# Patient Record
Sex: Male | Born: 1960
Health system: Southern US, Community
[De-identification: ages and names within clinical notes are randomized; demographics above are authoritative.]

## PROBLEM LIST (undated history)

## (undated) DIAGNOSIS — M5126 Other intervertebral disc displacement, lumbar region: Secondary | ICD-10-CM

## (undated) DIAGNOSIS — F329 Major depressive disorder, single episode, unspecified: Secondary | ICD-10-CM

## (undated) DIAGNOSIS — F419 Anxiety disorder, unspecified: Secondary | ICD-10-CM

## (undated) DIAGNOSIS — N189 Chronic kidney disease, unspecified: Secondary | ICD-10-CM

## (undated) DIAGNOSIS — I351 Nonrheumatic aortic (valve) insufficiency: Secondary | ICD-10-CM

## (undated) DIAGNOSIS — M1990 Unspecified osteoarthritis, unspecified site: Secondary | ICD-10-CM

## (undated) DIAGNOSIS — I7121 Aneurysm of the ascending aorta, without rupture: Secondary | ICD-10-CM

## (undated) DIAGNOSIS — M51369 Other intervertebral disc degeneration, lumbar region without mention of lumbar back pain or lower extremity pain: Secondary | ICD-10-CM

## (undated) DIAGNOSIS — F32A Depression, unspecified: Secondary | ICD-10-CM

## (undated) DIAGNOSIS — I712 Thoracic aortic aneurysm, without rupture: Secondary | ICD-10-CM

## (undated) DIAGNOSIS — G629 Polyneuropathy, unspecified: Secondary | ICD-10-CM

## (undated) DIAGNOSIS — L98499 Non-pressure chronic ulcer of skin of other sites with unspecified severity: Secondary | ICD-10-CM

## (undated) DIAGNOSIS — R200 Anesthesia of skin: Secondary | ICD-10-CM

## (undated) DIAGNOSIS — E119 Type 2 diabetes mellitus without complications: Secondary | ICD-10-CM

## (undated) DIAGNOSIS — E049 Nontoxic goiter, unspecified: Secondary | ICD-10-CM

## (undated) DIAGNOSIS — T7840XA Allergy, unspecified, initial encounter: Secondary | ICD-10-CM

## (undated) DIAGNOSIS — M5136 Other intervertebral disc degeneration, lumbar region: Secondary | ICD-10-CM

## (undated) DIAGNOSIS — S8991XA Unspecified injury of right lower leg, initial encounter: Secondary | ICD-10-CM

## (undated) DIAGNOSIS — I10 Essential (primary) hypertension: Secondary | ICD-10-CM

## (undated) DIAGNOSIS — C801 Malignant (primary) neoplasm, unspecified: Secondary | ICD-10-CM

## (undated) DIAGNOSIS — E785 Hyperlipidemia, unspecified: Secondary | ICD-10-CM

## (undated) DIAGNOSIS — G4730 Sleep apnea, unspecified: Secondary | ICD-10-CM

## (undated) DIAGNOSIS — G709 Myoneural disorder, unspecified: Secondary | ICD-10-CM

## (undated) DIAGNOSIS — N2889 Other specified disorders of kidney and ureter: Secondary | ICD-10-CM

## (undated) DIAGNOSIS — M79673 Pain in unspecified foot: Secondary | ICD-10-CM

## (undated) HISTORY — DX: Other intervertebral disc degeneration, lumbar region without mention of lumbar back pain or lower extremity pain: M51.369

## (undated) HISTORY — DX: Unspecified injury of right lower leg, initial encounter: S89.91XA

## (undated) HISTORY — DX: Thoracic aortic aneurysm, without rupture: I71.2

## (undated) HISTORY — DX: Nontoxic goiter, unspecified: E04.9

## (undated) HISTORY — DX: Myoneural disorder, unspecified: G70.9

## (undated) HISTORY — DX: Allergy, unspecified, initial encounter: T78.40XA

## (undated) HISTORY — DX: Type 2 diabetes mellitus without complications: E11.9

## (undated) HISTORY — DX: Hyperlipidemia, unspecified: E78.5

## (undated) HISTORY — DX: Polyneuropathy, unspecified: G62.9

## (undated) HISTORY — DX: Essential (primary) hypertension: I10

## (undated) HISTORY — DX: Other intervertebral disc degeneration, lumbar region: M51.36

## (undated) HISTORY — DX: Other intervertebral disc displacement, lumbar region: M51.26

## (undated) HISTORY — DX: Unspecified osteoarthritis, unspecified site: M19.90

## (undated) HISTORY — DX: Malignant (primary) neoplasm, unspecified: C80.1

## (undated) HISTORY — DX: Nonrheumatic aortic (valve) insufficiency: I35.1

## (undated) HISTORY — DX: Aneurysm of the ascending aorta, without rupture: I71.21

---

## 2002-06-01 ENCOUNTER — Encounter: Payer: Self-pay | Admitting: Family Medicine

## 2002-06-01 ENCOUNTER — Ambulatory Visit (HOSPITAL_COMMUNITY): Admission: RE | Admit: 2002-06-01 | Discharge: 2002-06-01 | Payer: Self-pay | Admitting: Family Medicine

## 2004-09-17 ENCOUNTER — Ambulatory Visit (HOSPITAL_COMMUNITY): Admission: RE | Admit: 2004-09-17 | Discharge: 2004-09-17 | Payer: Self-pay | Admitting: Family Medicine

## 2007-02-02 ENCOUNTER — Emergency Department (HOSPITAL_COMMUNITY): Admission: EM | Admit: 2007-02-02 | Discharge: 2007-02-02 | Payer: Self-pay | Admitting: Emergency Medicine

## 2007-08-19 ENCOUNTER — Ambulatory Visit (HOSPITAL_COMMUNITY): Admission: RE | Admit: 2007-08-19 | Discharge: 2007-08-19 | Payer: Self-pay | Admitting: Family Medicine

## 2012-10-15 ENCOUNTER — Ambulatory Visit (INDEPENDENT_AMBULATORY_CARE_PROVIDER_SITE_OTHER): Payer: 59 | Admitting: General Practice

## 2012-10-15 ENCOUNTER — Encounter: Payer: Self-pay | Admitting: General Practice

## 2012-10-15 VITALS — BP 162/110 | HR 98 | Temp 98.4°F | Ht 77.0 in | Wt 352.0 lb

## 2012-10-15 DIAGNOSIS — I10 Essential (primary) hypertension: Secondary | ICD-10-CM

## 2012-10-15 LAB — POCT CBC
Hemoglobin: 15.9 g/dL (ref 14.1–18.1)
MCH, POC: 33.1 pg — AB (ref 27–31.2)
MCV: 91.1 fL (ref 80–97)
RBC: 4.8 M/uL (ref 4.69–6.13)

## 2012-10-15 MED ORDER — LABETALOL HCL 200 MG PO TABS
200.0000 mg | ORAL_TABLET | Freq: Two times a day (BID) | ORAL | Status: DC
Start: 2012-10-15 — End: 2013-06-01

## 2012-10-15 MED ORDER — LISINOPRIL 40 MG PO TABS: 40.0000 mg | ORAL_TABLET | Freq: Every day | ORAL | Status: DC

## 2012-10-15 MED ORDER — DILTIAZEM HCL ER BEADS 120 MG PO CP24: 120.0000 mg | ORAL_CAPSULE | Freq: Every day | ORAL | Status: DC

## 2012-10-15 NOTE — Patient Instructions (Addendum)

## 2012-10-15 NOTE — Progress Notes (Signed)
  Subjective:    Patient ID: Anthony Logan, male    DOB: 29-Dec-1960, 52 y.o.   MRN: [id number]  HPI Patient presents today for blood pressure check. Reports having hypertension since being a teenager. Reports his hypertension has been controlled, but unsure what medications he was taking. He [facility] until three months ago. Reports he was prescribed diltiazem 120mg  one tablet daily, linsinopril 40 mg one tablet daily, and labetalol 200mg  twice a day through the [facility] clinic and blood pressure was controlled. He reports taking only the lisinopril for past month, he ran out of other two medications. He has had difficulty finding a physician since finding a job.Review of Systems  Constitutional: Negative for fever and chills.  HENT: Negative for ear pain and neck pain.   Eyes: Negative for pain.  Respiratory: Negative for chest tightness, shortness of breath and wheezing.   Cardiovascular: Negative for chest pain and palpitations.  Gastrointestinal: Negative for abdominal pain.  Genitourinary: Negative for hematuria and difficulty urinating.  Musculoskeletal: Negative for back pain.  Skin: Negative.   Neurological: Negative for dizziness, syncope, weakness and headaches.  Psychiatric/Behavioral: Negative.        Objective:   Physical Exam  Constitutional: He is oriented to person, place, and time. He appears well-developed and well-nourished.  HENT:  Head: Normocephalic and atraumatic.  Eyes: EOM are normal.  Neck: Normal range of motion. No thyromegaly present.  Cardiovascular: Normal rate, regular rhythm and normal heart sounds.   No murmur heard. Pulmonary/Chest: Effort normal and breath sounds normal. No respiratory distress. He exhibits no tenderness.  Abdominal: Soft. Bowel sounds are normal.  Obese abdomen   Neurological: He is alert and oriented to person, place, and time.  Skin: Skin is warm and dry.  Psychiatric: He has a normal mood and affect.           Assessment & Plan:  1. Essential hypertension, benign - POCT CBC - COMPLETE METABOLIC PANEL WITH GFR - lisinopril (PRINIVIL,ZESTRIL) 40 MG tablet; Take 1 tablet (40 mg total) by mouth daily.  Dispense: 30 tablet; Refill: 3 - labetalol (NORMODYNE) 200 MG tablet; Take 1 tablet (200 mg total) by mouth 2 (two) times daily.  Dispense: 60 tablet; Refill: 3 - diltiazem (TIAZAC) 120 MG 24 hr capsule; Take 1 capsule (120 mg total) by mouth daily.  Dispense: 30 capsule; Refill: 3 -discussed weight reduction -discussed healthy eating habits -discussed importance of taking medications daily and prevention of being without meds -Maintain blood pressure diary -RTO in one week for Blood pressure recheck -Patient verbalized understanding -Coralie Keens, FNP-C

## 2012-10-16 LAB — COMPLETE METABOLIC PANEL WITH GFR
BUN: 20 mg/dL (ref 6–23)
CO2: 30 mEq/L (ref 19–32)
Calcium: 9.5 mg/dL (ref 8.4–10.5)
Chloride: 104 mEq/L (ref 96–112)
Creat: 1.06 mg/dL (ref 0.50–1.35)
GFR, Est African American: >89 mL/min

## 2013-02-08 ENCOUNTER — Ambulatory Visit: Payer: Self-pay | Admitting: Cardiology

## 2013-02-15 ENCOUNTER — Ambulatory Visit (INDEPENDENT_AMBULATORY_CARE_PROVIDER_SITE_OTHER): Payer: Self-pay | Admitting: Cardiology

## 2013-02-15 ENCOUNTER — Encounter: Payer: Self-pay | Admitting: Cardiology

## 2013-02-15 VITALS — BP 139/85 | HR 65 | Ht 77.0 in | Wt 369.0 lb

## 2013-02-15 DIAGNOSIS — E1169 Type 2 diabetes mellitus with other specified complication: Secondary | ICD-10-CM | POA: Insufficient documentation

## 2013-02-15 DIAGNOSIS — I10 Essential (primary) hypertension: Secondary | ICD-10-CM

## 2013-02-15 DIAGNOSIS — R9431 Abnormal electrocardiogram [ECG] [EKG]: Secondary | ICD-10-CM

## 2013-02-15 DIAGNOSIS — E1159 Type 2 diabetes mellitus with other circulatory complications: Secondary | ICD-10-CM | POA: Insufficient documentation

## 2013-02-15 DIAGNOSIS — I152 Hypertension secondary to endocrine disorders: Secondary | ICD-10-CM | POA: Insufficient documentation

## 2013-02-15 DIAGNOSIS — E782 Mixed hyperlipidemia: Secondary | ICD-10-CM

## 2013-02-15 DIAGNOSIS — R072 Precordial pain: Secondary | ICD-10-CM | POA: Insufficient documentation
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
## 2013-02-15 NOTE — Patient Instructions (Addendum)
Your physician recommends that you schedule a follow-up appointment in: 6 MONTHS  Your physician has requested that you have an echocardiogram. Echocardiography is a painless test that uses sound waves to create images of your heart. It provides your doctor with information about the size and shape of your heart and how well your heart's chambers and valves are working. This procedure takes approximately one hour. There are no restrictions for this procedure.  WE WILL CALL YOU WITH YOUR TEST RESULTS/INSTRUCTIONS/NEXT STEPS ONCE RECEIVED BY THE PROVIDER

## 2013-02-15 NOTE — Assessment & Plan Note (Signed)
Presently resolved. Both typical and atypical features described. At this point our plan is to continue medical therapy and observation.

## 2013-02-15 NOTE — Assessment & Plan Note (Signed)
Patient back on labetalol and other standing regimen. Keep followup at the [facility]. Weight loss and diet would also be beneficial.

## 2013-02-15 NOTE — Assessment & Plan Note (Addendum)
The patient is also on omega-3 supplements and Trilipix. Recently started. Recent LDL under 100.

## 2013-02-15 NOTE — Assessment & Plan Note (Signed)
In conjunction with cardiac risk factor profile and transient chest pain symptoms, possibility of underlying CAD is certainly to be considered. We did discuss this today. He reports a reassuring stress test in 2006, report not available. Plan will be to go ahead and proceed with an echocardiogram to assess cardiac structure and function, see if there is an inferior wall motion abnormality to correspond with prior infarct pattern by ECG.

## 2013-02-22 ENCOUNTER — Encounter: Payer: Self-pay | Admitting: Cardiology

## 2013-02-23 NOTE — Addendum Note (Signed)
Addended by: Thompson Grayer on: 02/23/2013 10:16 AM   Modules accepted: Orders

## 2013-04-21 ENCOUNTER — Telehealth: Payer: Self-pay | Admitting: *Deleted

## 2013-04-21 NOTE — Telephone Encounter (Signed)
WUJ:[id number] pt was to be set up with [facility] discount/free services based on his income in order to have Echo performed as advised by Dr. Diona Browner  This nurse contacted pt to get an update, pt advised he has been trying for several months now to get [facility] assistance however he has not heard anything, pt did clarify he spoke to [facility] as advised by this nurse at last OV when the pt noted he could not afford the test advised, this nurse contacted Lubertha Basque to receive an update and was advised the pt has completed the paperwork however the dept rep that has taken over the processing has not processed at this time, Kathie Rhodes noted she will email the rep again today to put a rush on this pt paperwork per this pt did complete his part, this nurse called pt to apologize for delay and that someone will be contacting him within the next week, pt understood

## 2013-04-22 NOTE — Telephone Encounter (Signed)
Lubertha Basque called to advise the pt paperwork was processed and the pt has been approved for 100% of the [facility] discount, this nurse contacted the pt to advise he was approved however he will be contacted with further details concerning what the 100% detail entails at a later time however I can advise the echo can be scheduled at this time for the pt, advised once the apt has been scheduled we will contact him with the update, the pt understood, message sent to [facility] to schedule for pt

## 2013-04-25 NOTE — Telephone Encounter (Signed)
Pt apt was scheduled for 05-09-13 at 1pm for his echo, pt understood all instructions and is aware that we will call him with his results once completed, pt understood

## 2013-05-09 ENCOUNTER — Ambulatory Visit (HOSPITAL_COMMUNITY)
Admission: RE | Admit: 2013-05-09 | Discharge: 2013-05-09 | Disposition: A | Discharge status: Home / Self Care | Payer: Self-pay | Source: Ambulatory Visit | Attending: Cardiology | Admitting: Cardiology

## 2013-05-09 DIAGNOSIS — I10 Essential (primary) hypertension: Secondary | ICD-10-CM | POA: Insufficient documentation

## 2013-05-09 DIAGNOSIS — I517 Cardiomegaly: Secondary | ICD-10-CM

## 2013-05-09 DIAGNOSIS — Z87891 Personal history of nicotine dependence: Secondary | ICD-10-CM | POA: Insufficient documentation

## 2013-05-09 DIAGNOSIS — R072 Precordial pain: Secondary | ICD-10-CM

## 2013-05-09 DIAGNOSIS — R079 Chest pain, unspecified: Secondary | ICD-10-CM | POA: Insufficient documentation

## 2013-05-09 DIAGNOSIS — E782 Mixed hyperlipidemia: Secondary | ICD-10-CM

## 2013-05-09 DIAGNOSIS — R9431 Abnormal electrocardiogram [ECG] [EKG]: Secondary | ICD-10-CM

## 2013-05-09 DIAGNOSIS — E785 Hyperlipidemia, unspecified: Secondary | ICD-10-CM | POA: Insufficient documentation

## 2013-05-09 NOTE — Progress Notes (Signed)
*  PRELIMINARY RESULTS* Echocardiogram 2D Echocardiogram has been performed.  Anthony Logan 05/09/2013, 1:48 PM

## 2013-05-23 ENCOUNTER — Encounter (HOSPITAL_COMMUNITY): Payer: Self-pay | Admitting: Emergency Medicine

## 2013-05-23 ENCOUNTER — Emergency Department (HOSPITAL_COMMUNITY): Payer: Self-pay

## 2013-05-23 ENCOUNTER — Emergency Department (HOSPITAL_COMMUNITY)
Admission: EM | Admit: 2013-05-23 | Discharge: 2013-05-23 | Disposition: A | Discharge status: Home / Self Care | Payer: Self-pay | Attending: Emergency Medicine | Admitting: Emergency Medicine

## 2013-05-23 DIAGNOSIS — Z8639 Personal history of other endocrine, nutritional and metabolic disease: Secondary | ICD-10-CM | POA: Insufficient documentation

## 2013-05-23 DIAGNOSIS — J069 Acute upper respiratory infection, unspecified: Secondary | ICD-10-CM | POA: Insufficient documentation

## 2013-05-23 DIAGNOSIS — Z87891 Personal history of nicotine dependence: Secondary | ICD-10-CM | POA: Insufficient documentation

## 2013-05-23 DIAGNOSIS — I10 Essential (primary) hypertension: Secondary | ICD-10-CM | POA: Insufficient documentation

## 2013-05-23 DIAGNOSIS — Z791 Long term (current) use of non-steroidal anti-inflammatories (NSAID): Secondary | ICD-10-CM | POA: Insufficient documentation

## 2013-05-23 DIAGNOSIS — Z862 Personal history of diseases of the blood and blood-forming organs and certain disorders involving the immune mechanism: Secondary | ICD-10-CM | POA: Insufficient documentation

## 2013-05-23 DIAGNOSIS — J9801 Acute bronchospasm: Secondary | ICD-10-CM | POA: Insufficient documentation

## 2013-05-23 DIAGNOSIS — Z87828 Personal history of other (healed) physical injury and trauma: Secondary | ICD-10-CM | POA: Insufficient documentation

## 2013-05-23 DIAGNOSIS — Z7982 Long term (current) use of aspirin: Secondary | ICD-10-CM | POA: Insufficient documentation

## 2013-05-23 DIAGNOSIS — Z79899 Other long term (current) drug therapy: Secondary | ICD-10-CM | POA: Insufficient documentation

## 2013-05-23 MED ORDER — PREDNISONE 20 MG PO TABS: ORAL_TABLET | ORAL | Status: DC

## 2013-05-23 MED ORDER — PREDNISONE 50 MG PO TABS
60.0000 mg | ORAL_TABLET | Freq: Once | ORAL | Status: AC
  Administered 2013-05-23: 60 mg via ORAL
  Filled 2013-05-23 (×2): qty 1

## 2013-05-23 MED ORDER — ALBUTEROL SULFATE HFA 108 (90 BASE) MCG/ACT IN AERS
4.0000 | INHALATION_SPRAY | Freq: Once | RESPIRATORY_TRACT | Status: AC
  Administered 2013-05-23: 4 via RESPIRATORY_TRACT
  Filled 2013-05-23: qty 6.7

## 2013-05-23 MED ORDER — ALBUTEROL SULFATE HFA 108 (90 BASE) MCG/ACT IN AERS: 2.0000 | INHALATION_SPRAY | RESPIRATORY_TRACT | Status: DC | PRN

## 2013-05-23 NOTE — Discharge Instructions (Signed)
Bronchospasm, Adult A bronchospasm is when the tubes that carry air in and out of your lungs (airwarys) spasm or tighten. During a bronchospasm it is hard to breathe. This is because the airways get smaller. A bronchospasm can be triggered by:  Allergies. These may be to animals, pollen, food, or mold.  Infection. This is a common cause of bronchospasm.  Exercise.  Irritants. These include pollution, cigarette smoke, strong odors, aerosol sprays, and paint fumes.  Weather changes.  Stress.  Being emotional. HOME CARE   Always have a plan for getting help. Know when to call your doctor and local emergency services (911 in the [place] Know where you can get emergency care.  Only take medicines as told by your doctor.  If you were prescribed an inhaler or nebulizer machine, ask your doctor how to use it correctly. Always use a spacer with your inhaler if you were given one.  Stay calm during an attack. Try to relax and breathe more slowly.  Control your home environment:  Change your heating and air conditioning filter at least once a month.  Limit your use of fireplaces and wood stoves.  Do not  smoke. Do not  allow smoking in your home.  Avoid perfumes and fragrances.  Get rid of pests (such as roaches and mice) and their droppings.  Throw away plants if you see mold on them.  Keep your house clean and dust free.  Replace carpet with wood, tile, or vinyl flooring. Carpet can trap dander and dust.  Use allergy-proof pillows, mattress covers, and box spring covers.  Wash bed sheets and blankets every week in hot water. Dry them in a dryer.  Use blankets that are made of polyester or cotton.  Wash hands frequently. GET HELP IF:  You have muscle aches.  You have chest pain.  The thick spit you spit or cough up (sputum) changes from clear or white to yellow, green, gray, or bloody.  The thick spit you spit or cough up gets thicker.  There are problems that may be  related to the medicine you are given such as:  A rash.  Itching.  Swelling.  Trouble breathing. GET HELP RIGHT AWAY IF:  You feel you cannot breathe or catch your breath.  You cannot stop coughing.  Your treatment is not helping you breathe better. MAKE SURE YOU:   Understand these instructions.  Will watch your condition.  Will get help right away if you are not doing well or get worse. Document Released: 03/02/2009 Document Revised: 01/05/2013 Document Reviewed: 10/26/2012 [facility] Patient Information 2014 [place]. You appear to have an upper respiratory infection (URI). An upper respiratory tract infection, or cold, is a viral infection of the air passages leading to the lungs. It is contagious and can be spread to others, especially during the first 3 or 4 days. It cannot be cured by antibiotics or other medicines. RETURN IMMEDIATELY IF you develop worse shortness of breath, confusion or altered mental status, a new rash, become dizzy, faint, or poorly responsive, or are unable to be cared for at home.
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
## 2013-05-23 NOTE — ED Notes (Signed)
Cough, with sharp pains in chest with cough,  Dark green sputum. Recent treatment with amoxicillin for URI,  Chills, no fever

## 2013-06-01 ENCOUNTER — Ambulatory Visit (INDEPENDENT_AMBULATORY_CARE_PROVIDER_SITE_OTHER): Payer: Self-pay | Admitting: Cardiology

## 2013-06-01 ENCOUNTER — Encounter: Payer: Self-pay | Admitting: Cardiology

## 2013-06-01 ENCOUNTER — Encounter: Payer: Self-pay | Admitting: *Deleted

## 2013-06-01 VITALS — BP 166/98 | HR 62 | Ht 77.0 in | Wt 380.0 lb

## 2013-06-01 DIAGNOSIS — R9431 Abnormal electrocardiogram [ECG] [EKG]: Secondary | ICD-10-CM

## 2013-06-01 DIAGNOSIS — E782 Mixed hyperlipidemia: Secondary | ICD-10-CM

## 2013-06-01 DIAGNOSIS — I10 Essential (primary) hypertension: Secondary | ICD-10-CM

## 2013-06-01 DIAGNOSIS — I77810 Thoracic aortic ectasia: Secondary | ICD-10-CM

## 2013-06-01 NOTE — Assessment & Plan Note (Signed)
No recurring chest pain symptoms, image imited echocardiogram shows overall preserved LVEF without obvious cardiomyopathy. Continue medical therapy and observation.

## 2013-06-01 NOTE — Assessment & Plan Note (Signed)
Continuing with medication adjustments per primary care provider. Blood pressure is up this morning, but he has not yet taken his morning medications. Keep regular followup with Ms. McElroy.

## 2013-06-01 NOTE — Patient Instructions (Signed)
Your physician wants you to follow-up in: 6 MONTHS You will receive a reminder letter in the mail two months in advance. If you don't receive a letter, please call our office to schedule the follow-up appointment. 

## 2013-06-01 NOTE — Assessment & Plan Note (Signed)
He continues on omega-3 supplements and Trilipix. Recent lipid panel noted above.
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
## 2013-06-01 NOTE — Assessment & Plan Note (Signed)
Noted on recent screening echocardiogram, asymptomatic. At this point would focus on blood pressure control. Agree with beta blocker as a part of his regimen.

## 2014-01-05 ENCOUNTER — Other Ambulatory Visit (HOSPITAL_COMMUNITY): Payer: Self-pay | Admitting: Physician Assistant

## 2014-01-05 DIAGNOSIS — I10 Essential (primary) hypertension: Secondary | ICD-10-CM

## 2014-01-09 ENCOUNTER — Ambulatory Visit (HOSPITAL_COMMUNITY)
Admission: RE | Admit: 2014-01-09 | Discharge: 2014-01-09 | Disposition: A | Discharge status: Home / Self Care | Payer: Self-pay | Source: Ambulatory Visit | Attending: Physician Assistant | Admitting: Physician Assistant

## 2014-01-09 DIAGNOSIS — I10 Essential (primary) hypertension: Secondary | ICD-10-CM | POA: Insufficient documentation

## 2014-01-19 ENCOUNTER — Other Ambulatory Visit (HOSPITAL_COMMUNITY): Payer: Self-pay

## 2014-01-19 DIAGNOSIS — G4730 Sleep apnea, unspecified: Secondary | ICD-10-CM

## 2014-02-05 ENCOUNTER — Ambulatory Visit: Payer: Self-pay | Attending: Physician Assistant | Admitting: Sleep Medicine

## 2014-02-05 VITALS — Ht 77.0 in | Wt 370.0 lb

## 2014-02-05 DIAGNOSIS — G4733 Obstructive sleep apnea (adult) (pediatric): Secondary | ICD-10-CM | POA: Insufficient documentation

## 2014-02-10 NOTE — Sleep Study (Signed)
  Butler A. Merlene Laughter, MD     [email or web address]        NOCTURNAL POLYSOMNOGRAM    LOCATION: SLEEP LAB FACILITY: [facility]   PHYSICIAN: Morad Tal A. Merlene Laughter, M.D.   DATE OF STUDY: 02/05/2014.   REFERRING PHYSICIAN: Soyla Dryer, PA-C.  INDICATIONS: This is a 53 year old man who presents with snoring and witnessed apnea.  MEDICATIONS:  Prior to Admission medications   Medication Sig Start Date End Date Taking? Authorizing Provider  albuterol (PROVENTIL HFA;VENTOLIN HFA) 108 (90 BASE) MCG/ACT inhaler Inhale 2 puffs into the lungs every 2 (two) hours as needed for wheezing or shortness of breath (cough). 05/23/13   Babette Relic, MD  aspirin 81 MG tablet Take 81 mg by mouth daily.    Historical Provider, MD  Choline Fenofibrate (TRILIPIX) 135 MG capsule Take 135 mg by mouth daily.    Historical Provider, MD  diltiazem (TIAZAC) 120 MG 24 hr capsule Take 1 capsule (120 mg total) by mouth daily. 10/15/12   Erby Pian, FNP  fish oil-omega-3 fatty acids 1000 MG capsule Take 2 g by mouth daily.    Historical Provider, MD  furosemide (LASIX) 20 MG tablet Take 20 mg by mouth 2 (two) times daily.    Historical Provider, MD  lisinopril (PRINIVIL,ZESTRIL) 40 MG tablet Take 1 tablet (40 mg total) by mouth daily. 10/15/12   Erby Pian, FNP  meloxicam (MOBIC) 15 MG tablet Take 15 mg by mouth daily.    Historical Provider, MD  metoprolol (LOPRESSOR) 100 MG tablet Take 100 mg by mouth. 2 tabs bid    Historical Provider, MD  Multiple Vitamins-Iron (MULTIVITAMINS WITH IRON) TABS Take 1 tablet by mouth daily.    Historical Provider, MD  predniSONE (DELTASONE) 20 MG tablet 2 tabs po daily x 4 days 05/23/13   Babette Relic, MD      EPWORTH SLEEPINESS SCALE: 17.   BMI: 44.   ARCHITECTURAL SUMMARY: Total recording time was 449 minutes. Sleep efficiency 66 %. Sleep latency 53 minutes. REM latency 171 minutes. Stage NI 16 %, N2 50 % and N3 21 % and REM sleep 12 %.     RESPIRATORY DATA:  This is a split-night recording with the initial portion been a diagnostic in the second portion a titration recording. Baseline oxygen saturation is 95 %. The lowest saturation is 66 %. The diagnostic AHI is 77. The patient was placed on positive pressure starting at 5 and increase to 10. Optimal pressure is 10 with resolution of obstructive events and good tolerance.   LIMB MOVEMENT SUMMARY: PLM index 0.   ELECTROCARDIOGRAM SUMMARY: Average heart rate is 59 with no significant dysrhythmias observed.   IMPRESSION:  1. Severe obstructive sleep apnea syndrome which responds well to the CPAP of 10.  Thanks for this referral.  Julieana Eshleman A. Merlene Laughter, M.D. Diplomat, Tax adviser of Sleep Medicine.

## 2014-02-28 ENCOUNTER — Telehealth: Payer: Self-pay | Admitting: *Deleted

## 2014-02-28 NOTE — Telephone Encounter (Signed)
Received labs in Dr. McDowell folder. 

## 2014-03-06 ENCOUNTER — Encounter: Payer: Self-pay | Admitting: Cardiology

## 2014-03-16 ENCOUNTER — Ambulatory Visit: Payer: Self-pay | Admitting: Cardiology

## 2014-04-04 ENCOUNTER — Ambulatory Visit (INDEPENDENT_AMBULATORY_CARE_PROVIDER_SITE_OTHER): Payer: Self-pay | Admitting: Cardiology

## 2014-04-04 ENCOUNTER — Encounter: Payer: Self-pay | Admitting: Cardiology

## 2014-04-04 VITALS — BP 228/110 | HR 61 | Ht 77.0 in | Wt 387.0 lb

## 2014-04-04 DIAGNOSIS — I10 Essential (primary) hypertension: Secondary | ICD-10-CM

## 2014-04-04 DIAGNOSIS — I712 Thoracic aortic aneurysm, without rupture: Secondary | ICD-10-CM

## 2014-04-04 DIAGNOSIS — E782 Mixed hyperlipidemia: Secondary | ICD-10-CM

## 2014-04-04 DIAGNOSIS — I77810 Thoracic aortic ectasia: Secondary | ICD-10-CM

## 2014-04-04 MED ORDER — HYDRALAZINE HCL 25 MG PO TABS: 25.0000 mg | ORAL_TABLET | Freq: Three times a day (TID) | ORAL | Status: DC

## 2014-04-04 NOTE — Assessment & Plan Note (Signed)
Blood pressure not well controlled. We discussed diet and weight loss. Optimally, if he can receive regular CPAP treatment for sleep apnea, his blood pressure would probably also come under better control. We are starting hydralazine 25 mg 3 times a day, this can be up titrated further at subsequent visits in the [facility] for additional blood pressure control. No changes made to his other regimen.
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
## 2014-04-04 NOTE — Assessment & Plan Note (Signed)
Recent LDL 58.

## 2014-04-04 NOTE — Patient Instructions (Signed)
Your physician recommends that you schedule a follow-up appointment in: 6 months with Dr. Domenic Polite  Your physician has recommended you make the following change in your medication:   START HYDRALAZINE 25 [place] physician has requested that you have an echocardiogram. Echocardiography is a painless test that uses sound waves to create images of your heart. It provides your doctor with information about the size and shape of your heart and how well your heart's chambers and valves are working. This procedure takes approximately one hour. There are no restrictions for this procedure.  Thank you for choosing [facility]!!

## 2014-04-04 NOTE — Assessment & Plan Note (Signed)
Needs to focus on better blood pressure control. We will obtain a follow-up echocardiogram for his next visit in 6 months.

## 2014-04-06 ENCOUNTER — Ambulatory Visit (HOSPITAL_COMMUNITY)
Admission: RE | Admit: 2014-04-06 | Discharge: 2014-04-06 | Disposition: A | Discharge status: Home / Self Care | Payer: Self-pay | Source: Ambulatory Visit | Attending: Cardiology | Admitting: Cardiology

## 2014-04-06 DIAGNOSIS — I10 Essential (primary) hypertension: Secondary | ICD-10-CM | POA: Insufficient documentation

## 2014-04-06 DIAGNOSIS — G4733 Obstructive sleep apnea (adult) (pediatric): Secondary | ICD-10-CM | POA: Insufficient documentation

## 2014-04-06 DIAGNOSIS — Z87891 Personal history of nicotine dependence: Secondary | ICD-10-CM | POA: Insufficient documentation

## 2014-04-06 DIAGNOSIS — I359 Nonrheumatic aortic valve disorder, unspecified: Secondary | ICD-10-CM

## 2014-04-06 DIAGNOSIS — I77810 Thoracic aortic ectasia: Secondary | ICD-10-CM

## 2014-04-06 DIAGNOSIS — E785 Hyperlipidemia, unspecified: Secondary | ICD-10-CM | POA: Insufficient documentation

## 2014-04-06 DIAGNOSIS — I083 Combined rheumatic disorders of mitral, aortic and tricuspid valves: Secondary | ICD-10-CM | POA: Insufficient documentation

## 2014-04-06 MED ORDER — PERFLUTREN LIPID MICROSPHERE
1.0000 mL | INTRAVENOUS | Status: AC | PRN
  Administered 2014-04-06: 3 mL via INTRAVENOUS
  Administered 2014-04-06 (×3): 2 mL via INTRAVENOUS
  Filled 2014-04-06: qty 10

## 2014-04-06 NOTE — Progress Notes (Signed)
  Echocardiogram 2D Echocardiogram with Definity has been performed.  [place], [place] 04/06/2014, 12:53 PM

## 2014-09-27 ENCOUNTER — Ambulatory Visit (HOSPITAL_COMMUNITY)
Admission: RE | Admit: 2014-09-27 | Discharge: 2014-09-27 | Disposition: A | Discharge status: Home / Self Care | Payer: Self-pay | Source: Ambulatory Visit | Attending: Physician Assistant | Admitting: Physician Assistant

## 2014-09-27 ENCOUNTER — Other Ambulatory Visit (HOSPITAL_COMMUNITY): Payer: Self-pay | Admitting: Physician Assistant

## 2014-09-27 DIAGNOSIS — I517 Cardiomegaly: Secondary | ICD-10-CM | POA: Insufficient documentation

## 2014-09-27 DIAGNOSIS — R609 Edema, unspecified: Secondary | ICD-10-CM | POA: Insufficient documentation

## 2014-09-27 DIAGNOSIS — R0602 Shortness of breath: Secondary | ICD-10-CM | POA: Insufficient documentation

## 2014-10-27 ENCOUNTER — Encounter: Payer: Self-pay | Admitting: Cardiology

## 2014-10-27 ENCOUNTER — Ambulatory Visit (INDEPENDENT_AMBULATORY_CARE_PROVIDER_SITE_OTHER): Payer: Self-pay | Admitting: Cardiology

## 2014-10-27 VITALS — BP 148/86 | HR 62 | Ht 77.0 in

## 2014-10-27 DIAGNOSIS — R600 Localized edema: Secondary | ICD-10-CM

## 2014-10-27 DIAGNOSIS — I77819 Aortic ectasia, unspecified site: Secondary | ICD-10-CM

## 2014-10-27 DIAGNOSIS — I10 Essential (primary) hypertension: Secondary | ICD-10-CM
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
## 2014-10-27 NOTE — Patient Instructions (Signed)
Your physician wants you to follow-up in: 6 months with Dr Ferne Reus will receive a reminder letter in the mail two months in advance. If you don't receive a letter, please call our office to schedule the follow-up appointment.    Your physician recommends that you continue on your current medications as directed. Please refer to the Current Medication list given to you today.    Your physician has requested that you have an echocardiogram JUST BEFORE FOLLOW UP VISIT IN [place]. Echocardiography is a painless test that uses sound waves to create images of your heart. It provides your doctor with information about the size and shape of your heart and how well your heart's chambers and valves are working. This procedure takes approximately one hour. There are no restrictions for this procedure.    Thank you for choosing [facility] !

## 2015-01-06 ENCOUNTER — Encounter (HOSPITAL_COMMUNITY): Payer: Self-pay | Admitting: Cardiology

## 2015-01-06 ENCOUNTER — Emergency Department (HOSPITAL_COMMUNITY)
Admission: EM | Admit: 2015-01-06 | Discharge: 2015-01-06 | Disposition: A | Discharge status: Home / Self Care | Payer: Self-pay | Attending: Emergency Medicine | Admitting: Emergency Medicine

## 2015-01-06 DIAGNOSIS — Z7982 Long term (current) use of aspirin: Secondary | ICD-10-CM | POA: Insufficient documentation

## 2015-01-06 DIAGNOSIS — L03211 Cellulitis of face: Secondary | ICD-10-CM | POA: Insufficient documentation

## 2015-01-06 DIAGNOSIS — Z87891 Personal history of nicotine dependence: Secondary | ICD-10-CM | POA: Insufficient documentation

## 2015-01-06 DIAGNOSIS — I10 Essential (primary) hypertension: Secondary | ICD-10-CM | POA: Insufficient documentation

## 2015-01-06 DIAGNOSIS — Z79899 Other long term (current) drug therapy: Secondary | ICD-10-CM | POA: Insufficient documentation

## 2015-01-06 DIAGNOSIS — Z87828 Personal history of other (healed) physical injury and trauma: Secondary | ICD-10-CM | POA: Insufficient documentation

## 2015-01-06 DIAGNOSIS — Z8639 Personal history of other endocrine, nutritional and metabolic disease: Secondary | ICD-10-CM | POA: Insufficient documentation

## 2015-01-06 MED ORDER — CEPHALEXIN 500 MG PO CAPS: 500.0000 mg | ORAL_CAPSULE | Freq: Four times a day (QID) | ORAL | Status: DC
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
## 2015-01-06 NOTE — ED Notes (Signed)
Pt reports red swollen area to left side of forehead that started occurring 4 days ago. Pt is unaware of being bitten by anything or having any injury to site. Redness and swelling extends to left eye and underneath bilateral eyes. Pt says the redness and swelling has only gotten worse since it started.

## 2015-01-06 NOTE — ED Notes (Signed)
Boil above left eye.  States his eye is starting to swell now.

## 2015-01-09 ENCOUNTER — Encounter (HOSPITAL_COMMUNITY): Payer: Self-pay | Admitting: *Deleted

## 2015-01-09 ENCOUNTER — Emergency Department (HOSPITAL_COMMUNITY)
Admission: EM | Admit: 2015-01-09 | Discharge: 2015-01-09 | Disposition: A | Discharge status: Home / Self Care | Payer: Self-pay | Attending: Emergency Medicine | Admitting: Emergency Medicine

## 2015-01-09 DIAGNOSIS — Z792 Long term (current) use of antibiotics: Secondary | ICD-10-CM | POA: Insufficient documentation

## 2015-01-09 DIAGNOSIS — Z791 Long term (current) use of non-steroidal anti-inflammatories (NSAID): Secondary | ICD-10-CM | POA: Insufficient documentation

## 2015-01-09 DIAGNOSIS — Z7982 Long term (current) use of aspirin: Secondary | ICD-10-CM | POA: Insufficient documentation

## 2015-01-09 DIAGNOSIS — Z87828 Personal history of other (healed) physical injury and trauma: Secondary | ICD-10-CM | POA: Insufficient documentation

## 2015-01-09 DIAGNOSIS — T798XXA Other early complications of trauma, initial encounter: Secondary | ICD-10-CM

## 2015-01-09 DIAGNOSIS — Z87891 Personal history of nicotine dependence: Secondary | ICD-10-CM | POA: Insufficient documentation

## 2015-01-09 DIAGNOSIS — E785 Hyperlipidemia, unspecified: Secondary | ICD-10-CM | POA: Insufficient documentation

## 2015-01-09 DIAGNOSIS — I10 Essential (primary) hypertension: Secondary | ICD-10-CM | POA: Insufficient documentation

## 2015-01-09 DIAGNOSIS — L089 Local infection of the skin and subcutaneous tissue, unspecified: Secondary | ICD-10-CM | POA: Insufficient documentation

## 2015-01-09 DIAGNOSIS — Z79899 Other long term (current) drug therapy: Secondary | ICD-10-CM | POA: Insufficient documentation

## 2015-01-09 MED ORDER — SULFAMETHOXAZOLE-TRIMETHOPRIM 800-160 MG PO TABS: 1.0000 | ORAL_TABLET | Freq: Two times a day (BID) | ORAL | Status: AC

## 2015-01-09 NOTE — Discharge Instructions (Signed)
Use moist heat on the sore area every hour, for 30 minutes while awake. Return here if needed for increased swelling, pain, nausea, vomiting or inability to take the medications.   Wound Infection A wound infection happens when a type of germ (bacteria) starts growing in the wound. In some cases, this can cause the wound to break open. If cared for properly, the infected wound will heal from the inside to the outside. Wound infections need treatment. CAUSES An infection is caused by bacteria growing in the wound.  SYMPTOMS   Increase in redness, swelling, or pain at the wound site.  Increase in drainage at the wound site.  Wound or bandage (dressing) starts to smell bad.  Fever.  Feeling tired or fatigued.  Pus draining from the wound. TREATMENT  Your health care provider will prescribe antibiotic medicine. The wound infection should improve within 24 to 48 hours. Any redness around the wound should stop spreading and the wound should be less painful.  HOME CARE INSTRUCTIONS   Only take over-the-counter or prescription medicines for pain, discomfort, or fever as directed by your health care provider.  Take your antibiotics as directed. Finish them even if you start to feel better.  Gently wash the area with mild soap and water 2 times a day, or as directed. Rinse off the soap. Pat the area dry with a clean towel. Do not rub the wound. This may cause bleeding.  Follow your health care provider's instructions for how often you need to change the dressing.  Apply ointment and a dressing to the wound as directed.  If the dressing sticks, moisten it with soapy water and gently remove it.  Change the bandage right away if it becomes wet, dirty, or develops a bad smell.  Take showers. Do not take tub baths, swim, or do anything that may soak the wound until it is healed.  Avoid exercises that make you sweat heavily.  Use anti-itch medicine as directed by your health care provider.  The wound may itch when it is healing. Do not pick or scratch at the wound.  Follow up with your health care provider to get your wound rechecked as directed. SEEK MEDICAL CARE IF:  You have an increase in swelling, pain, or redness around the wound.  You have an increase in the amount of pus coming from the wound.  There is a bad smell coming from the wound.  More of the wound breaks open.  You have a fever. MAKE SURE YOU:   Understand these instructions.  Will watch your condition.  Will get help right away if you are not doing well or get worse. Document Released: 02/01/2003 Document Revised: 05/10/2013 Document Reviewed: 09/08/2010 [facility] Patient Information 2015 [place], [place]. This information is not intended to replace advice given to you by your health care provider. Make sure you discuss any questions you have with your health care provider.

## 2015-01-09 NOTE — ED Notes (Signed)
Pt comes in for a re-check of cellulitis. Pt was seen here on 8/20 and given antibiotics. Pt states he feels he is getting no better. He woke up this morning with increased swelling. NAD noted. Pt airway is patent.
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
## 2015-03-27 ENCOUNTER — Encounter: Payer: Self-pay | Admitting: Physician Assistant

## 2015-03-27 ENCOUNTER — Ambulatory Visit: Payer: Self-pay | Admitting: Physician Assistant

## 2015-03-27 VITALS — BP 160/110 | HR 50 | Temp 97.3°F | Ht 77.0 in | Wt 367.5 lb

## 2015-03-27 DIAGNOSIS — R809 Proteinuria, unspecified: Secondary | ICD-10-CM | POA: Insufficient documentation

## 2015-03-27 DIAGNOSIS — I10 Essential (primary) hypertension: Secondary | ICD-10-CM

## 2015-03-27 DIAGNOSIS — E782 Mixed hyperlipidemia: Secondary | ICD-10-CM | POA: Insufficient documentation

## 2015-03-27 DIAGNOSIS — E785 Hyperlipidemia, unspecified: Secondary | ICD-10-CM

## 2015-03-27 NOTE — Progress Notes (Signed)
BP 160/110 mmHg  Pulse 50  Temp(Src) 97.3 F (36.3 C)  Ht 6\' 5"  (1.956 m)  Wt 367 lb 8 oz (166.697 kg)  BMI 43.57 kg/m2  SpO2 98%   Subjective:    Patient ID: Anthony Logan, male    DOB: 14-May-1961, 54 y.o.   MRN: [id number]  HPI: Anthony Logan is a 54 y.o. male presenting on 03/27/2015 for Hypertension   HPI Chief Complaint  Patient presents with  . Hypertension    pt states he feels pretty good.   Pt has separated from his wife and is now living with his parents again. He says he is not depressed and actually feels relieved. Pt has appt with neprhologist  Next week. Pt has appt for echo in december  Relevant past medical, surgical, family and social history reviewed and updated as indicated. Interim medical history since our last visit reviewed. Allergies and medications reviewed and updated.  Current outpatient prescriptions:  .  aspirin 81 MG tablet, Take 81 mg by mouth daily., Disp: , Rfl:  .  Cholecalciferol (VITAMIN D-3 PO), Take 1,000 Units by mouth daily., Disp: , Rfl:  .  fish oil-omega-3 fatty acids 1000 MG capsule, Take 2 g by mouth daily. 4 tabs daily, Disp: , Rfl:  .  furosemide (LASIX) 20 MG tablet, Take 60 mg by mouth daily. , Disp: , Rfl:  .  lisinopril (PRINIVIL,ZESTRIL) 40 MG tablet, Take 1 tablet (40 mg total) by mouth daily., Disp: 30 tablet, Rfl: 3 .  metoprolol (LOPRESSOR) 100 MG tablet, Take 200 mg by mouth 2 (two) times daily. 2 tabs bid, Disp: , Rfl:  .  Multiple Vitamin (MULTI VITAMIN DAILY PO), Take by mouth daily., Disp: , Rfl:  .  naproxen sodium (ANAPROX) 220 MG tablet, Take 220 mg by mouth daily as needed. , Disp: , Rfl:  .  potassium gluconate 595 MG TABS tablet, Take 595 mg by mouth daily. 2 tabs daily, Disp: , Rfl:    Review of Systems  Constitutional: Negative for fever, chills, diaphoresis, appetite change, fatigue and unexpected weight change.  HENT: Negative for congestion, dental problem, drooling, ear pain, facial swelling, hearing  loss, mouth sores, sneezing, sore throat, trouble swallowing and voice change.   Eyes: Negative for pain, discharge, redness, itching and visual disturbance.  Respiratory: Negative for cough, choking, shortness of breath and wheezing.   Cardiovascular: Negative for chest pain, palpitations and leg swelling.  Gastrointestinal: Negative for vomiting, abdominal pain, diarrhea, constipation and blood in stool.  Endocrine: Negative for cold intolerance, heat intolerance and polydipsia.  Genitourinary: Negative for dysuria, hematuria and decreased urine volume.  Musculoskeletal: Positive for arthralgias. Negative for back pain and gait problem.  Skin: Negative for rash.  Allergic/Immunologic: Negative for environmental allergies.  Neurological: Negative for seizures, syncope, light-headedness and headaches.  Hematological: Negative for adenopathy.  Psychiatric/Behavioral: Negative for suicidal ideas, dysphoric mood and agitation. The patient is not nervous/anxious.     Per HPI unless specifically indicated above     Objective:    BP 160/110 mmHg  Pulse 50  Temp(Src) 97.3 F (36.3 C)  Ht 6\' 5"  (1.956 m)  Wt 367 lb 8 oz (166.697 kg)  BMI 43.57 kg/m2  SpO2 98%  Wt Readings from Last 3 Encounters:  03/27/15 367 lb 8 oz (166.697 kg)  01/09/15 370 lb (167.831 kg)  01/06/15 372 lb (168.738 kg)    Physical Exam  Constitutional: He is oriented to person, place, and time. He appears well-developed  and well-nourished.  HENT:  Head: Normocephalic and atraumatic.  Neck: Neck supple.  Cardiovascular: Normal rate and regular rhythm.   Pulmonary/Chest: Effort normal and breath sounds normal. He has no wheezes.  Abdominal: Soft. Bowel sounds are normal. There is no tenderness.  obese  Musculoskeletal: He exhibits edema (trace to 1+ BLE edema).  Lymphadenopathy:    He has no cervical adenopathy.  Neurological: He is alert and oriented to person, place, and time.  Skin: Skin is warm and dry.   Psychiatric: He has a normal mood and affect. His behavior is normal. Thought content normal.  Vitals reviewed.       Assessment & Plan:    Encounter Diagnoses  Name Primary?  . Essential hypertension, benign Yes  . Proteinuria   . Hyperlipemia   . Morbid obesity, unspecified obesity type ([place])    No changes today. Cont with specialists. F/u here 3 mo. rto sooner prn

## 2015-03-28 ENCOUNTER — Other Ambulatory Visit: Payer: Self-pay | Admitting: Physician Assistant

## 2015-03-28 DIAGNOSIS — R809 Proteinuria, unspecified: Secondary | ICD-10-CM

## 2015-03-28 DIAGNOSIS — N183 Chronic kidney disease, stage 3 unspecified: Secondary | ICD-10-CM

## 2015-03-28 DIAGNOSIS — D649 Anemia, unspecified: Secondary | ICD-10-CM

## 2015-03-28 DIAGNOSIS — I10 Essential (primary) hypertension: Secondary | ICD-10-CM

## 2015-03-28 DIAGNOSIS — E559 Vitamin D deficiency, unspecified: Secondary | ICD-10-CM

## 2015-03-28 DIAGNOSIS — Z79899 Other long term (current) drug therapy: Secondary | ICD-10-CM

## 2015-03-30 LAB — HEMOGLOBIN: HEMOGLOBIN: 15.2 g/dL (ref 13.0–17.0)

## 2015-03-30 LAB — RENAL FUNCTION PANEL
Albumin: 4.1 g/dL (ref 3.6–5.1)
BUN: 12 mg/dL (ref 7–25)
CALCIUM: 9 mg/dL (ref 8.6–10.3)
CHLORIDE: 103 mmol/L (ref 98–110)
CO2: 29 mmol/L (ref 20–31)
Creat: 0.7 mg/dL (ref 0.70–1.33)
Glucose, Bld: 117 mg/dL — ABNORMAL HIGH (ref 65–99)
PHOSPHORUS: 3 mg/dL (ref 2.5–4.5)
POTASSIUM: 4.3 mmol/L (ref 3.5–5.3)
SODIUM: 144 mmol/L (ref 135–146)

## 2015-03-31 LAB — HEMATOCRIT: HCT: 46.2 % (ref 39.0–52.0)

## 2015-03-31 LAB — PROTEIN / CREATININE RATIO, URINE
CREATININE, URINE: 210 mg/dL (ref 20–370)
Protein Creatinine Ratio: 305 mg/g creat — ABNORMAL HIGH (ref 22–128)
Total Protein, Urine: 64 mg/dL — ABNORMAL HIGH (ref 5–25)

## 2015-03-31 LAB — VITAMIN D 25 HYDROXY (VIT D DEFICIENCY, FRACTURES): Vit D, 25-Hydroxy: 33 ng/mL (ref 30–100)

## 2015-04-19 ENCOUNTER — Telehealth: Payer: Self-pay | Admitting: Cardiology

## 2015-04-19 NOTE — Telephone Encounter (Signed)
Spoke w/pt.  He will be self pay for 04-23-15 echo.  Pt states denied for [employer] Financial Aid.  He is also applying for Medicaid.

## 2015-04-23 ENCOUNTER — Ambulatory Visit (HOSPITAL_COMMUNITY)
Admission: RE | Admit: 2015-04-23 | Discharge: 2015-04-23 | Disposition: A | Discharge status: Home / Self Care | Payer: Medicaid Other | Source: Ambulatory Visit | Attending: Cardiology | Admitting: Cardiology

## 2015-04-23 DIAGNOSIS — I77819 Aortic ectasia, unspecified site: Secondary | ICD-10-CM | POA: Diagnosis present

## 2015-04-23 DIAGNOSIS — R079 Chest pain, unspecified: Secondary | ICD-10-CM | POA: Diagnosis not present

## 2015-04-23 DIAGNOSIS — I10 Essential (primary) hypertension: Secondary | ICD-10-CM | POA: Insufficient documentation

## 2015-06-04 ENCOUNTER — Encounter (HOSPITAL_COMMUNITY): Payer: Self-pay | Admitting: *Deleted

## 2015-06-04 ENCOUNTER — Emergency Department (HOSPITAL_COMMUNITY)
Admission: EM | Admit: 2015-06-04 | Discharge: 2015-06-04 | Disposition: A | Discharge status: Home / Self Care | Payer: Medicaid Other | Attending: Emergency Medicine | Admitting: Emergency Medicine

## 2015-06-04 ENCOUNTER — Emergency Department (HOSPITAL_COMMUNITY): Payer: Medicaid Other

## 2015-06-04 DIAGNOSIS — I10 Essential (primary) hypertension: Secondary | ICD-10-CM | POA: Insufficient documentation

## 2015-06-04 DIAGNOSIS — Z87828 Personal history of other (healed) physical injury and trauma: Secondary | ICD-10-CM | POA: Diagnosis not present

## 2015-06-04 DIAGNOSIS — R109 Unspecified abdominal pain: Secondary | ICD-10-CM | POA: Diagnosis not present

## 2015-06-04 DIAGNOSIS — Z87891 Personal history of nicotine dependence: Secondary | ICD-10-CM | POA: Insufficient documentation

## 2015-06-04 DIAGNOSIS — Z79899 Other long term (current) drug therapy: Secondary | ICD-10-CM | POA: Insufficient documentation

## 2015-06-04 DIAGNOSIS — N2889 Other specified disorders of kidney and ureter: Secondary | ICD-10-CM | POA: Diagnosis not present

## 2015-06-04 DIAGNOSIS — Z7982 Long term (current) use of aspirin: Secondary | ICD-10-CM | POA: Diagnosis not present

## 2015-06-04 DIAGNOSIS — E785 Hyperlipidemia, unspecified: Secondary | ICD-10-CM | POA: Insufficient documentation

## 2015-06-04 DIAGNOSIS — M545 Low back pain: Secondary | ICD-10-CM | POA: Diagnosis present

## 2015-06-04 LAB — URINALYSIS, ROUTINE W REFLEX MICROSCOPIC
Bilirubin Urine: NEGATIVE
Glucose, UA: NEGATIVE mg/dL
Hgb urine dipstick: NEGATIVE
KETONES UR: NEGATIVE mg/dL
LEUKOCYTES UA: NEGATIVE
NITRITE: NEGATIVE
PH: 5 (ref 5.0–8.0)
Protein, ur: NEGATIVE mg/dL
Specific Gravity, Urine: 1.02 (ref 1.005–1.030)

## 2015-06-04 MED ORDER — OXYCODONE-ACETAMINOPHEN 5-325 MG PO TABS: 2.0000 | ORAL_TABLET | ORAL | Status: DC | PRN

## 2015-06-04 MED ORDER — OXYCODONE-ACETAMINOPHEN 5-325 MG PO TABS
2.0000 | ORAL_TABLET | Freq: Once | ORAL | Status: AC
  Administered 2015-06-04: 2 via ORAL
  Filled 2015-06-04: qty 2

## 2015-06-04 NOTE — Discharge Instructions (Signed)
Flank Pain Flank pain refers to pain that is located on the side of the body between the upper abdomen and the back. The pain may occur over a short period of time (acute) or may be long-term or reoccurring (chronic). It may be mild or severe. Flank pain can be caused by many things. CAUSES  Some of the more common causes of flank pain include:  Muscle strains.   Muscle spasms.   A disease of your spine (vertebral disk disease).   A lung infection (pneumonia).   Fluid around your lungs (pulmonary edema).   A kidney infection.   Kidney stones.   A very painful skin rash caused by the chickenpox virus (shingles).   Gallbladder disease.  [place] care will depend on the cause of your pain. In general,  Rest as directed by your caregiver.  Drink enough fluids to keep your urine clear or pale yellow.  Only take over-the-counter or prescription medicines as directed by your caregiver. Some medicines may help relieve the pain.  Tell your caregiver about any changes in your pain.  Follow up with your caregiver as directed. SEEK IMMEDIATE MEDICAL CARE IF:   Your pain is not controlled with medicine.   You have new or worsening symptoms.  Your pain increases.   You have abdominal pain.   You have shortness of breath.   You have persistent nausea or vomiting.   You have swelling in your abdomen.   You feel faint or pass out.   You have blood in your urine.  You have a fever or persistent symptoms for more than 2-3 days.  You have a fever and your symptoms suddenly get worse. MAKE SURE YOU:   Understand these instructions.  Will watch your condition.  Will get help right away if you are not doing well or get worse.   This information is not intended to replace advice given to you by your health care provider. Make sure you discuss any questions you have with your health care provider.   Document Released: 06/26/2005 Document  Revised: 01/28/2012 Document Reviewed: 12/18/2011 Elsevier Interactive Patient Education 2016 [employer]. Renal Mass A renal mass is a growth in the kidney. Some masses are harmful and may cause cancer. Others are harmless. A renal mass may be solid or filled with fluid. Those that are filled with fluid are called cysts. CAUSES Usually, the cause of a renal mass is unknown. However, certain types of cancers and infections can cause a renal mass.  SIGNS AND SYMPTOMS Symptoms may include:  Blood in the urine.  Pain in the side or back (flank pain).  Feeling full soon after eating.  Weight loss.  Swelling in the abdomen. Some renal masses do not cause symptoms. DIAGNOSIS A renal mass may be found with a CT scan, ultrasound, or MRI of your abdomen.  TREATMENT Treatment will depend on the type of renal mass.  If the renal mass is a cyst that is not causing problems, you will not need treatment.  If the renal mass is a cyst that is causing problems, it may need to be drained during a type of surgery called laparoscopic surgery.  If the renal mass is solid, it may need to be removed with a surgery to your abdomen.  If the renal mass is caused by kidney cancer, you may need surgery to remove all or part of your kidney. You may need to see your health care provider once or twice  a year to have CT scans and ultrasounds done. Having these tests will allow your health care provider to see if your renal mass has changed or gotten bigger. HOME CARE INSTRUCTIONS What you need to do at home will depend on the type of renal mass that you have. The treatment you had also will make a difference. Follow the instructions your health care provider gives you. In general:  Keep all follow-up visits as directed by your health care provider.  Take medicines only as directed by your health care provider. SEEK MEDICAL CARE IF:  You have abdominal pain.  You have flank pain.  You have a  fever. SEEK IMMEDIATE MEDICAL CARE IF:   Your pain gets worse.  There is blood in your urine.  You cannot urinate.  You have chest pain.  You have trouble breathing. MAKE SURE YOU:  Understand these instructions.  Will watch your condition.  Will get help right away if you are not doing well or get worse.   This information is not intended to replace advice given to you by your health care provider. Make sure you discuss any questions you have with your health care provider.   Document Released: 11/30/2013 Document Reviewed: 11/30/2013 Elsevier Interactive Patient Education [employer].
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
## 2015-06-04 NOTE — ED Notes (Signed)
Pt comes in with lower back pain starting 3-4 days ago worsening into pain shooting down his left leg with intermittent tingling and numbness. NAD noted. Pt is able to ambulate.

## 2015-06-05 ENCOUNTER — Other Ambulatory Visit: Payer: Self-pay | Admitting: Urology

## 2015-06-05 ENCOUNTER — Ambulatory Visit (INDEPENDENT_AMBULATORY_CARE_PROVIDER_SITE_OTHER): Payer: Self-pay | Admitting: Urology

## 2015-06-05 ENCOUNTER — Encounter (HOSPITAL_COMMUNITY): Payer: Self-pay | Admitting: Emergency Medicine

## 2015-06-05 ENCOUNTER — Other Ambulatory Visit: Payer: Self-pay | Admitting: Physician Assistant

## 2015-06-05 ENCOUNTER — Emergency Department (HOSPITAL_COMMUNITY): Payer: Medicaid Other

## 2015-06-05 ENCOUNTER — Ambulatory Visit: Payer: Self-pay | Admitting: Physician Assistant

## 2015-06-05 ENCOUNTER — Emergency Department (HOSPITAL_COMMUNITY): Admission: EM | Admit: 2015-06-05 | Payer: Self-pay | Source: Home / Self Care

## 2015-06-05 ENCOUNTER — Emergency Department (HOSPITAL_COMMUNITY)
Admission: EM | Admit: 2015-06-05 | Discharge: 2015-06-05 | Disposition: A | Discharge status: Home / Self Care | Payer: Medicaid Other | Attending: Emergency Medicine | Admitting: Emergency Medicine

## 2015-06-05 ENCOUNTER — Encounter: Payer: Self-pay | Admitting: Physician Assistant

## 2015-06-05 VITALS — BP 196/98 | HR 53 | Temp 97.3°F | Ht 77.0 in | Wt 368.0 lb

## 2015-06-05 DIAGNOSIS — N2889 Other specified disorders of kidney and ureter: Secondary | ICD-10-CM

## 2015-06-05 DIAGNOSIS — I10 Essential (primary) hypertension: Secondary | ICD-10-CM | POA: Diagnosis not present

## 2015-06-05 DIAGNOSIS — R29898 Other symptoms and signs involving the musculoskeletal system: Secondary | ICD-10-CM

## 2015-06-05 DIAGNOSIS — M5136 Other intervertebral disc degeneration, lumbar region: Secondary | ICD-10-CM

## 2015-06-05 DIAGNOSIS — M5126 Other intervertebral disc displacement, lumbar region: Secondary | ICD-10-CM

## 2015-06-05 DIAGNOSIS — M21379 Foot drop, unspecified foot: Secondary | ICD-10-CM | POA: Diagnosis not present

## 2015-06-05 DIAGNOSIS — M51369 Other intervertebral disc degeneration, lumbar region without mention of lumbar back pain or lower extremity pain: Secondary | ICD-10-CM

## 2015-06-05 DIAGNOSIS — M21372 Foot drop, left foot: Secondary | ICD-10-CM | POA: Diagnosis present

## 2015-06-05 DIAGNOSIS — N289 Disorder of kidney and ureter, unspecified: Secondary | ICD-10-CM | POA: Diagnosis not present

## 2015-06-05 DIAGNOSIS — M5442 Lumbago with sciatica, left side: Secondary | ICD-10-CM | POA: Diagnosis not present

## 2015-06-05 LAB — COMPREHENSIVE METABOLIC PANEL
ALBUMIN: 4.3 g/dL (ref 3.5–5.0)
ALK PHOS: 68 U/L (ref 38–126)
ALT: 27 U/L (ref 17–63)
AST: 27 U/L (ref 15–41)
Anion gap: 9 (ref 5–15)
BILIRUBIN TOTAL: 0.9 mg/dL (ref 0.3–1.2)
BUN: 18 mg/dL (ref 6–20)
CALCIUM: 9.2 mg/dL (ref 8.9–10.3)
CO2: 32 mmol/L (ref 22–32)
Chloride: 100 mmol/L — ABNORMAL LOW (ref 101–111)
Creatinine, Ser: 0.7 mg/dL (ref 0.61–1.24)
GFR calc Af Amer: >60 mL/min (ref >60)
GFR calc non Af Amer: >60 mL/min (ref >60)
GLUCOSE: 115 mg/dL — AB (ref 65–99)
Potassium: 3.8 mmol/L (ref 3.5–5.1)
Sodium: 141 mmol/L (ref 135–145)
TOTAL PROTEIN: 7.7 g/dL (ref 6.5–8.1)

## 2015-06-05 LAB — CBC WITH DIFFERENTIAL/PLATELET
BASOS ABS: 0 10*3/uL (ref 0.0–0.1)
BASOS PCT: 0 %
Eosinophils Absolute: 0.2 10*3/uL (ref 0.0–0.7)
Eosinophils Relative: 2 %
HEMATOCRIT: 47.5 % (ref 39.0–52.0)
HEMOGLOBIN: 16.1 g/dL (ref 13.0–17.0)
Lymphocytes Relative: 28 %
Lymphs Abs: 2.9 10*3/uL (ref 0.7–4.0)
MCH: 31.8 pg (ref 26.0–34.0)
MCHC: 33.9 g/dL (ref 30.0–36.0)
MCV: 93.9 fL (ref 78.0–100.0)
Monocytes Absolute: 0.9 10*3/uL (ref 0.1–1.0)
Monocytes Relative: 8 %
NEUTROS ABS: 6.4 10*3/uL (ref 1.7–7.7)
Neutrophils Relative %: 62 %
Platelets: 270 10*3/uL (ref 150–400)
RBC: 5.06 MIL/uL (ref 4.22–5.81)
RDW: 12.5 % (ref 11.5–15.5)
WBC: 10.5 10*3/uL (ref 4.0–10.5)

## 2015-06-05 MED ORDER — DEXAMETHASONE 4 MG PO TABS: 10.0000 mg | ORAL_TABLET | Freq: Once | ORAL | Status: DC

## 2015-06-05 MED ORDER — METHYLPREDNISOLONE 4 MG PO TBPK: ORAL_TABLET | ORAL | Status: DC

## 2015-06-05 MED ORDER — IOHEXOL 300 MG/ML  SOLN
25.0000 mL | INTRAMUSCULAR | Status: AC
  Administered 2015-06-05 (×2): 25 mL via ORAL

## 2015-06-05 MED ORDER — LORAZEPAM 2 MG/ML IJ SOLN
1.0000 mg | Freq: Once | INTRAMUSCULAR | Status: AC
  Administered 2015-06-05: 1 mg via INTRAVENOUS
  Filled 2015-06-05: qty 1

## 2015-06-05 MED ORDER — IOHEXOL 300 MG/ML  SOLN
100.0000 mL | Freq: Once | INTRAMUSCULAR | Status: AC | PRN
  Administered 2015-06-05: 100 mL via INTRAVENOUS

## 2015-06-05 MED ORDER — OXYCODONE-ACETAMINOPHEN 5-325 MG PO TABS: 2.0000 | ORAL_TABLET | ORAL | Status: DC | PRN

## 2015-06-05 NOTE — ED Notes (Signed)
States took Percocet x 2 tabs PTA to [facility] ED.

## 2015-06-05 NOTE — ED Notes (Signed)
MD at bedside. 

## 2015-06-05 NOTE — ED Notes (Signed)
Pt with back pain that started 3-4 days ago, now has L foot drop. Pt also dx with renal mass yesterday.
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
## 2015-06-05 NOTE — ED Provider Notes (Signed)
MSE was initiated and I personally evaluated the patient and placed orders (if any) at  3:07 PM on June 05, 2015.  Patient sent here transfer from [facility] for MRI of L-spine for new onset left lower extremity numbness and weakness. Known renal carcinoma recently diagnosed. Will get contrasted scan to r/o mets to spine. Patient due to undergo CT imaging tomorrow at [facility] for staging, these studies were ordered for completion here while awaiting MRI results. Care transferred to Dr. Alfonse Spruce pending results of studies with plan to discharge for outpatient follow-up of peripheral foot drop if no acute spinal cord involvement.  Leo Grosser, MD 06/05/15 [phone]

## 2015-06-05 NOTE — Progress Notes (Signed)
BP 196/98 mmHg  Pulse 53  Temp(Src) 97.3 F (36.3 C)  Ht 6\' 5"  (1.956 m)  Wt 368 lb (166.924 kg)  BMI 43.63 kg/m2  SpO2 96%   Subjective:    Patient ID: Anthony Logan, male    DOB: Feb 04, 1961, 55 y.o.   MRN: [id number]  HPI: Anthony Logan is a 55 y.o. male presenting on 06/05/2015 for Hospitalization Follow-up and Back Pain   HPI  Pt was called to come in today after noting ER visit yesterday to make sure pt getting to specialists as needed and discuss financial issues (he has been denied for [facility] discount and for medicaid in the past).  Pt states he just came from urologist Nicolette Bang).  Pt states that urologist ordered CT for tomorrow and told pt to go to ER to get MRI today.  Pt says he was told the urologist would call him after the testing to schedule f/u.    Pt has f/u with nephrologist approximately march 1.  He has routine f/u here on February 14.  Pt states he is having a lot of confusion insofar as not knowing what to do, what is going on, etc.     Relevant past medical, surgical, family and social history reviewed and updated as indicated. Interim medical history since our last visit reviewed. Allergies and medications reviewed and updated.  Current outpatient prescriptions:  .  aspirin 81 MG tablet, Take 81 mg by mouth daily., Disp: , Rfl:  .  Chlorpheniramine-Acetaminophen (CORICIDIN HBP COLD/FLU PO), Take 2 capsules by mouth 2 (two) times daily as needed (Cold)., Disp: , Rfl:  .  Cholecalciferol (VITAMIN D-3 PO), Take 1,000 Units by mouth daily., Disp: , Rfl:  .  fish oil-omega-3 fatty acids 1000 MG capsule, Take 4 g by mouth daily. 4 tabs daily, Disp: , Rfl:  .  furosemide (LASIX) 20 MG tablet, Take 20-40 mg by mouth See admin instructions. Take 40mg  in the morning and 20mg  in the afternoon, Disp: , Rfl:  .  lisinopril (PRINIVIL,ZESTRIL) 40 MG tablet, Take 1 tablet (40 mg total) by mouth daily., Disp: 30 tablet, Rfl: 3 .  metoprolol (LOPRESSOR) 100 MG  tablet, Take 200 mg by mouth 2 (two) times daily. 2 tabs bid, Disp: , Rfl:  .  Multiple Vitamin (MULTI VITAMIN DAILY PO), Take 1 tablet by mouth 2 (two) times a week. , Disp: , Rfl:  .  naproxen sodium (ANAPROX) 220 MG tablet, Take 440 mg by mouth daily as needed. , Disp: , Rfl:  .  oxyCODONE-acetaminophen (PERCOCET/ROXICET) 5-325 MG tablet, Take 2 tablets by mouth every 4 (four) hours as needed for severe pain., Disp: 20 tablet, Rfl: 0 .  potassium gluconate 595 MG TABS tablet, Take 595 mg by mouth daily. 2 tabs daily, Disp: , Rfl:    Review of Systems  Per HPI unless specifically indicated above     Objective:    BP 196/98 mmHg  Pulse 53  Temp(Src) 97.3 F (36.3 C)  Ht 6\' 5"  (1.956 m)  Wt 368 lb (166.924 kg)  BMI 43.63 kg/m2  SpO2 96%  Wt Readings from Last 3 Encounters:  06/05/15 368 lb (166.924 kg)  06/04/15 370 lb (167.831 kg)  03/27/15 367 lb 8 oz (166.697 kg)    Physical Exam  Constitutional: He is oriented to person, place, and time. He appears well-developed and well-nourished.  Pulmonary/Chest: Effort normal.  Neurological: He is alert and oriented to person, place, and time.  Psychiatric: He  has a normal mood and affect. His behavior is normal. Thought content normal.        Assessment & Plan:   Encounter Diagnosis  Name Primary?  . Renal mass Yes     Pt is already in with urologist and is getting workup for his renal mass.  The urologist has also arranged evaluation for his back including MRI today.    Gave pt card for [facility] for him to call to get counseling to help him through this difficult time.  Urged pt to get to [facility] this week to reapply for medicaid   Follow up in February as scheduled.  Told pt to call and RTO sooner if there is anything he needs help with prior to that time

## 2015-06-05 NOTE — ED Notes (Signed)
Transfer instructions given to pt. IV intact. Pt to report to [facility] ED via POV. Dr. Laneta Simmers receiving MD.

## 2015-06-05 NOTE — ED Provider Notes (Signed)
Patient was accepted and signed out pending MRI and CT studies. MRI showed impingement of L5 but no signs of cord compression. Patient was able to ambulate in his examination room. He did have both plantar and dorsal flexion on examination. He complained of continued intermittent numbness in the leg. Also reviewed the CT results with the patient and his wife including the concern for neoplasm in his kidney as well as the lesion found in his thyroid. They expressed understanding of all of these results. Examination and MRI were reviewed with Dr. Cyndy Freeze from neurosurgery. At this time he recommended steroids and outpatient follow-up. Patient was discharged home in stable condition with instruction to follow up with urology, his PCP, neurosurgery.  Anthony Quale, MD 06/05/15 [phone]

## 2015-06-05 NOTE — Discharge Instructions (Signed)
You were seen today and evaluated for your back pain as well as your left foot numbness and difficulty walking intermittently. Take the steroids prescribed. Continue taking pain medicine as needed. I discussed her MRI with the neurosurgeon. You need to follow up with him in his office in the next few weeks. Your CTs today again showed the mass on her kidney concerning for cancer as well as a lesion on your thyroid that needs to be reevaluated. Follow-up with Dr. Noah Delaine from urology regarding your kidney and follow-up with her primary care physician regarding her thyroid.  Lumbosacral Radiculopathy Lumbosacral radiculopathy is a condition that involves the spinal nerves and nerve roots in the low back and bottom of the spine. The condition develops when these nerves and nerve roots move out of place or become inflamed and cause symptoms. CAUSES This condition may be caused by:  Pressure from a disk that bulges out of place (herniated disk). A disk is a plate of cartilage that separates bones in the spine.  Disk degeneration.  A narrowing of the bones of the lower back (spinal stenosis).  A tumor.  An infection.  An injury that places sudden pressure on the disks that cushion the bones of your lower spine. RISK FACTORS This condition is more likely to develop in:  Males aged 30-50 years.  Females aged 66-60 years.  People who lift improperly.  People who are overweight or live a sedentary lifestyle.  People who smoke.  People who perform repetitive activities that strain the spine. SYMPTOMS Symptoms of this condition include:  Pain that goes down from the back into the legs (sciatica). This is the most common symptom. The pain may be worse with sitting, coughing, or sneezing.  Pain and numbness in the arms and legs.  Muscle weakness.  Tingling.  Loss of bladder control or bowel control. DIAGNOSIS This condition is diagnosed with a physical exam and medical history. If  the pain is lasting, you may have tests, such as:  MRI scan.  X-ray.  CT scan.  Myelogram.  Nerve conduction study. TREATMENT This condition is often treated with:  Hot packs and ice applied to affected areas.  Stretches to improve flexibility.  Exercises to strengthen back muscles.  Physical therapy.  Pain medicine.  A steroid injection in the spine. In some cases, no treatment is needed. If the condition is long-lasting (chronic), or if symptoms are severe, treatment may involve surgery or lifestyle changes, such as following a weight loss plan. HOME CARE INSTRUCTIONS Medicines  Take medicines only as directed by your health care provider.  Do not drive or operate heavy machinery while taking pain medicine. Injury Care  Apply a heat pack to the injured area as directed by your health care provider.  Apply ice to the affected area:  Put ice in a plastic bag.  Place a towel between your skin and the bag.  Leave the ice on for 20-30 minutes, every 2 hours while you are awake or as needed. Or, leave the ice on for as long as directed by your health care provider. Other Instructions  If you were shown how to do any exercises or stretches, do them as directed by your health care provider.  If your health care provider prescribed a diet or exercise program, follow it as directed.  Keep all follow-up visits as directed by your health care provider. This is important. SEEK MEDICAL CARE IF:  Your pain does not improve over time even when taking pain  medicines. SEEK IMMEDIATE MEDICAL CARE IF:  Your develop severe pain.  Your pain suddenly gets worse.  You develop increasing weakness in your legs.  You lose the ability to control your bladder or bowel.  You have difficulty walking or balancing.  You have a fever.   This information is not intended to replace advice given to you by your health care provider. Make sure you discuss any questions you have with  your health care provider.   Document Released: 05/05/2005 Document Revised: 09/19/2014 Document Reviewed: 05/01/2014 Elsevier Interactive Patient Education [employer].

## 2015-06-05 NOTE — ED Notes (Signed)
Pt in MRI.

## 2015-06-05 NOTE — Patient Instructions (Signed)
Call for counseling if needed Reapply for Medicaid

## 2015-06-05 NOTE — ED Notes (Signed)
Pt arrived POV from [facility] for MRI. C/o lower left back pain that radiates down leg and numbness to leg and foot. Denies injury. Denies weakness or numbness to left arm/face.

## 2015-06-06 ENCOUNTER — Other Ambulatory Visit: Payer: Self-pay | Admitting: Physician Assistant

## 2015-06-06 ENCOUNTER — Ambulatory Visit (HOSPITAL_COMMUNITY): Admission: RE | Admit: 2015-06-06 | Payer: Self-pay | Source: Ambulatory Visit

## 2015-06-06 DIAGNOSIS — E079 Disorder of thyroid, unspecified: Secondary | ICD-10-CM

## 2015-06-06 LAB — CREATININE, SERUM: CREATININE: 0.7 mg/dL (ref 0.70–1.33)

## 2015-06-06 LAB — BUN: BUN: 17 mg/dL (ref 7–25)

## 2015-06-06 MED ORDER — GADOBENATE DIMEGLUMINE 529 MG/ML IV SOLN
20.0000 mL | Freq: Once | INTRAVENOUS | Status: AC | PRN
  Administered 2015-06-05: 20 mL via INTRAVENOUS

## 2015-06-08 ENCOUNTER — Other Ambulatory Visit: Payer: Self-pay | Admitting: Physician Assistant

## 2015-06-11 ENCOUNTER — Ambulatory Visit (HOSPITAL_COMMUNITY)
Admission: RE | Admit: 2015-06-11 | Discharge: 2015-06-11 | Disposition: A | Discharge status: Home / Self Care | Payer: Medicaid Other | Source: Ambulatory Visit | Attending: Physician Assistant | Admitting: Physician Assistant

## 2015-06-11 DIAGNOSIS — E079 Disorder of thyroid, unspecified: Secondary | ICD-10-CM | POA: Diagnosis not present

## 2015-06-11 DIAGNOSIS — E042 Nontoxic multinodular goiter: Secondary | ICD-10-CM | POA: Diagnosis not present

## 2015-06-13 ENCOUNTER — Ambulatory Visit: Payer: Self-pay | Admitting: Physician Assistant

## 2015-06-13 VITALS — BP 182/98 | HR 53 | Temp 97.7°F | Ht 77.0 in | Wt 356.0 lb

## 2015-06-13 DIAGNOSIS — I10 Essential (primary) hypertension: Secondary | ICD-10-CM

## 2015-06-13 DIAGNOSIS — M5416 Radiculopathy, lumbar region: Secondary | ICD-10-CM

## 2015-06-13 DIAGNOSIS — E049 Nontoxic goiter, unspecified: Secondary | ICD-10-CM | POA: Insufficient documentation

## 2015-06-13 DIAGNOSIS — N2889 Other specified disorders of kidney and ureter: Secondary | ICD-10-CM | POA: Insufficient documentation

## 2015-06-13 DIAGNOSIS — R937 Abnormal findings on diagnostic imaging of other parts of musculoskeletal system: Secondary | ICD-10-CM

## 2015-06-13 NOTE — Patient Instructions (Addendum)
Call urology office on Friday- find out when you will follow up with urologist.   Please let our office know what you find out.  Fill out and turn in [employer] application.  We will refer you for aspiration of thyroid.   We will refer you to oncologist.  Call for counseling referral.  We will work on referring  you to neurosurgeon

## 2015-06-13 NOTE — Progress Notes (Signed)
BP 182/98 mmHg  Pulse 53  Temp(Src) 97.7 F (36.5 C)  Ht 6\' 5"  (1.956 m)  Wt 356 lb (161.481 kg)  BMI 42.21 kg/m2  SpO2 98%   Subjective:    Patient ID: Anthony Logan, male    DOB: [birth year], 55 y.o.   MRN: [id number]  HPI: Anthony Logan is a 55 y.o. male presenting on 06/13/2015 for Follow-up   HPI   Pt recently found to have mass thought to be a renal cell carcinoma.  He has had one appointment with the urologist after this finding.  Pt had [place] of thyroid mass on 06/11/15 and found to have goiter with recommendations for biopsy.  Pt has sent in release forms to try to reapply for medicaid.  Pt did not yet resubmit a cone discount application.  He did not call for counseling.  He says he lost the card we gave him.  Pt says the urology office called him yesterday about follow-up but he doesn't know if that was for appt to f/u with urology or if it had to do with referral to neurosurgery.      Our office called the urologist office and was told that he needs to see [name] who is their surgeon and they have sent a message to him b/c the pt needs to be seen before feb 22, which is his first open appt.  They said they have no orders to make neurosurgery referral.  Urology office said to call back on Friday to check on f/u appointment with urologist.  Pt's next appt with dr Hinda Lenis (nephrologist) is March 1.  No pain in foot.  It is still weak and sometimes numb. He is still having problems walikng   Relevant past medical, surgical, family and social history reviewed and updated as indicated. Interim medical history since our last visit reviewed. Allergies and medications reviewed and updated.   Current outpatient prescriptions:  .  aspirin 81 MG tablet, Take 81 mg by mouth at bedtime. , Disp: , Rfl:  .  Cholecalciferol (VITAMIN D-3 PO), Take 1,000 Units by mouth daily., Disp: , Rfl:  .  fish oil-omega-3 fatty acids 1000 MG capsule, Take 2 g by mouth 2 (two) times daily.  , Disp: , Rfl:  .  furosemide (LASIX) 20 MG tablet, Take 20-40 mg by mouth See admin instructions. Take 40mg  in the morning and 20mg  in the afternoon, Disp: , Rfl:  .  lisinopril (PRINIVIL,ZESTRIL) 40 MG tablet, TAKE 1 Tablet BY MOUTH DAILY, Disp: 90 tablet, Rfl: 0 .  metoprolol (LOPRESSOR) 100 MG tablet, TAKE 2 Tablets BY MOUTH TWICE DAILY, Disp: 360 tablet, Rfl: 0 .  Multiple Vitamin (MULTI VITAMIN DAILY PO), Take 1 tablet by mouth 2 (two) times a week. , Disp: , Rfl:  .  naproxen sodium (ANAPROX) 220 MG tablet, Take 440 mg by mouth daily as needed (pain, headache). , Disp: , Rfl:  .  oxyCODONE-acetaminophen (PERCOCET/ROXICET) 5-325 MG tablet, Take 2 tablets by mouth every 4 (four) hours as needed for severe pain., Disp: 20 tablet, Rfl: 0 .  potassium gluconate 595 MG TABS tablet, Take 1,190 mg by mouth daily. , Disp: , Rfl:    Review of Systems  Constitutional: Negative for fever, chills, diaphoresis, appetite change, fatigue and unexpected weight change.  HENT: Negative for congestion, dental problem, drooling, ear pain, facial swelling, hearing loss, mouth sores, sneezing, sore throat, trouble swallowing and voice change.   Eyes: Negative for pain, discharge, redness, itching  and visual disturbance.  Respiratory: Negative for cough, choking, shortness of breath and wheezing.   Cardiovascular: Negative for chest pain, palpitations and leg swelling.  Gastrointestinal: Negative for vomiting, abdominal pain, diarrhea, constipation and blood in stool.  Endocrine: Negative for cold intolerance, heat intolerance and polydipsia.  Genitourinary: Negative for dysuria, hematuria and decreased urine volume.  Musculoskeletal: Positive for back pain, arthralgias and gait problem.  Skin: Negative for rash.  Allergic/Immunologic: Negative for environmental allergies.  Neurological: Negative for seizures, syncope, light-headedness and headaches.  Hematological: Negative for adenopathy.   Psychiatric/Behavioral: Positive for dysphoric mood. Negative for suicidal ideas and agitation. The patient is nervous/anxious.     Per HPI unless specifically indicated above     Objective:    BP 182/98 mmHg  Pulse 53  Temp(Src) 97.7 F (36.5 C)  Ht 6\' 5"  (1.956 m)  Wt 356 lb (161.481 kg)  BMI 42.21 kg/m2  SpO2 98%  Wt Readings from Last 3 Encounters:  06/13/15 356 lb (161.481 kg)  06/05/15 368 lb (166.924 kg)  06/05/15 368 lb (166.924 kg)    Physical Exam  Constitutional: He is oriented to person, place, and time. He appears well-developed and well-nourished.  HENT:  Head: Normocephalic and atraumatic.  Neck: Neck supple.  Cardiovascular: Normal rate and regular rhythm.   Pulmonary/Chest: Effort normal and breath sounds normal. He has no wheezes.  Abdominal: Soft. Bowel sounds are normal. There is no tenderness.  Musculoskeletal: He exhibits no edema.  Lymphadenopathy:    He has no cervical adenopathy.  Neurological: He is alert and oriented to person, place, and time.  Skin: Skin is warm and dry.  Psychiatric: He has a normal mood and affect. His behavior is normal.  Vitals reviewed.       Assessment & Plan:   Encounter Diagnoses  Name Primary?  . Mass of right kidney Yes  . Goiter   . Essential hypertension, benign   . Radiculopathy of lumbar region   . Abnormal MRI, lumbar spine     -Pt counseled to Resubmit cone discount application -Refer to interventional radiology for FNA of thyroid -discussed possible need for referral to oncology.  Spoke with oncologist PA who stated that pt does not need referral at this time due to no indication of metastatic disease -Gave another card for counseling. Recommend he call for counseling -Refer to neurosurgery -f/u 07/03/15 as scheduled.  RTO sooner prn

## 2015-06-14 DIAGNOSIS — M5416 Radiculopathy, lumbar region: Secondary | ICD-10-CM | POA: Insufficient documentation

## 2015-06-16 DIAGNOSIS — R937 Abnormal findings on diagnostic imaging of other parts of musculoskeletal system: Secondary | ICD-10-CM | POA: Insufficient documentation

## 2015-07-03 ENCOUNTER — Ambulatory Visit: Payer: Self-pay | Admitting: Physician Assistant

## 2015-07-03 ENCOUNTER — Encounter: Payer: Self-pay | Admitting: Physician Assistant

## 2015-07-03 VITALS — BP 166/110 | HR 61 | Temp 97.5°F | Ht 77.0 in | Wt 358.0 lb

## 2015-07-03 DIAGNOSIS — I10 Essential (primary) hypertension: Secondary | ICD-10-CM

## 2015-07-03 DIAGNOSIS — M5416 Radiculopathy, lumbar region: Secondary | ICD-10-CM

## 2015-07-03 DIAGNOSIS — N2889 Other specified disorders of kidney and ureter: Secondary | ICD-10-CM

## 2015-07-03 DIAGNOSIS — E785 Hyperlipidemia, unspecified: Secondary | ICD-10-CM

## 2015-07-03 DIAGNOSIS — R609 Edema, unspecified: Secondary | ICD-10-CM | POA: Insufficient documentation

## 2015-07-03 DIAGNOSIS — E049 Nontoxic goiter, unspecified: Secondary | ICD-10-CM

## 2015-07-03 NOTE — Progress Notes (Signed)
BP 166/110 mmHg  Pulse 61  Temp(Src) 97.5 F (36.4 C)  Ht 6\' 5"  (1.956 m)  Wt 358 lb (162.388 kg)  BMI 42.44 kg/m2  SpO2 95%   Subjective:    Patient ID: Anthony Logan, male    DOB: Aug 29, 1960, 55 y.o.   MRN: [id number]  HPI: Anthony Logan is a 55 y.o. male presenting on 07/03/2015 for Hypertension and Hyperlipidemia   HPI  Pt's first ex-wife is with him today.  Pt has appt with urologist this week.  FNA thyroid appointment still pending.  Pt on list for neurosurgery appt at [facility].  Pt says he called for counseling and was given another number. He says he was given a different number for [employer] and he called but couldn't get through.    Pt says he spoke with medicaid people in [place] and he says that is still pending.  He says he isn't sure about cone discount but says he got something that says his echo done in decmeber was all paid for.   Pt states appt with nephrologist march 1  Pt states not much pain.  Mostly just numbness and weakenss in the foot and leg.  Relevant past medical, surgical, family and social history reviewed and updated as indicated. Interim medical history since our last visit reviewed. Allergies and medications reviewed and updated.   Current outpatient prescriptions:  .  aspirin 81 MG tablet, Take 81 mg by mouth at bedtime. , Disp: , Rfl:  .  Cholecalciferol (VITAMIN D-3 PO), Take 1,000 Units by mouth daily., Disp: , Rfl:  .  fish oil-omega-3 fatty acids 1000 MG capsule, Take 2 g by mouth 2 (two) times daily. , Disp: , Rfl:  .  furosemide (LASIX) 20 MG tablet, Take 20-40 mg by mouth See admin instructions. Take 40mg  in the morning and 20mg  in the afternoon, Disp: , Rfl:  .  lisinopril (PRINIVIL,ZESTRIL) 40 MG tablet, TAKE 1 Tablet BY MOUTH DAILY, Disp: 90 tablet, Rfl: 0 .  metoprolol (LOPRESSOR) 100 MG tablet, TAKE 2 Tablets BY MOUTH TWICE DAILY, Disp: 360 tablet, Rfl: 0 .  Multiple Vitamin (MULTI VITAMIN DAILY PO), Take 1 tablet by mouth 2 (two)  times a week. , Disp: , Rfl:  .  naproxen sodium (ANAPROX) 220 MG tablet, Take 440 mg by mouth daily as needed (pain, headache). , Disp: , Rfl:  .  oxyCODONE-acetaminophen (PERCOCET/ROXICET) 5-325 MG tablet, Take 2 tablets by mouth every 4 (four) hours as needed for severe pain., Disp: 20 tablet, Rfl: 0 .  potassium gluconate 595 MG TABS tablet, Take 1,190 mg by mouth daily. Reported on 07/03/2015, Disp: , Rfl:    Review of Systems  Constitutional: Negative for fever, chills, diaphoresis, appetite change, fatigue and unexpected weight change.  HENT: Negative for congestion, dental problem, drooling, ear pain, facial swelling, hearing loss, mouth sores, sneezing, sore throat, trouble swallowing and voice change.   Eyes: Negative for pain, discharge, redness, itching and visual disturbance.  Respiratory: Negative for cough, choking, shortness of breath and wheezing.   Cardiovascular: Negative for chest pain, palpitations and leg swelling.  Gastrointestinal: Negative for vomiting, abdominal pain, diarrhea, constipation and blood in stool.  Endocrine: Negative for cold intolerance, heat intolerance and polydipsia.  Genitourinary: Negative for dysuria, hematuria and decreased urine volume.  Musculoskeletal: Positive for back pain and gait problem. Negative for arthralgias.  Skin: Negative for rash.  Allergic/Immunologic: Negative for environmental allergies.  Neurological: Negative for seizures, syncope, light-headedness and headaches.  Hematological:  Negative for adenopathy.  Psychiatric/Behavioral: Negative for suicidal ideas, dysphoric mood and agitation. The patient is not nervous/anxious.     Per HPI unless specifically indicated above     Objective:    BP 166/110 mmHg  Pulse 61  Temp(Src) 97.5 F (36.4 C)  Ht 6\' 5"  (1.956 m)  Wt 358 lb (162.388 kg)  BMI 42.44 kg/m2  SpO2 95%  Wt Readings from Last 3 Encounters:  07/03/15 358 lb (162.388 kg)  06/13/15 356 lb (161.481 kg)   06/05/15 368 lb (166.924 kg)    Physical Exam  Constitutional: He is oriented to person, place, and time. He appears well-developed and well-nourished.  HENT:  Head: Normocephalic and atraumatic.  Neck: Neck supple.  Cardiovascular: Normal rate and regular rhythm.   Pulmonary/Chest: Effort normal and breath sounds normal. He has no wheezes.  Abdominal: Soft. Bowel sounds are normal. There is no hepatosplenomegaly. There is no tenderness.  obese  Musculoskeletal: He exhibits edema (trace - 1+ bilaterally).  Lymphadenopathy:    He has no cervical adenopathy.  Neurological: He is alert and oriented to person, place, and time.  Skin: Skin is warm and dry.  Psychiatric: He has a normal mood and affect. His behavior is normal.  Vitals reviewed.       Assessment & Plan:   Encounter Diagnoses  Name Primary?  . Essential hypertension, benign Yes  . Goiter   . Radiculopathy of lumbar region   . Mass of right kidney   . Hyperlipidemia   . Edema, unspecified type     -no changes today -we will check on pt's cone discount.  -Pt has: Urology appointment tomorrow Nephrology appt march 1 Interventional radiology referral (for FNA thyroid) appt pending Neurosurgery referral- appt pending - will f/u here in 2 months.  Pt to RTO sooner prn

## 2015-07-04 ENCOUNTER — Ambulatory Visit (INDEPENDENT_AMBULATORY_CARE_PROVIDER_SITE_OTHER): Payer: Medicaid Other | Admitting: Urology

## 2015-07-04 DIAGNOSIS — N289 Disorder of kidney and ureter, unspecified: Secondary | ICD-10-CM | POA: Diagnosis not present

## 2015-07-05 ENCOUNTER — Other Ambulatory Visit: Payer: Self-pay | Admitting: Urology

## 2015-07-05 DIAGNOSIS — N2889 Other specified disorders of kidney and ureter: Secondary | ICD-10-CM

## 2015-07-09 ENCOUNTER — Other Ambulatory Visit: Payer: Self-pay | Admitting: Physician Assistant

## 2015-07-09 ENCOUNTER — Other Ambulatory Visit: Payer: Self-pay | Admitting: Urology

## 2015-07-09 DIAGNOSIS — N183 Chronic kidney disease, stage 3 unspecified: Secondary | ICD-10-CM

## 2015-07-09 DIAGNOSIS — R809 Proteinuria, unspecified: Secondary | ICD-10-CM

## 2015-07-09 DIAGNOSIS — E559 Vitamin D deficiency, unspecified: Secondary | ICD-10-CM

## 2015-07-09 DIAGNOSIS — I10 Essential (primary) hypertension: Secondary | ICD-10-CM

## 2015-07-09 DIAGNOSIS — Z79899 Other long term (current) drug therapy: Secondary | ICD-10-CM

## 2015-07-09 DIAGNOSIS — N2889 Other specified disorders of kidney and ureter: Secondary | ICD-10-CM

## 2015-07-09 DIAGNOSIS — D649 Anemia, unspecified: Secondary | ICD-10-CM

## 2015-07-10 LAB — RENAL FUNCTION PANEL
ALBUMIN: 4.2 g/dL (ref 3.6–5.1)
BUN: 12 mg/dL (ref 7–25)
CALCIUM: 9.2 mg/dL (ref 8.6–10.3)
CO2: 33 mmol/L — AB (ref 20–31)
CREATININE: 0.73 mg/dL (ref 0.70–1.33)
Chloride: 99 mmol/L (ref 98–110)
GLUCOSE: 134 mg/dL — AB (ref 65–99)
PHOSPHORUS: 3.1 mg/dL (ref 2.5–4.5)
Potassium: 4 mmol/L (ref 3.5–5.3)
Sodium: 139 mmol/L (ref 135–146)

## 2015-07-10 LAB — HEMOGLOBIN: Hemoglobin: 16 g/dL (ref 13.0–17.0)

## 2015-07-10 LAB — HEMATOCRIT: HEMATOCRIT: 47.8 % (ref 39.0–52.0)

## 2015-07-11 ENCOUNTER — Other Ambulatory Visit: Payer: Self-pay | Admitting: General Surgery

## 2015-07-11 LAB — PTH, INTACT AND CALCIUM
CALCIUM: 9 mg/dL (ref 8.4–10.5)
PTH: 36 pg/mL (ref 14–64)

## 2015-07-11 LAB — VITAMIN D 25 HYDROXY (VIT D DEFICIENCY, FRACTURES): VIT D 25 HYDROXY: 32 ng/mL (ref 30–100)

## 2015-07-11 LAB — PROTEIN / CREATININE RATIO, URINE
CREATININE, URINE: 210 mg/dL (ref 20–370)
PROTEIN CREATININE RATIO: 571 mg/g{creat} — AB (ref 22–128)
TOTAL PROTEIN, URINE: 120 mg/dL — AB (ref 5–25)

## 2015-07-12 ENCOUNTER — Ambulatory Visit (HOSPITAL_COMMUNITY)
Admission: RE | Admit: 2015-07-12 | Discharge: 2015-07-12 | Disposition: A | Discharge status: Home / Self Care | Payer: Medicaid Other | Source: Ambulatory Visit | Attending: Urology | Admitting: Urology

## 2015-07-12 ENCOUNTER — Encounter (HOSPITAL_COMMUNITY): Payer: Self-pay

## 2015-07-12 ENCOUNTER — Other Ambulatory Visit: Payer: Self-pay | Admitting: Urology

## 2015-07-12 DIAGNOSIS — N2889 Other specified disorders of kidney and ureter: Secondary | ICD-10-CM | POA: Diagnosis present

## 2015-07-12 LAB — CBC
HCT: 48.4 % (ref 39.0–52.0)
Hemoglobin: 16.5 g/dL (ref 13.0–17.0)
MCH: 31.7 pg (ref 26.0–34.0)
MCHC: 34.1 g/dL (ref 30.0–36.0)
MCV: 92.9 fL (ref 78.0–100.0)
PLATELETS: 297 10*3/uL (ref 150–400)
RBC: 5.21 MIL/uL (ref 4.22–5.81)
RDW: 12.9 % (ref 11.5–15.5)
WBC: 9.7 10*3/uL (ref 4.0–10.5)

## 2015-07-12 LAB — PROTIME-INR
INR: 1.09 (ref 0.00–1.49)
PROTHROMBIN TIME: 13.9 s (ref 11.6–15.2)

## 2015-07-12 LAB — APTT: aPTT: 31 seconds (ref 24–37)

## 2015-07-12 MED ORDER — FENTANYL CITRATE (PF) 100 MCG/2ML IJ SOLN
INTRAMUSCULAR | Status: AC | PRN
  Administered 2015-07-12: 50 ug via INTRAVENOUS

## 2015-07-12 MED ORDER — MIDAZOLAM HCL 2 MG/2ML IJ SOLN
INTRAMUSCULAR | Status: AC
  Filled 2015-07-12: qty 6

## 2015-07-12 MED ORDER — MIDAZOLAM HCL 2 MG/2ML IJ SOLN
INTRAMUSCULAR | Status: AC | PRN
  Administered 2015-07-12 (×2): 1 mg via INTRAVENOUS

## 2015-07-12 MED ORDER — SODIUM CHLORIDE 0.9 % IV SOLN
INTRAVENOUS | Status: DC
  Administered 2015-07-12: 11:00:00 via INTRAVENOUS

## 2015-07-12 MED ORDER — FENTANYL CITRATE (PF) 100 MCG/2ML IJ SOLN
INTRAMUSCULAR | Status: AC
  Filled 2015-07-12: qty 4

## 2015-07-12 NOTE — Sedation Documentation (Signed)
Patient denies pain and is resting comfortably.  
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
## 2015-07-12 NOTE — Discharge Instructions (Signed)
Kidney Biopsy A biopsy is a test that involves collecting small pieces of tissue, usually with a needle. The tissue is then examined under a microscope. A kidney biopsy can help a health care provider make a diagnosis and determine the best course of treatment. Your health care provider may recommend a kidney biopsy if you have any of the following conditions:  Blood in your urine (hematuria).  Excessive protein in your urine (proteinuria).  Impaired kidney function that causes excessive waste products in your blood. A specialist will look at the kidney tissue samples to check for unusual deposits, scarring, or infecting organisms that would explain your condition. If you have a kidney transplant, a biopsy can also help explain why a transplanted kidney is not working properly. Talk with your health care provider about what information might be learned from the biopsy and the risks involved. This can help you make a decision about whether a biopsy is worthwhile in your case. LET [facility] CARE PROVIDER KNOW ABOUT:  Any allergies you have.  All medicines you are taking, including vitamins, herbs, eye drops, creams, and over-the-counter medicines.  Previous problems you or members of your family have had with the use of anesthetics.  Any blood disorders you have.  Previous surgeries you have had.  Medical conditions you have. RISKS AND COMPLICATIONS Generally, a kidney biopsy is a safe procedure. However, as with any procedure, complications can occur. Possible complications include:  Infection.  Bleeding. BEFORE THE PROCEDURE  Make sure you understand the need for a biopsy.  Do not eat or drink for 8 hours before the test or as directed by your health care provider.  You will need to give blood and urine samples before the biopsy. This is to make sure you do not have a condition where you should not have a biopsy. PROCEDURE Kidney biopsies are usually done in a hospital. During  the procedure, you may be fully awake with light sedation, or you may be asleep under general anesthesia. The entire procedure usually takes an hour.  You will lie on your stomach to position the kidneys near the surface of your back. If you have a transplanted kidney, you will lie on your back.  The health care provider will inject a local painkiller. For a through-the-skin (percutaneous) biopsy, the health care provider will use a locating needle and X-ray or ultrasound equipment to find the right spot.  A collecting needle will be used to gather the tissue. If you are awake, you will be asked to hold your breath as the needle is inserted and collects the tissue. Each insertion and collection lasts about 30 seconds or a little longer. You will be told when to exhale. AFTER THE PROCEDURE  You will lie on your back for 12 to 24 hours. If you have a transplanted kidney, you may not have to lie on your back. During this time, your back will probably feel sore. You may stay in the hospital overnight after the procedure so that staff can check your condition.  You may notice some blood in your urine for 24 hours after the test. To detect any problems, your health care providers will:  Monitor your blood pressure and pulse.  Take blood samples to measure the amount of red blood cells.  Examine the urine that you pass.  On rare occasions when bleeding is excessive, it may be necessary to replace lost blood with a transfusion.  It is your responsibility to obtain your test results.  Ask the lab or department performing the test when and how you will get your results. FOR MORE INFORMATION  [employer]: https://mathis.com/  National Kidney Foundation: www.kidney.org  National Kidney and Urologic Diseases Information Clearinghouse: http://kidney.AmenCredit.is   This information is not intended to replace advice given to you by your health care provider. Make sure you discuss any questions you  have with your health care provider.   Document Released: 03/15/2004 Document Revised: 02/23/2013 Document Reviewed: 11/08/2012 Elsevier Interactive Patient Education 2016 Elsevier Inc. Kidney Biopsy, Care After Refer to this sheet in the next few weeks. These instructions provide you with information on caring for yourself after your procedure. Your health care provider may also give you more specific instructions. Your treatment has been planned according to current medical practices, but problems sometimes occur. Call your health care provider if you have any problems or questions after your procedure.  WHAT TO EXPECT AFTER THE PROCEDURE   You may notice blood in the urine for the first 24 hours after the biopsy.  You may feel some pain at the biopsy site for 1-2 weeks after the biopsy. HOME CARE INSTRUCTIONS  Do not lift anything heavier than 10 lb (4.5 kg) for 2 weeks.  Do not take any non-steroidal anti-inflammatory drugs (NSAIDs) or any blood thinners for a week after the biopsy unless instructed to do so by your health care provider.  Only take medicines for pain, fever, or discomfort as directed by your health care provider. SEEK MEDICAL CARE IF:  You have bloody urine more than 24 hours after the biopsy.   You develop a fever.   You cannot urinate.   You have increasing pain at the biopsy site.  SEEK IMMEDIATE MEDICAL CARE IF: You feel faint or dizzy.    This information is not intended to replace advice given to you by your health care provider. Make sure you discuss any questions you have with your health care provider.   Document Released: 01/05/2013 Document Reviewed: 01/05/2013 Elsevier Interactive Patient Education 2016 Elsevier Inc. Moderate Conscious Sedation, Adult Sedation is the use of medicines to promote relaxation and relieve discomfort and anxiety. Moderate conscious sedation is a type of sedation. Under moderate conscious sedation you are less alert  than normal but are still able to respond to instructions or stimulation. Moderate conscious sedation is used during short medical and dental procedures. It is milder than deep sedation or general anesthesia and allows you to return to your regular activities sooner. LET [facility] CARE PROVIDER KNOW ABOUT:   Any allergies you have.  All medicines you are taking, including vitamins, herbs, eye drops, creams, and over-the-counter medicines.  Use of steroids (by mouth or creams).  Previous problems you or members of your family have had with the use of anesthetics.  Any blood disorders you have.  Previous surgeries you have had.  Medical conditions you have.  Possibility of pregnancy, if this applies.  Use of cigarettes, alcohol, or illegal drugs. RISKS AND COMPLICATIONS Generally, this is a safe procedure. However, as with any procedure, problems can occur. Possible problems include:  Oversedation.  Trouble breathing on your own. You may need to have a breathing tube until you are awake and breathing on your own.  Allergic reaction to any of the medicines used for the procedure. BEFORE THE PROCEDURE  You may have blood tests done. These tests can help show how well your kidneys and liver are working. They can also show how well your blood  are working. They can also show how well your blood clots. °· A physical exam will be done.   °· Only take medicines as directed by your health care provider. You may need to stop taking medicines (such as blood thinners, aspirin, or nonsteroidal anti-inflammatory drugs) before the procedure.   °· Do not eat or drink at least 6 hours before the procedure or as directed by your health care provider. °· Arrange for a responsible adult, family member, or friend to take you home after the procedure. He or she should stay with you for at least 24 hours after the procedure, until the medicine has worn off. °PROCEDURE  °· An intravenous (IV) catheter will be  inserted into one of your veins. Medicine will be able to flow directly into your body through this catheter. You may be given medicine through this tube to help prevent pain and help you relax. °· The medical or dental procedure will be done. °AFTER THE PROCEDURE °· You will stay in a recovery area until the medicine has worn off. Your blood pressure and pulse will be checked.   °·  Depending on the procedure you had, you may be allowed to go home when you can tolerate liquids and your pain is under control. °  °This information is not intended to replace advice given to you by your health care provider. Make sure you discuss any questions you have with your health care provider. °  °Document Released: 01/28/2001 Document Revised: 05/26/2014 Document Reviewed: 01/10/2013 °Elsevier Interactive Patient Education ©2016 Elsevier Inc. ° °

## 2015-07-12 NOTE — Discharge Instructions (Signed)
Needle Biopsy, Care After °These instructions give you information about caring for yourself after your procedure. Your doctor may also give you more specific instructions. Call your doctor if you have any problems or questions after your procedure. °HOME CARE °· Rest as told by your doctor. °· Take medicines only as told by your doctor. °· There are many different ways to close and cover the biopsy site, including stitches (sutures), skin glue, and adhesive strips. Follow instructions from your doctor about: °¨ How to take care of your biopsy site. °¨ When and how you should change your bandage (dressing). °¨ When you should remove your dressing. °¨ Removing whatever was used to close your biopsy site. °· Check your biopsy site every day for signs of infection. Watch for: °¨ Redness, swelling, or pain. °¨ Fluid, blood, or pus. °GET HELP IF: °· You have a fever. °· You have redness, swelling, or pain at the biopsy site, and it lasts longer than a few days. °· You have fluid, blood, or pus coming from the biopsy site. °· You feel sick to your stomach (nauseous). °· You throw up (vomit). °GET HELP RIGHT AWAY IF: °· You are short of breath. °· You have trouble breathing. °· Your chest hurts. °· You feel dizzy or you pass out (faint). °· You have bleeding that does not stop with pressure or a bandage. °· You cough up blood. °· Your belly (abdomen) hurts. °  °This information is not intended to replace advice given to you by your health care provider. Make sure you discuss any questions you have with your health care provider. °  °Document Released: 04/17/2008 Document Revised: 09/19/2014 Document Reviewed: 05/01/2014 °Elsevier Interactive Patient Education ©2016 Elsevier Inc. °Moderate Conscious Sedation, Adult, Care After °Refer to this sheet in the next few weeks. These instructions provide you with information on caring for yourself after your procedure. Your health care provider may also give you more specific  instructions. Your treatment has been planned according to current medical practices, but problems sometimes occur. Call your health care provider if you have any problems or questions after your procedure. °WHAT TO EXPECT AFTER THE PROCEDURE  °After your procedure: °· You may feel sleepy, clumsy, and have poor balance for several hours. °· Vomiting may occur if you eat too soon after the procedure. °HOME CARE INSTRUCTIONS °· Do not participate in any activities where you could become injured for at least 24 hours. Do not: °· Drive. °· Swim. °· Ride a bicycle. °· Operate heavy machinery. °· Cook. °· Use power tools. °· Climb ladders. °· Work from a high place. °· Do not make important decisions or sign legal documents until you are improved. °· If you vomit, drink water, juice, or soup when you can drink without vomiting. Make sure you have little or no nausea before eating solid foods. °· Only take over-the-counter or prescription medicines for pain, discomfort, or fever as directed by your health care provider. °· Make sure you and your family fully understand everything about the medicines given to you, including what side effects may occur. °· You should not drink alcohol, take sleeping pills, or take medicines that cause drowsiness for at least 24 hours. °· If you smoke, do not smoke without supervision. °· If you are feeling better, you may resume normal activities 24 hours after you were sedated. °· Keep all appointments with your health care provider. °SEEK MEDICAL CARE IF: °· Your skin is pale or bluish in color. °· You   continue to feel nauseous or vomit. °· Your pain is getting worse and is not helped by medicine. °· You have bleeding or swelling. °· You are still sleepy or feeling clumsy after 24 hours. °SEEK IMMEDIATE MEDICAL CARE IF: °· You develop a rash. °· You have difficulty breathing. °· You develop any type of allergic problem. °· You have a fever. °MAKE SURE YOU: °· Understand these  instructions. °· Will watch your condition. °· Will get help right away if you are not doing well or get worse. °  °This information is not intended to replace advice given to you by your health care provider. Make sure you discuss any questions you have with your health care provider. °  °Document Released: 02/23/2013 Document Revised: 05/26/2014 Document Reviewed: 02/23/2013 °Elsevier Interactive Patient Education ©2016 Elsevier Inc. ° °

## 2015-07-12 NOTE — Procedures (Signed)
Interventional Radiology Procedure Note  Procedure: CT guided biopsy RIGHT renal mass.   Cores very poor, in tiny fragments.  A second biopsy device was used to exclude device malfunction.  Needles also repositioned to target different regions of the mass.   Complications: None  Estimated Blood Loss: 0   Recommendations: - Bedrest x 2 hrs   Signed,  Criselda Peaches, MD

## 2015-07-12 NOTE — Progress Notes (Signed)
Pt c/o feeling dizzy.  Vss, afebrile. Dressing to rt flank area is dry and intact.  BP is still elevated but it is within the limits that pt was pre-procedure.  Had pt sit on the the side of the bed for about 5 min.  Pt states it is getting better.  Had pt move around in the room and go to the restroom to see how he felt.  Pt voided and exited the restroom and states I feel fine now.  Iv site d/c and pressure dressing applied.  Will d/c pt to lobby via wheelchair. Son is driving.

## 2015-07-18 ENCOUNTER — Ambulatory Visit (INDEPENDENT_AMBULATORY_CARE_PROVIDER_SITE_OTHER): Payer: Medicaid Other | Admitting: Urology

## 2015-07-18 ENCOUNTER — Other Ambulatory Visit: Payer: Self-pay | Admitting: Urology

## 2015-07-18 DIAGNOSIS — C641 Malignant neoplasm of right kidney, except renal pelvis: Secondary | ICD-10-CM | POA: Diagnosis not present

## 2015-07-18 DIAGNOSIS — N289 Disorder of kidney and ureter, unspecified: Secondary | ICD-10-CM | POA: Diagnosis not present

## 2015-07-18 HISTORY — PX: RENAL BIOPSY: SHX156

## 2015-07-19 ENCOUNTER — Other Ambulatory Visit: Payer: Self-pay | Admitting: Urology

## 2015-07-30 ENCOUNTER — Encounter: Payer: Self-pay | Admitting: Cardiology

## 2015-08-08 ENCOUNTER — Other Ambulatory Visit (HOSPITAL_COMMUNITY): Payer: Self-pay

## 2015-08-09 ENCOUNTER — Encounter: Payer: Self-pay | Admitting: Pediatrics

## 2015-08-09 ENCOUNTER — Ambulatory Visit (INDEPENDENT_AMBULATORY_CARE_PROVIDER_SITE_OTHER): Payer: Medicaid Other | Admitting: Pediatrics

## 2015-08-09 VITALS — BP 158/90 | HR 50 | Temp 96.9°F | Ht 77.0 in | Wt 359.2 lb

## 2015-08-09 DIAGNOSIS — I10 Essential (primary) hypertension: Secondary | ICD-10-CM

## 2015-08-09 DIAGNOSIS — M5416 Radiculopathy, lumbar region: Secondary | ICD-10-CM | POA: Diagnosis not present

## 2015-08-09 DIAGNOSIS — M21372 Foot drop, left foot: Secondary | ICD-10-CM

## 2015-08-09 DIAGNOSIS — R600 Localized edema: Secondary | ICD-10-CM | POA: Diagnosis not present

## 2015-08-09 DIAGNOSIS — E049 Nontoxic goiter, unspecified: Secondary | ICD-10-CM

## 2015-08-09 DIAGNOSIS — E041 Nontoxic single thyroid nodule: Secondary | ICD-10-CM

## 2015-08-09 MED ORDER — LISINOPRIL 40 MG PO TABS: 40.0000 mg | ORAL_TABLET | Freq: Every day | ORAL | Status: DC

## 2015-08-09 MED ORDER — HYDROCHLOROTHIAZIDE 25 MG PO TABS
25.0000 mg | ORAL_TABLET | Freq: Every day | ORAL | Status: DC
Start: 2015-08-09 — End: 2015-12-06

## 2015-08-09 MED ORDER — METOPROLOL TARTRATE 100 MG PO TABS: 200.0000 mg | ORAL_TABLET | Freq: Two times a day (BID) | ORAL | Status: DC

## 2015-08-09 MED ORDER — HYDROCHLOROTHIAZIDE 25 MG PO TABS
25.0000 mg | ORAL_TABLET | Freq: Every day | ORAL | Status: DC
Start: 2015-08-09 — End: 2015-08-09

## 2015-08-09 MED ORDER — FUROSEMIDE 20 MG PO TABS: ORAL_TABLET | ORAL | Status: DC

## 2015-08-09 NOTE — Patient Instructions (Addendum)
Referral to neurosurgery, podiatry and radiology for biopsy of thyroid placed, you should hear from [place] about appointment times  Bring home Blood pressure readings to next clinic visits  Return to clinic 2-4 weeks  Start new blood pressure medicine

## 2015-08-09 NOTE — Progress Notes (Signed)
Subjective:    Patient ID: Anthony Logan, male    DOB: September 06, 1960, 55 y.o.   MRN: [id number]  CC: New Patient (Initial Visit); Goiter; and Callouses   HPI: Anthony Logan is a 55 y.o. male presenting for New Patient (Initial Visit); Goiter; and Callouses  Renal mass: Has upcoming nephrectomy 4/10. Followed by [facility]   4 bulging discs in back, L foot numb, move. Started in January, felt something pop while laying in bed. Lots of pain when he stood up, noticed then that he couldn't wiggle toes on L foot. Pain is gone, but still cant move L foot and L foot is numb. Trips on things with L foot due to foot drop  H/o multi-nodular goiter with 4.9cm thyroid nodule, FNA recommended. No symptoms, breathing fine, swallowing without difficulties, not able to see prior thyroid labs in system  HTN: takes 200mg  BID of metoprolol, 40mg  lisinopril once a day Also on lasix 40mg  in am, 20mg  in pm. Has improved swellin gsince increased dose. Takes BP at home, better than at clinic but almost all numbers over [id number] systolic per pt.  No heart problems, no lung problems, smokes a few cigarettes a day when stressed.  Fam hx: brother with thyroid ca Mother, sister also with thyroid disease, sister had hers removed, pt isnt sure what for  Depression screen [facility] 2/9 08/09/2015  Decreased Interest 0  Down, Depressed, Hopeless 0  PHQ - 2 Score 0    ROS: All systems negative other than what is in HPI   Past Medical History Patient Active Problem List   Diagnosis Date Noted  . Right renal mass   . Edema 07/03/2015  . Abnormal MRI, lumbar spine 06/16/2015  . Radiculopathy of lumbar region 06/14/2015  . Mass of right kidney 06/13/2015  . Goiter 06/13/2015  . Proteinuria 03/27/2015  . Hyperlipidemia 03/27/2015  . Morbid obesity ([place]) 03/27/2015  . Mild dilation of ascending aorta ([facility]) 06/01/2013  . Essential hypertension, benign 02/15/2013  . Precordial pain 02/15/2013  . Abnormal ECG 02/15/2013  .  Mixed hyperlipidemia 02/15/2013   Family History  Problem Relation Age of Onset  . Hypertension Mother   . Diabetes Mother   . Breast cancer Mother   . Diabetes Father   . Heart disease Father     Diagnosed in his 28s   Social History   Social History  . Marital Status: Single    Spouse Name: N/A  . Number of Children: N/A  . Years of Education: N/A   Occupational History  . Unemployed    Social History Main Topics  . Smoking status: Former Smoker -- 30 years    Types: Cigarettes    Quit date: 02/14/2012  . Smokeless tobacco: Not on file     Comment: states he is smoking about 4 daily  . Alcohol Use: 0.0 oz/week    0 Standard drinks or equivalent per week     Comment: Occasionally on the weekends  . Drug Use: No  . Sexual Activity: Not on file   Other Topics Concern  . Not on file   Social History Narrative     Current Outpatient Prescriptions  Medication Sig Dispense Refill  . aspirin 81 MG tablet Take 81 mg by mouth at bedtime.     . Cholecalciferol (VITAMIN D-3 PO) Take 1,000 Units by mouth daily.    . fish oil-omega-3 fatty acids 1000 MG capsule Take 2 g by mouth 2 (two) times daily.     Marland Kitchen  furosemide (LASIX) 20 MG tablet Take 20-40 mg by mouth See admin instructions. Take 40mg  in the morning and 20mg  in the afternoon    . lisinopril (PRINIVIL,ZESTRIL) 40 MG tablet TAKE 1 Tablet BY MOUTH DAILY 90 tablet 0  . metoprolol (LOPRESSOR) 100 MG tablet TAKE 2 Tablets BY MOUTH TWICE DAILY 360 tablet 0  . Multiple Vitamin (MULTI VITAMIN DAILY PO) Take 1 tablet by mouth 2 (two) times a week.     . naproxen sodium (ANAPROX) 220 MG tablet Take 440 mg by mouth daily as needed (pain, headache).      No current facility-administered medications for this visit.       Objective:    BP 178/89 mmHg  Pulse 50  Temp(Src) 96.9 F (36.1 C) (Oral)  Ht 6\' 5"  (1.956 m)  Wt 359 lb 3.2 oz (162.932 kg)  BMI 42.59 kg/m2  Wt Readings from Last 3 Encounters:  08/09/15 359 lb 3.2  oz (162.932 kg)  07/12/15 358 lb 2 oz (162.444 kg)  07/03/15 358 lb (162.388 kg)   Recheck BP 158/90   Gen: NAD, alert, cooperative with exam, NCAT EYES: EOMI, no scleral injection or icterus ENT:   OP without erythema LYMPH: no cervical LAD Neck: no palpable nodules, limited by body habitus CV: NRRR, normal S1/S2, no murmur, distal pulses 2+ b/l Resp: CTABL, no wheezes, normal WOB Abd: +BS, soft, NTND.  Ext: trace pitting edema b/l LE Neuro: Alert and oriented, 3-4/5 strength L ankle flexion, 5/5 right side 4/5 ankle inversion/eversion and plantar flexion L ankle. Decreased sensation to touch and monofilament L sole of foot, coordination grossly normal Skin: L medial great toe with 1cm callous with central 3-69mm area of dried black blood. No surrounding erythema, no tenderness around area but no feeling in area either to touch or monofilament     Assessment & Plan:    Shafiq was seen today for new patient (initial visit), goiter and callouses.  Diagnoses and all orders for this visit:  Essential hypertension, benign Poorly controlled. Continue current medicines, add HCTZ. -     metoprolol (LOPRESSOR) 100 MG tablet; Take 2 tablets (200 mg total) by mouth 2 (two) times daily. -     lisinopril (PRINIVIL,ZESTRIL) 40 MG tablet; Take 1 tablet (40 mg total) by mouth daily. -     hydrochlorothiazide (HYDRODIURIL) 25 MG tablet; Take 1 tablet (25 mg total) by mouth daily. -     furosemide (LASIX) 20 MG tablet; Take 40mg  in the morning and 20mg  in the afternoon  Goiter with thyroid nodule Asymptomatic, 4.9 cm nodule, FNA recommended Pt to get labs from prior clinic, not sure if all labs are in Epic or not -     Ambulatory referral to Interventional Radiology for FNA -     TSH  Radiculopathy of lumbar region Numbness and foot drop L foot new since Jan  -     Ambulatory referral to Neurosurgery  Localized edema On lasix, continue  Foot drop, left L toe bleeding callous  does not  appear to be infected -     Ambulatory referral to Podiatry  Follow up plan: Return in about 4 weeks (around 09/06/2015) for med follow up.  Assunta Found, MD [facility] 08/09/2015, 2:34 PM

## 2015-08-10 LAB — TSH: TSH: 0.752 u[IU]/mL (ref 0.450–4.500)

## 2015-08-13 ENCOUNTER — Telehealth: Payer: Self-pay | Admitting: Student

## 2015-08-13 NOTE — Telephone Encounter (Signed)
Pt was called on 08-02-15. Pt stated he had a new patient appt with [facility] 08-02-15. Pt was informed that he could not go to 2 primary care offices. Pt understood and stated he would be continuing his care at [facility]. Pt was informed that his appt with [facility] would be cancelled and will no longer be considered a pt. Pt understood. Pt's appt and referrals from [facility] have been cancelled / transfered.

## 2015-08-16 ENCOUNTER — Other Ambulatory Visit: Payer: Self-pay | Admitting: Pediatrics

## 2015-08-16 DIAGNOSIS — R221 Localized swelling, mass and lump, neck: Secondary | ICD-10-CM

## 2015-08-21 NOTE — Patient Instructions (Addendum)
Anthony Logan  08/21/2015    Your procedure is scheduled on: 08-27-15  Report to [facility]  Entrance take Anthony Logan  elevators to 3rd floor to  Anthony Logan at  530 AM.  Call this number if you have problems the morning of surgery [phone]   Remember: ONLY 1 PERSON MAY GO WITH YOU TO SHORT STAY TO GET  READY MORNING OF Anthony Logan.  Do not eat food  :After Midnight. Saturday night, clear liquids all day Sunday, no clear liquids after midnight Sunday night, follow all bowel prep instructions from [name]      Take these medicines the morning of surgery with A SIP OF WATER: METOPROLOL              You may not have any metal on your body including hair pins and              piercings  Do not wear jewelry, make-up, lotions, powders or perfumes, deodorant             Do not wear nail polish.  Do not shave  48 hours prior to surgery.              Men may shave face and neck.   Do not bring valuables to the hospital. [place].  Contacts, dentures or bridgework may not be worn into surgery.  Leave suitcase in the car. After surgery it may be brought to your room.     Patients discharged the day of surgery will not be allowed to drive home.  Name and phone number of your driver:  Special Instructions: N/A              Please read over the following fact sheets you were given: _____________________________________________________________________             Anthony Logan - Preparing for Surgery Before surgery, you can play an important role.  Because skin is not sterile, your skin needs to be as free of germs as possible.  You can reduce the number of germs on your skin by washing with CHG (chlorahexidine gluconate) soap before surgery.  CHG is an antiseptic cleaner which kills germs and bonds with the skin to continue killing germs even after washing. Please DO NOT use if you have an allergy to CHG or  antibacterial soaps.  If your skin becomes reddened/irritated stop using the CHG and inform your nurse when you arrive at Short Stay. Do not shave (including legs and underarms) for at least 48 hours prior to the first CHG shower.  You may shave your face/neck. Please follow these instructions carefully:  1.  Shower with CHG Soap the night before surgery and the  morning of Surgery.  2.  If you choose to wash your hair, wash your hair first as usual with your  normal  shampoo.  3.  After you shampoo, rinse your hair and body thoroughly to remove the  shampoo.                           4.  Use CHG as you would any other liquid soap.  You can apply chg directly  to the skin and wash  Gently with a scrungie or clean washcloth.  5.  Apply the CHG Soap to your body ONLY FROM THE NECK DOWN.   Do not use on face/ open                           Wound or open sores. Avoid contact with eyes, ears mouth and genitals (private parts).                       Wash face,  Genitals (private parts) with your normal soap.             6.  Wash thoroughly, paying special attention to the area where your surgery  will be performed.  7.  Thoroughly rinse your body with warm water from the neck down.  8.  DO NOT shower/wash with your normal soap after using and rinsing off  the CHG Soap.                9.  Pat yourself dry with a clean towel.            10.  Wear clean pajamas.            11.  Place clean sheets on your bed the night of your first shower and do not  sleep with pets. Day of Surgery : Do not apply any lotions/deodorants the morning of surgery.  Please wear clean clothes to the hospital/surgery Logan.  FAILURE TO FOLLOW THESE INSTRUCTIONS MAY RESULT IN THE CANCELLATION OF YOUR SURGERY PATIENT SIGNATURE_________________________________  NURSE SIGNATURE__________________________________  ________________________________________________________________________

## 2015-08-21 NOTE — Progress Notes (Signed)
CHEST CT 06-05-15 EPIC ECHO 04-23-15 EPIC LOV CARDIO 10-27-14 DR MCDOWELL

## 2015-08-22 ENCOUNTER — Ambulatory Visit (HOSPITAL_COMMUNITY)
Admission: RE | Admit: 2015-08-22 | Discharge: 2015-08-22 | Disposition: A | Discharge status: Home / Self Care | Payer: Medicaid Other | Source: Ambulatory Visit | Attending: Pediatrics | Admitting: Pediatrics

## 2015-08-22 ENCOUNTER — Ambulatory Visit: Payer: Self-pay | Admitting: Physician Assistant

## 2015-08-22 DIAGNOSIS — R221 Localized swelling, mass and lump, neck: Secondary | ICD-10-CM | POA: Insufficient documentation

## 2015-08-22 DIAGNOSIS — E041 Nontoxic single thyroid nodule: Secondary | ICD-10-CM | POA: Diagnosis not present

## 2015-08-22 HISTORY — PX: OTHER SURGICAL HISTORY: SHX169

## 2015-08-22 MED ORDER — LIDOCAINE HCL (PF) 2 % IJ SOLN
INTRAMUSCULAR | Status: AC
  Administered 2015-08-22: 10 mL
  Filled 2015-08-22: qty 10

## 2015-08-22 NOTE — Discharge Instructions (Signed)

## 2015-08-23 ENCOUNTER — Encounter (HOSPITAL_COMMUNITY): Payer: Self-pay

## 2015-08-23 ENCOUNTER — Encounter (HOSPITAL_COMMUNITY)
Admission: RE | Admit: 2015-08-23 | Discharge: 2015-08-23 | Disposition: A | Discharge status: Home / Self Care | Payer: Medicaid Other | Source: Ambulatory Visit | Attending: Urology | Admitting: Urology

## 2015-08-23 DIAGNOSIS — Z0183 Encounter for blood typing: Secondary | ICD-10-CM | POA: Diagnosis not present

## 2015-08-23 DIAGNOSIS — R001 Bradycardia, unspecified: Secondary | ICD-10-CM | POA: Diagnosis not present

## 2015-08-23 DIAGNOSIS — N2889 Other specified disorders of kidney and ureter: Secondary | ICD-10-CM | POA: Insufficient documentation

## 2015-08-23 DIAGNOSIS — Z01818 Encounter for other preprocedural examination: Secondary | ICD-10-CM | POA: Diagnosis present

## 2015-08-23 DIAGNOSIS — Z01812 Encounter for preprocedural laboratory examination: Secondary | ICD-10-CM | POA: Insufficient documentation

## 2015-08-23 HISTORY — DX: Non-pressure chronic ulcer of skin of other sites with unspecified severity: L98.499

## 2015-08-23 HISTORY — DX: Pain in unspecified foot: M79.673

## 2015-08-23 HISTORY — DX: Anesthesia of skin: R20.0

## 2015-08-23 HISTORY — DX: Chronic kidney disease, unspecified: N18.9

## 2015-08-23 HISTORY — DX: Unspecified osteoarthritis, unspecified site: M19.90

## 2015-08-23 HISTORY — DX: Sleep apnea, unspecified: G47.30

## 2015-08-23 HISTORY — DX: Other specified disorders of kidney and ureter: N28.89

## 2015-08-23 LAB — CBC
HCT: 46 % (ref 39.0–52.0)
Hemoglobin: 16.2 g/dL (ref 13.0–17.0)
MCH: 31.5 pg (ref 26.0–34.0)
MCHC: 35.2 g/dL (ref 30.0–36.0)
MCV: 89.3 fL (ref 78.0–100.0)
PLATELETS: 329 10*3/uL (ref 150–400)
RBC: 5.15 MIL/uL (ref 4.22–5.81)
RDW: 12.6 % (ref 11.5–15.5)
WBC: 10.2 10*3/uL (ref 4.0–10.5)

## 2015-08-23 LAB — BASIC METABOLIC PANEL
Anion gap: 12 (ref 5–15)
BUN: 17 mg/dL (ref 6–20)
CALCIUM: 9.9 mg/dL (ref 8.9–10.3)
CHLORIDE: 99 mmol/L — AB (ref 101–111)
CO2: 32 mmol/L (ref 22–32)
CREATININE: 0.85 mg/dL (ref 0.61–1.24)
GFR calc Af Amer: >60 mL/min (ref >60)
GFR calc non Af Amer: >60 mL/min (ref >60)
GLUCOSE: 142 mg/dL — AB (ref 65–99)
Potassium: 3.8 mmol/L (ref 3.5–5.1)
Sodium: 143 mmol/L (ref 135–145)

## 2015-08-23 LAB — ABO/RH: ABO/RH(D): O POS

## 2015-08-23 NOTE — Progress Notes (Signed)
Ordered barimax bed with trapeze from mary in portable equipment

## 2015-08-26 MED ORDER — DEXTROSE 5 % IV SOLN
3.0000 g | INTRAVENOUS | Status: AC
  Administered 2015-08-27: 3 g via INTRAVENOUS
  Filled 2015-08-26 (×2): qty 3000

## 2015-08-27 ENCOUNTER — Encounter (HOSPITAL_COMMUNITY): Payer: Self-pay | Admitting: *Deleted

## 2015-08-27 ENCOUNTER — Inpatient Hospital Stay (HOSPITAL_COMMUNITY): Payer: Medicaid Other | Admitting: Anesthesiology

## 2015-08-27 ENCOUNTER — Encounter (HOSPITAL_COMMUNITY)
Admission: RE | Disposition: A | Discharge status: Home / Self Care | Payer: Self-pay | Source: Ambulatory Visit | Attending: Urology

## 2015-08-27 ENCOUNTER — Inpatient Hospital Stay (HOSPITAL_COMMUNITY)
Admission: RE | Admit: 2015-08-27 | Discharge: 2015-08-29 | DRG: 658 | Disposition: A | Discharge status: Home / Self Care | Payer: Medicaid Other | Source: Ambulatory Visit | Attending: Urology | Admitting: Urology

## 2015-08-27 DIAGNOSIS — Z79899 Other long term (current) drug therapy: Secondary | ICD-10-CM

## 2015-08-27 DIAGNOSIS — E785 Hyperlipidemia, unspecified: Secondary | ICD-10-CM | POA: Diagnosis present

## 2015-08-27 DIAGNOSIS — I129 Hypertensive chronic kidney disease with stage 1 through stage 4 chronic kidney disease, or unspecified chronic kidney disease: Secondary | ICD-10-CM | POA: Diagnosis present

## 2015-08-27 DIAGNOSIS — Z01812 Encounter for preprocedural laboratory examination: Secondary | ICD-10-CM

## 2015-08-27 DIAGNOSIS — Z8249 Family history of ischemic heart disease and other diseases of the circulatory system: Secondary | ICD-10-CM | POA: Diagnosis not present

## 2015-08-27 DIAGNOSIS — N2889 Other specified disorders of kidney and ureter: Secondary | ICD-10-CM | POA: Diagnosis present

## 2015-08-27 DIAGNOSIS — Z7982 Long term (current) use of aspirin: Secondary | ICD-10-CM | POA: Diagnosis not present

## 2015-08-27 DIAGNOSIS — Z803 Family history of malignant neoplasm of breast: Secondary | ICD-10-CM

## 2015-08-27 DIAGNOSIS — Z87891 Personal history of nicotine dependence: Secondary | ICD-10-CM

## 2015-08-27 DIAGNOSIS — N189 Chronic kidney disease, unspecified: Secondary | ICD-10-CM | POA: Diagnosis present

## 2015-08-27 DIAGNOSIS — G4730 Sleep apnea, unspecified: Secondary | ICD-10-CM | POA: Diagnosis present

## 2015-08-27 DIAGNOSIS — I739 Peripheral vascular disease, unspecified: Secondary | ICD-10-CM | POA: Diagnosis present

## 2015-08-27 DIAGNOSIS — C641 Malignant neoplasm of right kidney, except renal pelvis: Secondary | ICD-10-CM | POA: Diagnosis present

## 2015-08-27 HISTORY — PX: ROBOT ASSISTED LAPAROSCOPIC NEPHRECTOMY: SHX5140

## 2015-08-27 LAB — BASIC METABOLIC PANEL
Anion gap: 11 (ref 5–15)
BUN: 18 mg/dL (ref 6–20)
CO2: 25 mmol/L (ref 22–32)
Calcium: 8.5 mg/dL — ABNORMAL LOW (ref 8.9–10.3)
Chloride: 102 mmol/L (ref 101–111)
Creatinine, Ser: 1.1 mg/dL (ref 0.61–1.24)
GFR calc Af Amer: >60 mL/min (ref >60)
GLUCOSE: 173 mg/dL — AB (ref 65–99)
POTASSIUM: 4.3 mmol/L (ref 3.5–5.1)
Sodium: 138 mmol/L (ref 135–145)

## 2015-08-27 LAB — CBC
HEMATOCRIT: 44.6 % (ref 39.0–52.0)
Hemoglobin: 15.2 g/dL (ref 13.0–17.0)
MCH: 31.3 pg (ref 26.0–34.0)
MCHC: 34.1 g/dL (ref 30.0–36.0)
MCV: 91.8 fL (ref 78.0–100.0)
PLATELETS: 277 10*3/uL (ref 150–400)
RBC: 4.86 MIL/uL (ref 4.22–5.81)
RDW: 13 % (ref 11.5–15.5)
WBC: 16.4 10*3/uL — ABNORMAL HIGH (ref 4.0–10.5)

## 2015-08-27 LAB — TYPE AND SCREEN
ABO/RH(D): O POS
Antibody Screen: NEGATIVE

## 2015-08-27 SURGERY — NEPHRECTOMY, RADICAL, ROBOT-ASSISTED, LAPAROSCOPIC, ADULT
Anesthesia: General | Laterality: Right

## 2015-08-27 MED ORDER — SUGAMMADEX SODIUM 500 MG/5ML IV SOLN
INTRAVENOUS | Status: DC | PRN
  Administered 2015-08-27: 330 mg via INTRAVENOUS

## 2015-08-27 MED ORDER — SODIUM CHLORIDE 0.9 % IJ SOLN
INTRAMUSCULAR | Status: AC
  Filled 2015-08-27: qty 50

## 2015-08-27 MED ORDER — SUGAMMADEX SODIUM 500 MG/5ML IV SOLN
INTRAVENOUS | Status: AC
  Filled 2015-08-27: qty 5

## 2015-08-27 MED ORDER — HYDROMORPHONE HCL 1 MG/ML IJ SOLN
INTRAMUSCULAR | Status: DC | PRN
  Administered 2015-08-27: 1 mg via INTRAVENOUS

## 2015-08-27 MED ORDER — ROCURONIUM BROMIDE 100 MG/10ML IV SOLN
INTRAVENOUS | Status: AC
  Filled 2015-08-27: qty 1

## 2015-08-27 MED ORDER — BUPIVACAINE LIPOSOME 1.3 % IJ SUSP
20.0000 mL | Freq: Once | INTRAMUSCULAR | Status: AC
  Administered 2015-08-27: 20 mL
  Filled 2015-08-27: qty 20

## 2015-08-27 MED ORDER — OXYCODONE-ACETAMINOPHEN 5-325 MG PO TABS
1.0000 | ORAL_TABLET | ORAL | Status: DC | PRN
  Administered 2015-08-28 – 2015-08-29 (×4): 2 via ORAL
  Filled 2015-08-27 (×4): qty 2

## 2015-08-27 MED ORDER — LIDOCAINE HCL (CARDIAC) 20 MG/ML IV SOLN
INTRAVENOUS | Status: AC
  Filled 2015-08-27: qty 5

## 2015-08-27 MED ORDER — ASPIRIN EC 81 MG PO TBEC
81.0000 mg | DELAYED_RELEASE_TABLET | Freq: Every day | ORAL | Status: DC
  Administered 2015-08-27 – 2015-08-28 (×2): 81 mg via ORAL
  Filled 2015-08-27 (×2): qty 1

## 2015-08-27 MED ORDER — SODIUM CHLORIDE 0.9 % IJ SOLN
INTRAMUSCULAR | Status: AC
  Filled 2015-08-27: qty 10

## 2015-08-27 MED ORDER — PROPOFOL 10 MG/ML IV BOLUS
INTRAVENOUS | Status: DC | PRN
  Administered 2015-08-27: 20 mg via INTRAVENOUS
  Administered 2015-08-27: 200 mg via INTRAVENOUS

## 2015-08-27 MED ORDER — SODIUM CHLORIDE 0.9 % IJ SOLN
INTRAMUSCULAR | Status: DC | PRN
  Administered 2015-08-27: 20 mL

## 2015-08-27 MED ORDER — OXYCODONE-ACETAMINOPHEN 5-325 MG PO TABS: 1.0000 | ORAL_TABLET | Freq: Four times a day (QID) | ORAL | Status: DC | PRN

## 2015-08-27 MED ORDER — LACTATED RINGERS IV SOLN: INTRAVENOUS | Status: DC

## 2015-08-27 MED ORDER — CEFAZOLIN SODIUM 1-5 GM-% IV SOLN
1.0000 g | Freq: Three times a day (TID) | INTRAVENOUS | Status: AC
  Administered 2015-08-27 (×2): 1 g via INTRAVENOUS
  Filled 2015-08-27 (×2): qty 50

## 2015-08-27 MED ORDER — LACTATED RINGERS IR SOLN
Status: DC | PRN
  Administered 2015-08-27: 1000 mL

## 2015-08-27 MED ORDER — ZOLPIDEM TARTRATE 5 MG PO TABS: 5.0000 mg | ORAL_TABLET | Freq: Every evening | ORAL | Status: DC | PRN

## 2015-08-27 MED ORDER — PROPOFOL 10 MG/ML IV BOLUS
INTRAVENOUS | Status: AC
  Filled 2015-08-27: qty 40

## 2015-08-27 MED ORDER — MIDAZOLAM HCL 2 MG/2ML IJ SOLN
INTRAMUSCULAR | Status: AC
  Filled 2015-08-27: qty 2

## 2015-08-27 MED ORDER — STERILE WATER FOR IRRIGATION IR SOLN
Status: DC | PRN
  Administered 2015-08-27: 1000 mL

## 2015-08-27 MED ORDER — LABETALOL HCL 5 MG/ML IV SOLN
INTRAVENOUS | Status: DC | PRN
  Administered 2015-08-27: 5 mg via INTRAVENOUS

## 2015-08-27 MED ORDER — HYDROMORPHONE HCL 1 MG/ML IJ SOLN
INTRAMUSCULAR | Status: AC
  Administered 2015-08-27: 0.5 mg via INTRAVENOUS
  Filled 2015-08-27: qty 1

## 2015-08-27 MED ORDER — ACETAMINOPHEN 325 MG PO TABS: 650.0000 mg | ORAL_TABLET | ORAL | Status: DC | PRN

## 2015-08-27 MED ORDER — DIPHENHYDRAMINE HCL 12.5 MG/5ML PO ELIX: 12.5000 mg | ORAL_SOLUTION | Freq: Four times a day (QID) | ORAL | Status: DC | PRN

## 2015-08-27 MED ORDER — MEPERIDINE HCL 50 MG/ML IJ SOLN: 6.2500 mg | INTRAMUSCULAR | Status: DC | PRN

## 2015-08-27 MED ORDER — DIPHENHYDRAMINE HCL 50 MG/ML IJ SOLN: 12.5000 mg | Freq: Four times a day (QID) | INTRAMUSCULAR | Status: DC | PRN

## 2015-08-27 MED ORDER — MIDAZOLAM HCL 5 MG/5ML IJ SOLN
INTRAMUSCULAR | Status: DC | PRN
  Administered 2015-08-27: 2 mg via INTRAVENOUS

## 2015-08-27 MED ORDER — LACTATED RINGERS IV SOLN
INTRAVENOUS | Status: DC | PRN
  Administered 2015-08-27: 07:00:00 via INTRAVENOUS

## 2015-08-27 MED ORDER — FUROSEMIDE 40 MG PO TABS
40.0000 mg | ORAL_TABLET | Freq: Every day | ORAL | Status: DC
  Administered 2015-08-28 – 2015-08-29 (×2): 40 mg via ORAL
  Filled 2015-08-27 (×2): qty 1

## 2015-08-27 MED ORDER — FENTANYL CITRATE (PF) 250 MCG/5ML IJ SOLN
INTRAMUSCULAR | Status: AC
  Filled 2015-08-27: qty 5

## 2015-08-27 MED ORDER — LACTATED RINGERS IV SOLN
INTRAVENOUS | Status: DC | PRN
  Administered 2015-08-27: 08:00:00 via INTRAVENOUS

## 2015-08-27 MED ORDER — ONDANSETRON HCL 4 MG/2ML IJ SOLN
INTRAMUSCULAR | Status: DC | PRN
  Administered 2015-08-27: 4 mg via INTRAVENOUS

## 2015-08-27 MED ORDER — FUROSEMIDE 20 MG PO TABS: 20.0000 mg | ORAL_TABLET | Freq: Two times a day (BID) | ORAL | Status: DC

## 2015-08-27 MED ORDER — SODIUM CHLORIDE 0.9 % IV SOLN
INTRAVENOUS | Status: DC
Start: 2015-08-27 — End: 2015-08-29
  Administered 2015-08-27 – 2015-08-28 (×4): via INTRAVENOUS

## 2015-08-27 MED ORDER — FENTANYL CITRATE (PF) 100 MCG/2ML IJ SOLN
INTRAMUSCULAR | Status: DC | PRN
  Administered 2015-08-27 (×5): 50 ug via INTRAVENOUS

## 2015-08-27 MED ORDER — HYDROMORPHONE HCL 1 MG/ML IJ SOLN
0.5000 mg | INTRAMUSCULAR | Status: DC | PRN
  Administered 2015-08-27 – 2015-08-28 (×5): 1 mg via INTRAVENOUS
  Filled 2015-08-27 (×5): qty 1

## 2015-08-27 MED ORDER — BELLADONNA ALKALOIDS-OPIUM 16.2-60 MG RE SUPP: 1.0000 | Freq: Four times a day (QID) | RECTAL | Status: DC | PRN

## 2015-08-27 MED ORDER — HYDROMORPHONE HCL 2 MG/ML IJ SOLN
INTRAMUSCULAR | Status: AC
  Filled 2015-08-27: qty 1

## 2015-08-27 MED ORDER — ONDANSETRON HCL 4 MG/2ML IJ SOLN
4.0000 mg | INTRAMUSCULAR | Status: DC | PRN
  Administered 2015-08-27: 4 mg via INTRAVENOUS
  Filled 2015-08-27: qty 2

## 2015-08-27 MED ORDER — METOPROLOL TARTRATE 50 MG PO TABS
200.0000 mg | ORAL_TABLET | Freq: Two times a day (BID) | ORAL | Status: DC
  Administered 2015-08-27 – 2015-08-29 (×4): 200 mg via ORAL
  Filled 2015-08-27 (×4): qty 4

## 2015-08-27 MED ORDER — ROCURONIUM BROMIDE 100 MG/10ML IV SOLN
INTRAVENOUS | Status: DC | PRN
  Administered 2015-08-27: 30 mg via INTRAVENOUS
  Administered 2015-08-27 (×2): 20 mg via INTRAVENOUS
  Administered 2015-08-27: 40 mg via INTRAVENOUS
  Administered 2015-08-27: 20 mg via INTRAVENOUS

## 2015-08-27 MED ORDER — LIDOCAINE HCL (CARDIAC) 20 MG/ML IV SOLN
INTRAVENOUS | Status: DC | PRN
  Administered 2015-08-27: 100 mg via INTRAVENOUS

## 2015-08-27 MED ORDER — FUROSEMIDE 20 MG PO TABS
20.0000 mg | ORAL_TABLET | Freq: Every day | ORAL | Status: DC
  Filled 2015-08-27 (×2): qty 1

## 2015-08-27 MED ORDER — SUCCINYLCHOLINE CHLORIDE 20 MG/ML IJ SOLN
INTRAMUSCULAR | Status: DC | PRN
  Administered 2015-08-27: 160 mg via INTRAVENOUS

## 2015-08-27 MED ORDER — HYDROMORPHONE HCL 1 MG/ML IJ SOLN
0.2500 mg | INTRAMUSCULAR | Status: DC | PRN
  Administered 2015-08-27 (×2): 0.5 mg via INTRAVENOUS

## 2015-08-27 MED ORDER — ONDANSETRON HCL 4 MG/2ML IJ SOLN
INTRAMUSCULAR | Status: AC
  Filled 2015-08-27: qty 2

## 2015-08-27 MED ORDER — EPHEDRINE SULFATE 50 MG/ML IJ SOLN
INTRAMUSCULAR | Status: AC
  Filled 2015-08-27: qty 1

## 2015-08-27 MED ORDER — LACTATED RINGERS IV SOLN
INTRAVENOUS | Status: DC
  Administered 2015-08-27: 12:00:00 via INTRAVENOUS

## 2015-08-27 SURGICAL SUPPLY — 55 items
CHLORAPREP W/TINT 26ML (MISCELLANEOUS) ×2 IMPLANT
CLIP LIGATING HEM O LOK PURPLE (MISCELLANEOUS) IMPLANT
CLIP LIGATING HEMO LOK XL GOLD (MISCELLANEOUS) ×1 IMPLANT
CLIP LIGATING HEMO O LOK GREEN (MISCELLANEOUS) ×1 IMPLANT
COVER SURGICAL LIGHT HANDLE (MISCELLANEOUS) ×2 IMPLANT
COVER TIP SHEARS 8 DVNC (MISCELLANEOUS) ×1 IMPLANT
COVER TIP SHEARS 8MM DA VINCI (MISCELLANEOUS) ×1
DECANTER SPIKE VIAL GLASS SM (MISCELLANEOUS) ×2 IMPLANT
DRAIN CHANNEL 15F RND FF 3/16 (WOUND CARE) ×1 IMPLANT
DRAPE ARM DVNC X/XI (DISPOSABLE) ×4 IMPLANT
DRAPE COLUMN DVNC XI (DISPOSABLE) ×1 IMPLANT
DRAPE DA VINCI XI ARM (DISPOSABLE) ×4
DRAPE DA VINCI XI COLUMN (DISPOSABLE) ×1
DRAPE INCISE IOBAN 66X45 STRL (DRAPES) ×2 IMPLANT
DRAPE SHEET LG 3/4 BI-LAMINATE (DRAPES) ×2 IMPLANT
DRAPE WARM FLUID 44X44 (DRAPE) ×1 IMPLANT
ELECT PENCIL ROCKER SW 15FT (MISCELLANEOUS) ×2 IMPLANT
ELECT REM PT RETURN 9FT ADLT (ELECTROSURGICAL) ×2
ELECTRODE REM PT RTRN 9FT ADLT (ELECTROSURGICAL) ×2 IMPLANT
EVACUATOR SILICONE 100CC (DRAIN) ×1 IMPLANT
GLOVE BIO SURGEON STRL SZ8 (GLOVE) ×4 IMPLANT
GLOVE BIOGEL PI IND STRL 8 (GLOVE) ×1 IMPLANT
GLOVE BIOGEL PI INDICATOR 8 (GLOVE) ×1
GOWN STRL REUS W/TWL LRG LVL3 (GOWN DISPOSABLE) ×4 IMPLANT
KIT BASIN OR (CUSTOM PROCEDURE TRAY) ×2 IMPLANT
LIQUID BAND (GAUZE/BANDAGES/DRESSINGS) ×3 IMPLANT
LOOP VESSEL MAXI BLUE (MISCELLANEOUS) IMPLANT
NDL INSUFFLATION 14GA 120MM (NEEDLE) ×1 IMPLANT
NEEDLE INSUFFLATION 14GA 120MM (NEEDLE) ×2 IMPLANT
POSITIONER SURGICAL ARM (MISCELLANEOUS) ×4 IMPLANT
POUCH ENDO CATCH II 15MM (MISCELLANEOUS) ×2 IMPLANT
RELOAD STAPLE 60 2.6 WHT THN (STAPLE) ×4 IMPLANT
RELOAD STAPLER WHITE 60MM (STAPLE) ×2 IMPLANT
SEAL CANN UNIV 5-8 DVNC XI (MISCELLANEOUS) ×4 IMPLANT
SEAL XI 5MM-8MM UNIVERSAL (MISCELLANEOUS) ×4
SET TUBE IRRIG SUCTION NO TIP (IRRIGATION / IRRIGATOR) ×2 IMPLANT
SOLUTION ELECTROLUBE (MISCELLANEOUS) ×2 IMPLANT
SPONGE LAP 4X18 X RAY DECT (DISPOSABLE) IMPLANT
STAPLE ECHEON FLEX 60 POW ENDO (STAPLE) ×1 IMPLANT
STAPLER RELOAD WHITE 60MM (STAPLE) ×4
SUT ETHILON 3 0 PS 1 (SUTURE) IMPLANT
SUT MNCRL AB 4-0 PS2 18 (SUTURE) ×5 IMPLANT
SUT PDS AB 1 TP1 96 (SUTURE) ×4 IMPLANT
SUT VIC AB 2-0 SH 27 (SUTURE) ×2
SUT VIC AB 2-0 SH 27X BRD (SUTURE) IMPLANT
SUT VICRYL 0 UR6 27IN ABS (SUTURE) ×3 IMPLANT
TAPE CLOTH 4X10 WHT NS (GAUZE/BANDAGES/DRESSINGS) ×2 IMPLANT
TAPE STRIPS DRAPE STRL (GAUZE/BANDAGES/DRESSINGS) ×2 IMPLANT
TOWEL OR NON WOVEN STRL DISP B (DISPOSABLE) ×2 IMPLANT
TRAY FOLEY W/METER SILVER 14FR (SET/KITS/TRAYS/PACK) IMPLANT
TRAY FOLEY W/METER SILVER 16FR (SET/KITS/TRAYS/PACK) ×1 IMPLANT
TRAY LAPAROSCOPIC (CUSTOM PROCEDURE TRAY) ×2 IMPLANT
TROCAR BLADELESS OPT 5 100 (ENDOMECHANICALS) ×1 IMPLANT
TROCAR XCEL 12X100 BLDLESS (ENDOMECHANICALS) ×2 IMPLANT
WATER STERILE IRR 1500ML POUR (IV SOLUTION) ×3 IMPLANT

## 2015-08-27 NOTE — Anesthesia Preprocedure Evaluation (Addendum)
Anesthesia Evaluation  Patient identified by MRN, date of birth, ID band Patient awake    Reviewed: Allergy & Precautions, NPO status , Patient's Chart, lab work & pertinent test results, reviewed documented beta blocker date and time   Airway Mallampati: II  TM Distance: >3 FB Neck ROM: Full    Dental  (+) Teeth Intact, Dental Advisory Given   Pulmonary sleep apnea , former smoker,    breath sounds clear to auscultation       Cardiovascular hypertension, Pt. on medications and Pt. on home beta blockers + Peripheral Vascular Disease   Rhythm:Regular Rate:Normal     Neuro/Psych  Neuromuscular disease negative psych ROS   GI/Hepatic negative GI ROS, Neg liver ROS,   Endo/Other  negative endocrine ROS  Renal/GU CRFRenal disease  negative genitourinary   Musculoskeletal  (+) Arthritis ,   Abdominal (+) + obese,  Abdomen: soft.    Peds negative pediatric ROS (+)  Hematology negative hematology ROS (+)   Anesthesia Other Findings -HLD -   Reproductive/Obstetrics negative OB ROS                            Lab Results  Component Value Date   WBC 10.2 08/23/2015   HGB 16.2 08/23/2015   HCT 46.0 08/23/2015   MCV 89.3 08/23/2015   PLT 329 08/23/2015   Lab Results  Component Value Date   CREATININE 0.85 08/23/2015   BUN 17 08/23/2015   NA 143 08/23/2015   K 3.8 08/23/2015   CL 99* 08/23/2015   CO2 32 08/23/2015   Lab Results  Component Value Date   INR 1.09 07/12/2015   08/2015 EKG: sinus bradycardia.  04/2015 Echo - Left ventricle: The cavity size was mildly dilated. There was severe concentric hypertrophy. Systolic function was normal. The estimated ejection fraction was in the range of 60% to 65%. Wall motion was normal; there were no regional wall motion abnormalities. Features are consistent with a pseudonormal left ventricular filling pattern, with concomitant abnormal  relaxation and increased filling pressure (grade 2 diastolic dysfunction). Doppler parameters are consistent with high ventricular filling pressure. - Aortic valve: Moderately calcified annulus. There was moderate regurgitation. Valve area (VTI): 5.88 cm^2. Valve area (Vmax): 4.63 cm^2. Valve area (Vmean): 4.63 cm^2. - Aorta: Moderate aortic root dilatation. Aortic root dimension: 49 mm (ED). - Mitral valve: Calcified annulus. Mildly thickened leaflets . - Left atrium: The atrium was mildly dilated.   Anesthesia Physical Anesthesia Plan  ASA: III  Anesthesia Plan: General   Post-op Pain Management:    Induction: Intravenous  Airway Management Planned: Oral ETT  Additional Equipment:   Intra-op Plan:   Post-operative Plan: Extubation in OR  Informed Consent: I have reviewed the patients History and Physical, chart, labs and discussed the procedure including the risks, benefits and alternatives for the proposed anesthesia with the patient or authorized representative who has indicated his/her understanding and acceptance.   Dental advisory given  Plan Discussed with: CRNA  Anesthesia Plan Comments:         Anesthesia Quick Evaluation

## 2015-08-27 NOTE — Anesthesia Procedure Notes (Signed)
Procedure Name: Intubation Date/Time: 08/27/2015 7:53 AM Performed by: Anne Fu Pre-anesthesia Checklist: Patient identified, Emergency Drugs available, Suction available and Patient being monitored Patient Re-evaluated:Patient Re-evaluated prior to inductionOxygen Delivery Method: Circle System Utilized Preoxygenation: Pre-oxygenation with 100% oxygen Intubation Type: IV induction Ventilation: Mask ventilation with difficulty and Oral airway inserted - appropriate to patient size Laryngoscope Size: Mac and 4 (MAC 3 attempted first; not long enough to reach epiglottis. MAC 4 second and able to view vocal cords) Grade View: Grade II Tube type: Oral Tube size: 7.5 mm Number of attempts: 1 Airway Equipment and Method: Stylet and Oral airway Placement Confirmation: ETT inserted through vocal cords under direct vision,  positive ETCO2 and breath sounds checked- equal and bilateral Tube secured with: Tape Dental Injury: Teeth and Oropharynx as per pre-operative assessment and Injury to lip  Comments: Top lip laceration (0.5x0.5 cm) midline with hemostasis immediately after.

## 2015-08-27 NOTE — Anesthesia Postprocedure Evaluation (Signed)
Anesthesia Post Note  Patient: Anthony Logan  Procedure(s) Performed: Procedure(s) (LRB): XI ROBOTIC ASSISTED LAPAROSCOPIC RIGHT RADICAL NEPHRECTOMY (Right)  Patient location during evaluation: PACU Anesthesia Type: General Level of consciousness: awake and alert Pain management: pain level controlled Vital Signs Assessment: post-procedure vital signs reviewed and stable Respiratory status: spontaneous breathing, nonlabored ventilation, respiratory function stable and patient connected to nasal cannula oxygen Cardiovascular status: blood pressure returned to baseline and stable Postop Assessment: no signs of nausea or vomiting Anesthetic complications: no    Last Vitals:  Filed Vitals:   08/27/15 1219 08/27/15 1220  BP:    Pulse: 83 84  Temp:    Resp: 18 20    Last Pain:  Filed Vitals:   08/27/15 1238  PainSc: [name]

## 2015-08-27 NOTE — Transfer of Care (Signed)
Immediate Anesthesia Transfer of Care Note  Patient: Anthony Logan  Procedure(s) Performed: Procedure(s): XI ROBOTIC ASSISTED LAPAROSCOPIC RIGHT RADICAL NEPHRECTOMY (Right)  Patient Location: PACU  Anesthesia Type:General  Level of Consciousness:  sedated, patient cooperative and responds to stimulation  Airway & Oxygen Therapy:Patient Spontanous Breathing and Patient connected to face mask oxgen  Post-op Assessment:  Report given to PACU RN and Post -op Vital signs reviewed and stable  Post vital signs:  Reviewed and stable  Last Vitals:  Filed Vitals:   08/27/15 0543  BP: 198/98  Pulse: 60  Temp: 36.7 C  Resp: 20    Complications: No apparent anesthesia complications

## 2015-08-27 NOTE — Discharge Instructions (Signed)
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
## 2015-08-27 NOTE — Brief Op Note (Signed)
08/27/2015  11:33 AM  PATIENT:  Anthony Logan  55 y.o. male  PRE-OPERATIVE DIAGNOSIS:  RIGHT RENAL MASS  POST-OPERATIVE DIAGNOSIS:  RIGHT RENAL MASS  PROCEDURE:  Procedure(s): XI ROBOTIC ASSISTED LAPAROSCOPIC RIGHT RADICAL NEPHRECTOMY (Right)  SURGEON:  Surgeon(s) and Role:    * Cleon Gustin, MD - Primary  PHYSICIAN ASSISTANT:   ASSISTANTS: Debbrah Alar, PA   ANESTHESIA:   general  EBL:  Total I/O In: -  Out: 25 [Urine:25]  BLOOD ADMINISTERED:none  DRAINS: Urinary Catheter (Foley)   LOCAL MEDICATIONS USED:  OTHER exparel  SPECIMEN:  Source of Specimen:  right kidney  DISPOSITION OF SPECIMEN:  PATHOLOGY  COUNTS:  YES  TOURNIQUET:  * No tourniquets in log *  DICTATION: .Note written in EPIC  PLAN OF CARE: Admit to inpatient   PATIENT DISPOSITION:  PACU - hemodynamically stable.   Delay start of Pharmacological VTE agent (>24hrs) due to surgical blood loss or risk of bleeding: not applicable

## 2015-08-27 NOTE — Op Note (Signed)
Preoperative diagnosis: Right renal mass  Postop diagnosis: Same  Procedure: 1.  right robot assisted laparoscopic radical nephrectomy  Attending: Nicolette Bang  Assistant: Debbrah Alar, PA  Anesthesia: General  Estimated blood loss: 50 cc  Drains: 16 French Foley catheter  Specimens: right radical nephrectomy  Antibiotics: ancef  Findings:  1 artery and 1 vein  Indications: Patient is a 55 year old with a history of 7 cm right renal mass.  The mass was not amenable to partial nephrectomy.  After discussing treatment options patient decided to proceed with right robot assisted laparoscopic radical nephrectomy.  Procedure in detail: Prior to procedure consent was obtained. Patient was brought to the operating room and briefing was done sure correct patient, correct procedure, correct site.  General anesthesia was in administered patient was placed in the left lateral decubitus position.  a 67 French catheter was placed. their abdomen and flank was then prepped and draped usual sterile fashion.  A Veress needle was used to obtain pneumoperitoneum.  Once pneumoperitoneum was reestablished to 15 mmHg we then placed a 8 mm camera port lateral to the umbilicus at the lateral edge of rectus.  We then proceeded to place 3 more robotic ports. We then placed an assistant port inferior to the camera in the midline. We then placed a 20mm port for the liver retractor in the midline above the assistant port. We then docked the robot.   We then dissected along the white line of [place].  We then reflected the colon medially.  We then proceeded to kocherize the duodenum. We then identified the psoas muscle.  Once this was done we traced it down to the iliac vessels and identified the ureter.  Once we identified the gonadal vein and ureter were then traced this to the renal hilum.  The renal vein and renal artery were skeletonized.  We did we identified one renal vein one renal artery.  Using the [employer]  power stapler within ligated the renal artery.  Once this was done we then used a second staple load to ligate the renal vein.  We then used a hem-o-lock clips to ligate the gonadal vein and the ureter.  Once this was done we then freed the kidney from its lateral and posterior attachments.  We then used a Endo Catch bag to remove the specimen.  Once the specimen was in the Endo Catch bag we then inspected the retroperitoneum and noted no residual bleeding.  We then removed our instruments, undocked the robot, and released the pneumoperitoneum.  We then made a right lower quardant incision to remove the specimen.  Once the specimen was removed we then closed the camera and assistant ports with 0 Vicryl in interrupted fashion.  We then closed the  Right lower quadrant incision with 0 PDS in a running fashion.  We then closed the overlying skin with 2-0 Vicryl in running fashion.  The skin was then closed with staples.  This then concluded the procedure which was well tolerated by the patient.  Complications: None  Condition: Stable, x-rayed, transferred to PACU.  Plan: Patient is to be admitted for inpatient stay. The foley catheter will be removed in the morning. They will be started on a clear liquid diet POD#1

## 2015-08-28 ENCOUNTER — Ambulatory Visit: Payer: Self-pay | Admitting: Physician Assistant

## 2015-08-28 LAB — CBC
HCT: 38.6 % — ABNORMAL LOW (ref 39.0–52.0)
HEMOGLOBIN: 13.3 g/dL (ref 13.0–17.0)
MCH: 31.1 pg (ref 26.0–34.0)
MCHC: 34.5 g/dL (ref 30.0–36.0)
MCV: 90.2 fL (ref 78.0–100.0)
Platelets: 224 10*3/uL (ref 150–400)
RBC: 4.28 MIL/uL (ref 4.22–5.81)
RDW: 13 % (ref 11.5–15.5)
WBC: 12.6 10*3/uL — AB (ref 4.0–10.5)

## 2015-08-28 LAB — BASIC METABOLIC PANEL
ANION GAP: 8 (ref 5–15)
BUN: 20 mg/dL (ref 6–20)
CHLORIDE: 99 mmol/L — AB (ref 101–111)
CO2: 29 mmol/L (ref 22–32)
Calcium: 8.2 mg/dL — ABNORMAL LOW (ref 8.9–10.3)
Creatinine, Ser: 1.28 mg/dL — ABNORMAL HIGH (ref 0.61–1.24)
GFR calc Af Amer: >60 mL/min (ref >60)
GFR calc non Af Amer: >60 mL/min (ref >60)
GLUCOSE: 123 mg/dL — AB (ref 65–99)
POTASSIUM: 3.8 mmol/L (ref 3.5–5.1)
Sodium: 136 mmol/L (ref 135–145)

## 2015-08-29 MED ORDER — HUGO ROLLING WALKER PREMIUM MISC: 1.0000 [IU] | Freq: Once | Status: DC

## 2015-08-29 NOTE — Progress Notes (Signed)
[facility]    [facility] is providing the following services: RW  If patient discharges after hours, please call [phone].   Linward Headland 08/29/2015, 10:27 AM

## 2015-08-29 NOTE — Care Management Note (Signed)
Case Management Note  Patient Details  Name: Anthony Logan MRN: [id number] Date of Birth: [birth year]  Subjective/Objective:                  XI ROBOTIC ASSISTED LAPAROSCOPIC RIGHT RADICAL NEPHRECTOMY (Right) Action/Plan: Discharge planning Expected Discharge Date:  08/29/15               Expected Discharge Plan:  Home/Self Care  In-House Referral:     Discharge planning Services  CM Consult  Post Acute Care Choice:    Choice offered to:  NA  DME Arranged:  [place] [facility] DME Agency:  [place]:  NA [place] Agency:     Status of Service:  Completed, signed off  Medicare Important Message Given:    Date Medicare IM Given:    Medicare IM give by:    Date Additional Medicare IM Given:    Additional Medicare Important Message give by:     If discussed at [place] of Stay Meetings, dates discussed:    Additional Comments: CM received request for bari rolling waker.  CM called [facility] DME rep, Lecretia to please deliver the rolling walker to room so pt can discharge.  No HH services recommended or ordered.  No other CM needs were communicated.  Dellie Catholic, RN 08/29/2015, 10:34 AM

## 2015-08-30 ENCOUNTER — Telehealth: Payer: Self-pay | Admitting: Pediatrics

## 2015-08-30 NOTE — Telephone Encounter (Signed)
Pt notified of results Verbalizes understanding 

## 2015-09-04 NOTE — Discharge Summary (Signed)
Physician Discharge Summary  Patient ID: Anthony Logan MRN: [id number] DOB/AGE: 1961-03-09 55 y.o.  Admit date: 08/27/2015 Discharge date: 08/29/2015  Admission Diagnoses: Renal mass Discharge Diagnoses:  Active Problems:   Renal mass   Discharged Condition: good  Hospital Course: The patient tolerated the procedure well and was transferred to the floor on IV pain meds, IV fluid. On POD#1 foley was removed, pt was started on clear liquid diet and they ambulated in the halls. On POD#2 the patient was transitioned to a regular diet, IVFs were discontinued, and the patient passed flatus. Prior to discharge the pt was tolerating a regular diet, pain was controlled on PO pain meds, they were ambulating without difficulty, and they had normal bowel function.   Consults: None  Significant Diagnostic Studies: none  Treatments: surgery: left robot assisted laparoscopic radical nephrectomy  Discharge Exam: Blood pressure 162/76, pulse 70, temperature 98.2 F (36.8 C), temperature source Oral, resp. rate 20, height 6\' 5"  (1.956 m), weight 161.027 kg (355 lb), SpO2 98 %. General appearance: alert, cooperative and appears stated age Head: Normocephalic, without obvious abnormality, atraumatic Eyes: conjunctivae/corneas clear. PERRL, EOM's intact. Fundi benign. Resp: clear to auscultation bilaterally GI: soft, non-tender; bowel sounds normal; no masses,  no organomegaly Neurologic: Alert and oriented X 3, normal strength and tone. Normal symmetric reflexes. Normal coordination and gait  Disposition: 01-Home or Self Care  Discharge Instructions    Walker rolling    Complete by:  As directed   Case worker to provide bariatric rolling walker            Medication List    STOP taking these medications        aspirin 81 MG tablet     fish oil-omega-3 fatty acids 1000 MG capsule     MULTI VITAMIN DAILY PO     naproxen sodium 220 MG tablet  Commonly known as:  ANAPROX     VITAMIN  D-3 PO      TAKE these medications        furosemide 20 MG tablet  Commonly known as:  LASIX  Take 40mg  in the morning and 20mg  in the afternoon     [place]  1 Units by Does not apply route once.     hydrochlorothiazide 25 MG tablet  Commonly known as:  HYDRODIURIL  Take 1 tablet (25 mg total) by mouth daily.     lisinopril 40 MG tablet  Commonly known as:  PRINIVIL,ZESTRIL  Take 1 tablet (40 mg total) by mouth daily.     metoprolol 100 MG tablet  Commonly known as:  LOPRESSOR  Take 2 tablets (200 mg total) by mouth 2 (two) times daily.     oxyCODONE-acetaminophen 5-325 MG tablet  Commonly known as:  ROXICET  Take 1-2 tablets by mouth every 6 (six) hours as needed for moderate pain or severe pain.           Follow-up Information    Follow up with Nicolette Bang, MD On 09/12/2015.   Specialty:  Urology   Why:  at 8:30   Contact information:   [address] [place] [address] [phone]       Follow up with [place].   Why:  rolling walker   Contact information:   [place] [address] [phone]       Signed: Nicolette Bang 09/04/2015, 11:43 AM

## 2015-09-11 ENCOUNTER — Encounter: Payer: Self-pay | Admitting: Family Medicine

## 2015-09-11 ENCOUNTER — Ambulatory Visit (INDEPENDENT_AMBULATORY_CARE_PROVIDER_SITE_OTHER): Payer: Medicaid Other | Admitting: Family Medicine

## 2015-09-11 VITALS — BP 167/90 | HR 56 | Temp 97.1°F | Ht 77.0 in | Wt 358.6 lb

## 2015-09-11 DIAGNOSIS — M5416 Radiculopathy, lumbar region: Secondary | ICD-10-CM

## 2015-09-11 DIAGNOSIS — I10 Essential (primary) hypertension: Secondary | ICD-10-CM | POA: Diagnosis not present

## 2015-09-11 DIAGNOSIS — Z0000 Encounter for general adult medical examination without abnormal findings: Secondary | ICD-10-CM | POA: Insufficient documentation

## 2015-09-11 MED ORDER — TRAMADOL HCL 50 MG PO TABS: 50.0000 mg | ORAL_TABLET | Freq: Three times a day (TID) | ORAL | Status: DC | PRN

## 2015-09-11 NOTE — Patient Instructions (Signed)
Great to see you!  I would recommend re-starting lisinopril and re-checking your kidney function in 3-4 weeks  Your kidney doctor may do this, If he does please get [place] a copy of the labs.   Hold off on HCTZ for now  Come back in 1 month

## 2015-09-11 NOTE — Progress Notes (Signed)
   HPI  Patient presents today for follow-up hypertension.  Patient's lines over the last month he's been off of lisinopril, he has not started HCTZ.  Is not check his blood pressure at home, no chest pain, dyspnea, palpitations, or new leg edema.  These has a recent history of nephrectomy for renal cancer. He has history of chronic back pain and has pain associated with the postsurgical condition as well  He has just run out of oxycodone, he states that the pain at night is keeping him awake, he is having good pain relief with 5 mg of oxycodone which also helps him sleep.  He would not like to do colonoscopy yet  PMH: Smoking status noted ROS: Per HPI  Objective: BP 167/90 mmHg  Pulse 56  Temp(Src) 97.1 F (36.2 C) (Oral)  Ht 6\' 5"  (1.956 m)  Wt 358 lb 9.6 oz (162.66 kg)  BMI 42.52 kg/m2 Gen: NAD, alert, cooperative with exam HEENT: NCAT CV: RRR, good S1/S2, no murmur Resp: CTABL, no wheezes, non-labored Ext: No edema, warm Neuro: Alert and oriented, No gross deficits  Assessment and plan:  # Hypertension Improved slightly, however still elevated Restart lisinopril, he will discuss this with his urologist tomorrow. Hold off of HCTZ for now, he also has an appointment with nephrology within the next 2 weeks. Return to clinic in one month,  # Chronic back pain, lumbar radiculopathy Try tramadol Discussed trying to reduce opiate use, he is still perioperative it may get a refill from his urologist tomorrow, I am okay with a small amount of narcotics if needed and if his surgeon feels that it's warranted  # HCM Does not want to pursue C scope yet, discussed, enocuraged to put on his to do list for this year   Meds ordered this encounter  Medications  . traMADol (ULTRAM) 50 MG tablet    Sig: Take 1 tablet (50 mg total) by mouth every 8 (eight) hours as needed.    Dispense:  60 tablet    Refill:  0    Laroy Apple, MD [facility] 09/11/2015, 9:27 AM

## 2015-09-11 NOTE — Addendum Note (Signed)
Addended by: Timmothy Euler on: 09/11/2015 09:35 AM   Modules accepted: Miquel Dunn

## 2015-09-12 ENCOUNTER — Ambulatory Visit (INDEPENDENT_AMBULATORY_CARE_PROVIDER_SITE_OTHER): Payer: Self-pay | Admitting: Urology

## 2015-09-12 DIAGNOSIS — L0390 Cellulitis, unspecified: Secondary | ICD-10-CM

## 2015-09-12 DIAGNOSIS — C641 Malignant neoplasm of right kidney, except renal pelvis: Secondary | ICD-10-CM

## 2015-09-17 ENCOUNTER — Ambulatory Visit (INDEPENDENT_AMBULATORY_CARE_PROVIDER_SITE_OTHER): Payer: Medicaid Other | Admitting: Sports Medicine

## 2015-09-17 ENCOUNTER — Encounter: Payer: Self-pay | Admitting: Sports Medicine

## 2015-09-17 DIAGNOSIS — M79675 Pain in left toe(s): Secondary | ICD-10-CM

## 2015-09-17 DIAGNOSIS — L97501 Non-pressure chronic ulcer of other part of unspecified foot limited to breakdown of skin: Secondary | ICD-10-CM | POA: Diagnosis not present

## 2015-09-17 DIAGNOSIS — M2142 Flat foot [pes planus] (acquired), left foot: Secondary | ICD-10-CM

## 2015-09-17 DIAGNOSIS — L97521 Non-pressure chronic ulcer of other part of left foot limited to breakdown of skin: Secondary | ICD-10-CM

## 2015-09-17 DIAGNOSIS — M2141 Flat foot [pes planus] (acquired), right foot: Secondary | ICD-10-CM

## 2015-09-17 DIAGNOSIS — M5432 Sciatica, left side: Secondary | ICD-10-CM

## 2015-09-17 MED ORDER — MUPIROCIN CALCIUM 2 % EX CREA: 1.0000 "application " | TOPICAL_CREAM | Freq: Every day | CUTANEOUS | Status: DC

## 2015-09-17 NOTE — Progress Notes (Signed)
Patient ID: Anthony Logan, male   DOB: 22-May-1960, 55 y.o.   MRN: [id number] Subjective: Anthony Logan is a 55 y.o. male patient seen in office for evaluation of callus at left big toe that he states sometimes bleed and is tender. Admits neuropathy on left side secondary to sciatica. Denies nausea/fever/vomiting/chills/night sweats/shortness of breath/pain. Patient has no other pedal complaints at this time.  Admits that he is getting over abdominal incision site infection and is currently on antibiotics.   Patient Active Problem List   Diagnosis Date Noted  . Healthcare maintenance 09/11/2015  . Renal mass 08/27/2015  . Right renal mass   . Edema 07/03/2015  . Abnormal MRI, lumbar spine 06/16/2015  . Radiculopathy of lumbar region 06/14/2015  . Mass of right kidney 06/13/2015  . Goiter 06/13/2015  . Proteinuria 03/27/2015  . Hyperlipidemia 03/27/2015  . Morbid obesity ([place]) 03/27/2015  . Mild dilation of ascending aorta (HCC) 06/01/2013  . Essential hypertension, benign 02/15/2013  . Precordial pain 02/15/2013  . Abnormal ECG 02/15/2013  . Mixed hyperlipidemia 02/15/2013   Current Outpatient Prescriptions on File Prior to Visit  Medication Sig Dispense Refill  . furosemide (LASIX) 20 MG tablet Take 5m in the morning and 273min the afternoon (Patient taking differently: Take 20-40 mg by mouth 2 (two) times daily. Take 4042mn the morning and 71m34m the afternoon) 90 tablet 2  . hydrochlorothiazide (HYDRODIURIL) 25 MG tablet Take 1 tablet (25 mg total) by mouth daily. 30 tablet 2  . lisinopril (PRINIVIL,ZESTRIL) 40 MG tablet Take 1 tablet (40 mg total) by mouth daily. 30 tablet 2  . metoprolol (LOPRESSOR) 100 MG tablet Take 2 tablets (200 mg total) by mouth 2 (two) times daily. 120 tablet 2  . Misc. Devices (HUGO ROLLING WALKER PREMIUM) MISC 1 Units by Does not apply route once. 1 each 0  . traMADol (ULTRAM) 50 MG tablet Take 1 tablet (50 mg total) by mouth every 8 (eight) hours as  needed. 60 tablet 0   No current facility-administered medications on file prior to visit.   Allergies  Allergen Reactions  . Diltiazem Swelling    Wt gain,swelling hands,feet,gum bleeding  . Clonidine Derivatives Swelling  . Procardia [Nifedipine] Swelling    Swelling on feet and legs     Recent Results (from the past 2160 hour(s))  Renal Function Panel     Status: Abnormal   Collection Time: 07/09/15  7:08 AM  Result Value Ref Range   Sodium 139 135 - 146 mmol/L   Potassium 4.0 3.5 - 5.3 mmol/L   Chloride 99 98 - 110 mmol/L   CO2 33 (H) 20 - 31 mmol/L   Glucose, Bld 134 (H) 65 - 99 mg/dL   BUN 12 7 - 25 mg/dL   Creat 0.73 0.70 - 1.33 mg/dL   Albumin 4.2 3.6 - 5.1 g/dL   Calcium 9.2 8.6 - 10.3 mg/dL   Phosphorus 3.1 2.5 - 4.5 mg/dL  Protein / Creatinine Ratio, Urine     Status: Abnormal   Collection Time: 07/09/15  1:04 PM  Result Value Ref Range   Creatinine, Urine 210 20 - 370 mg/dL   Total Protein, Urine 120 (H) 5 - 25 mg/dL   Protein Creatinine Ratio 571 (H) 22 - 128 mg/g creat  PTH, Intact and Calcium     Status: None   Collection Time: 07/09/15  1:04 PM  Result Value Ref Range   PTH 36 14 - 64 pg/mL   Calcium  9.0 8.4 - 10.5 mg/dL    Comment:   Interpretive Guide:                              Intact PTH               Calcium                              ----------               ------- Normal Parathyroid           Normal                   Normal Hypoparathyroidism           Low or Low Normal        Low Hyperparathyroidism      Primary                 Normal or High           High      Secondary               High                     Normal or Low      Tertiary                High                     High Non-Parathyroid   Hypercalcemia              Low or Low Normal        High   Vitamin D (25 hydroxy)     Status: None   Collection Time: 07/09/15  1:04 PM  Result Value Ref Range   Vit D, 25-Hydroxy 32 30 - 100 ng/mL    Comment: Vitamin D Status            25-OH Vitamin D        Deficiency                <20 ng/mL        Insufficiency         20 - 29 ng/mL        Optimal             > or = 30 ng/mL   For 25-OH Vitamin D testing on patients on D2-supplementation and patients for whom quantitation of D2 and D3 fractions is required, the QuestAssureD 25-OH VIT D, (D2,D3), LC/MS/MS is recommended: order code [phone] [id number] (patients > 2 yrs).   Hematocrit     Status: None   Collection Time: 07/09/15  1:04 PM  Result Value Ref Range   HCT 47.8 39.0 - 52.0 %  Hemoglobin     Status: None   Collection Time: 07/09/15  1:04 PM  Result Value Ref Range   Hemoglobin 16.0 13.0 - 17.0 g/dL  APTT upon arrival     Status: None   Collection Time: 07/12/15 11:15 AM  Result Value Ref Range   aPTT 31 24 - 37 seconds  CBC upon arrival     Status: None   Collection Time: 07/12/15 11:15 AM  Result Value Ref Range   WBC 9.7 4.0 - 10.5 K/uL   RBC 5.21  4.22 - 5.81 MIL/uL   Hemoglobin 16.5 13.0 - 17.0 g/dL   HCT 48.4 39.0 - 52.0 %   MCV 92.9 78.0 - 100.0 fL   MCH 31.7 26.0 - 34.0 pg   MCHC 34.1 30.0 - 36.0 g/dL   RDW 12.9 11.5 - 15.5 %   Platelets 297 150 - 400 K/uL  Protime-INR upon arrival     Status: None   Collection Time: 07/12/15 11:15 AM  Result Value Ref Range   Prothrombin Time 13.9 11.6 - 15.2 seconds   INR 1.09 0.00 - 1.49  TSH     Status: None   Collection Time: 08/09/15 10:01 AM  Result Value Ref Range   TSH 0.752 0.450 - 4.500 uIU/mL  Basic metabolic panel     Status: Abnormal   Collection Time: 08/23/15 10:00 AM  Result Value Ref Range   Sodium 143 135 - 145 mmol/L   Potassium 3.8 3.5 - 5.1 mmol/L   Chloride 99 (L) 101 - 111 mmol/L   CO2 32 22 - 32 mmol/L   Glucose, Bld 142 (H) 65 - 99 mg/dL   BUN 17 6 - 20 mg/dL   Creatinine, Ser 0.85 0.61 - 1.24 mg/dL   Calcium 9.9 8.9 - 10.3 mg/dL   GFR calc non Af Amer >60 >60 mL/min   GFR calc Af Amer >60 >60 mL/min    Comment: (NOTE) The eGFR has been calculated using the CKD EPI  equation. This calculation has not been validated in all clinical situations. eGFR's persistently <60 mL/min signify possible Chronic Kidney Disease.    Anion gap 12 5 - 15  CBC     Status: None   Collection Time: 08/23/15 10:00 AM  Result Value Ref Range   WBC 10.2 4.0 - 10.5 K/uL   RBC 5.15 4.22 - 5.81 MIL/uL   Hemoglobin 16.2 13.0 - 17.0 g/dL   HCT 46.0 39.0 - 52.0 %   MCV 89.3 78.0 - 100.0 fL   MCH 31.5 26.0 - 34.0 pg   MCHC 35.2 30.0 - 36.0 g/dL   RDW 12.6 11.5 - 15.5 %   Platelets 329 150 - 400 K/uL  Type and screen [place]     Status: None   Collection Time: 08/23/15 10:00 AM  Result Value Ref Range   ABO/RH(D) O POS    Antibody Screen NEG    Sample Expiration 08/30/2015    Extend sample reason NO TRANSFUSIONS OR PREGNANCY IN THE PAST 3 MONTHS   ABO/Rh     Status: None   Collection Time: 08/23/15 10:00 AM  Result Value Ref Range   ABO/RH(D) O POS   CBC     Status: Abnormal   Collection Time: 08/27/15 12:03 PM  Result Value Ref Range   WBC 16.4 (H) 4.0 - 10.5 K/uL   RBC 4.86 4.22 - 5.81 MIL/uL   Hemoglobin 15.2 13.0 - 17.0 g/dL   HCT 44.6 39.0 - 52.0 %   MCV 91.8 78.0 - 100.0 fL   MCH 31.3 26.0 - 34.0 pg   MCHC 34.1 30.0 - 36.0 g/dL   RDW 13.0 11.5 - 15.5 %   Platelets 277 150 - 400 K/uL  Basic metabolic panel     Status: Abnormal   Collection Time: 08/27/15 12:03 PM  Result Value Ref Range   Sodium 138 135 - 145 mmol/L   Potassium 4.3 3.5 - 5.1 mmol/L   Chloride 102 101 - 111 mmol/L   CO2 25 22 -  32 mmol/L   Glucose, Bld 173 (H) 65 - 99 mg/dL   BUN 18 6 - 20 mg/dL   Creatinine, Ser 1.10 0.61 - 1.24 mg/dL   Calcium 8.5 (L) 8.9 - 10.3 mg/dL   GFR calc non Af Amer >60 >60 mL/min   GFR calc Af Amer >60 >60 mL/min    Comment: (NOTE) The eGFR has been calculated using the CKD EPI equation. This calculation has not been validated in all clinical situations. eGFR's persistently <60 mL/min signify possible Chronic Kidney Disease.     Anion gap 11 5 - 15  CBC     Status: Abnormal   Collection Time: 08/28/15  5:18 AM  Result Value Ref Range   WBC 12.6 (H) 4.0 - 10.5 K/uL   RBC 4.28 4.22 - 5.81 MIL/uL   Hemoglobin 13.3 13.0 - 17.0 g/dL   HCT 38.6 (L) 39.0 - 52.0 %   MCV 90.2 78.0 - 100.0 fL   MCH 31.1 26.0 - 34.0 pg   MCHC 34.5 30.0 - 36.0 g/dL   RDW 13.0 11.5 - 15.5 %   Platelets 224 150 - 400 K/uL  Basic metabolic panel     Status: Abnormal   Collection Time: 08/28/15  5:18 AM  Result Value Ref Range   Sodium 136 135 - 145 mmol/L   Potassium 3.8 3.5 - 5.1 mmol/L   Chloride 99 (L) 101 - 111 mmol/L   CO2 29 22 - 32 mmol/L   Glucose, Bld 123 (H) 65 - 99 mg/dL   BUN 20 6 - 20 mg/dL   Creatinine, Ser 1.28 (H) 0.61 - 1.24 mg/dL   Calcium 8.2 (L) 8.9 - 10.3 mg/dL   GFR calc non Af Amer >60 >60 mL/min   GFR calc Af Amer >60 >60 mL/min    Comment: (NOTE) The eGFR has been calculated using the CKD EPI equation. This calculation has not been validated in all clinical situations. eGFR's persistently <60 mL/min signify possible Chronic Kidney Disease.    Anion gap 8 5 - 15    Objective: There were no vitals filed for this visit.  General: Patient is awake, alert, oriented x 3 and in no acute distress.  Dermatology: Skin is warm and dry bilateral with a partial thickness ulceration present  Medial left hallux. Ulceration measures 0.5 cm x 0.5 cm x 0.3 cm. There is a keratotic border with a granular base. The ulceration does not probe to bone. There is no malodor, no active drainage, no erythema, no edema. No acute signs of infection.   Vascular: Dorsalis Pedis pulse = 2/4 Bilateral,  Posterior Tibial pulse = 1/4 Bilateral,  Capillary Fill Time < 5 seconds  Neurologic: Protective sensation intact on right and diminished on left.  Musculosketal: Minimal Pain with palpation to ulcerated area. No pain with compression to calves bilateral. Pes planus deformities noted bilateral.  Assessment and Plan:  Problem List  Items Addressed This Visit    None    Visit Diagnoses    Toe ulcer, left, limited to breakdown of skin (HCC)    -  Primary    Relevant Medications    mupirocin cream (BACTROBAN) 2 %    Toe pain, left        Relevant Medications    mupirocin cream (BACTROBAN) 2 %    Pes planus of both feet        Sciatica neuralgia, left          -Examined patient and discussed the progression  of the wound and treatment alternatives. - Excisionally debrided ulceration to healthy bleeding borders using a sterile chisel blade. -Applied topical antibiotic cream and dry sterile dressing and instructed patient to continue with daily dressings at home consisting of bactroban which was rx and bandaid dressing and to use offloading foam given to him at todays appt - Advised patient to go to the ER or return to office if the wound worsens or if constitutional symptoms are present. -Patient to return to office in 2 weeks for follow up care and evaluation or sooner if problems arise. -Once ulceration heals will consider orthotics or braces from [facility].   Landis Martins, DPM

## 2015-09-18 ENCOUNTER — Telehealth: Payer: Self-pay | Admitting: *Deleted

## 2015-09-18 MED ORDER — MUPIROCIN 2 % EX OINT: TOPICAL_OINTMENT | CUTANEOUS | Status: DC

## 2015-09-18 NOTE — Telephone Encounter (Addendum)
[facility] states pt's insurance will not cover Bactroban cream but will cover Bactroban ointment.  Dr. Cannon Kettle changed to Bactroban ointment. Escribed to [employer].

## 2015-09-26 ENCOUNTER — Other Ambulatory Visit: Payer: Self-pay | Admitting: Urology

## 2015-09-26 ENCOUNTER — Ambulatory Visit (HOSPITAL_COMMUNITY)
Admission: RE | Admit: 2015-09-26 | Discharge: 2015-09-26 | Disposition: A | Discharge status: Home / Self Care | Payer: Medicaid Other | Source: Ambulatory Visit | Attending: Urology | Admitting: Urology

## 2015-09-26 ENCOUNTER — Ambulatory Visit (INDEPENDENT_AMBULATORY_CARE_PROVIDER_SITE_OTHER): Payer: Medicaid Other | Admitting: Urology

## 2015-09-26 DIAGNOSIS — N451 Epididymitis: Secondary | ICD-10-CM

## 2015-10-01 ENCOUNTER — Ambulatory Visit (INDEPENDENT_AMBULATORY_CARE_PROVIDER_SITE_OTHER): Payer: Medicaid Other | Admitting: Sports Medicine

## 2015-10-01 ENCOUNTER — Encounter: Payer: Self-pay | Admitting: Sports Medicine

## 2015-10-01 DIAGNOSIS — M79675 Pain in left toe(s): Secondary | ICD-10-CM

## 2015-10-01 DIAGNOSIS — M2142 Flat foot [pes planus] (acquired), left foot: Secondary | ICD-10-CM

## 2015-10-01 DIAGNOSIS — M5432 Sciatica, left side: Secondary | ICD-10-CM

## 2015-10-01 DIAGNOSIS — L97521 Non-pressure chronic ulcer of other part of left foot limited to breakdown of skin: Secondary | ICD-10-CM

## 2015-10-01 DIAGNOSIS — M2141 Flat foot [pes planus] (acquired), right foot: Secondary | ICD-10-CM

## 2015-10-01 DIAGNOSIS — M21372 Foot drop, left foot: Secondary | ICD-10-CM

## 2015-10-01 DIAGNOSIS — L97501 Non-pressure chronic ulcer of other part of unspecified foot limited to breakdown of skin: Secondary | ICD-10-CM | POA: Diagnosis not present

## 2015-10-01 NOTE — Progress Notes (Signed)
Patient ID: ARVIS ZWAHLEN, male   DOB: 06-06-1960, 55 y.o.   MRN: [id number]  Subjective: GARO HEIDELBERG is a 55 y.o. male patient seen in office for follow up evaluation of ulceration at left big toe, reports that he has been dressing it daily with bactroban and that sometime it still bleeds and is tender. Admits neuropathy on left side secondary to sciatica and foot drop. Denies nausea/fever/vomiting/chills/night sweats/shortness of breath/pain. Patient has no other pedal complaints at this time.  Admits he is currently on antibiotics for testicular infection.   Patient Active Problem List   Diagnosis Date Noted  . Healthcare maintenance 09/11/2015  . Renal mass 08/27/2015  . Right renal mass   . Edema 07/03/2015  . Abnormal MRI, lumbar spine 06/16/2015  . Radiculopathy of lumbar region 06/14/2015  . Mass of right kidney 06/13/2015  . Goiter 06/13/2015  . Proteinuria 03/27/2015  . Hyperlipidemia 03/27/2015  . Morbid obesity ([place]) 03/27/2015  . Mild dilation of ascending aorta (HCC) 06/01/2013  . Essential hypertension, benign 02/15/2013  . Precordial pain 02/15/2013  . Abnormal ECG 02/15/2013  . Mixed hyperlipidemia 02/15/2013   Current Outpatient Prescriptions on File Prior to Visit  Medication Sig Dispense Refill  . furosemide (LASIX) 20 MG tablet Take 44m in the morning and 242min the afternoon (Patient taking differently: Take 20-40 mg by mouth 2 (two) times daily. Take 4017mn the morning and 17m89m the afternoon) 90 tablet 2  . hydrochlorothiazide (HYDRODIURIL) 25 MG tablet Take 1 tablet (25 mg total) by mouth daily. 30 tablet 2  . lisinopril (PRINIVIL,ZESTRIL) 40 MG tablet Take 1 tablet (40 mg total) by mouth daily. 30 tablet 2  . metoprolol (LOPRESSOR) 100 MG tablet Take 2 tablets (200 mg total) by mouth 2 (two) times daily. 120 tablet 2  . Misc. Devices (HUGO ROLLING WALKER PREMIUM) MISC 1 Units by Does not apply route once. 1 each 0  . mupirocin ointment (BACTROBAN) 2 %  Apply to affected area daily. 30 g 0  . traMADol (ULTRAM) 50 MG tablet Take 1 tablet (50 mg total) by mouth every 8 (eight) hours as needed. 60 tablet 0   No current facility-administered medications on file prior to visit.   Allergies  Allergen Reactions  . Diltiazem Swelling    Wt gain,swelling hands,feet,gum bleeding  . Clonidine Derivatives Swelling  . Procardia [Nifedipine] Swelling    Swelling on feet and legs     Recent Results (from the past 2160 hour(s))  Renal Function Panel     Status: Abnormal   Collection Time: 07/09/15  7:08 AM  Result Value Ref Range   Sodium 139 135 - 146 mmol/L   Potassium 4.0 3.5 - 5.3 mmol/L   Chloride 99 98 - 110 mmol/L   CO2 33 (H) 20 - 31 mmol/L   Glucose, Bld 134 (H) 65 - 99 mg/dL   BUN 12 7 - 25 mg/dL   Creat 0.73 0.70 - 1.33 mg/dL   Albumin 4.2 3.6 - 5.1 g/dL   Calcium 9.2 8.6 - 10.3 mg/dL   Phosphorus 3.1 2.5 - 4.5 mg/dL  Protein / Creatinine Ratio, Urine     Status: Abnormal   Collection Time: 07/09/15  1:04 PM  Result Value Ref Range   Creatinine, Urine 210 20 - 370 mg/dL   Total Protein, Urine 120 (H) 5 - 25 mg/dL   Protein Creatinine Ratio 571 (H) 22 - 128 mg/g creat  PTH, Intact and Calcium  Status: None   Collection Time: 07/09/15  1:04 PM  Result Value Ref Range   PTH 36 14 - 64 pg/mL   Calcium 9.0 8.4 - 10.5 mg/dL    Comment:   Interpretive Guide:                              Intact PTH               Calcium                              ----------               ------- Normal Parathyroid           Normal                   Normal Hypoparathyroidism           Low or Low Normal        Low Hyperparathyroidism      Primary                 Normal or High           High      Secondary               High                     Normal or Low      Tertiary                High                     High Non-Parathyroid   Hypercalcemia              Low or Low Normal        High   Vitamin D (25 hydroxy)     Status: None    Collection Time: 07/09/15  1:04 PM  Result Value Ref Range   Vit D, 25-Hydroxy 32 30 - 100 ng/mL    Comment: Vitamin D Status           25-OH Vitamin D        Deficiency                <20 ng/mL        Insufficiency         20 - 29 ng/mL        Optimal             > or = 30 ng/mL   For 25-OH Vitamin D testing on patients on D2-supplementation and patients for whom quantitation of D2 and D3 fractions is required, the QuestAssureD 25-OH VIT D, (D2,D3), LC/MS/MS is recommended: order code [phone] (patients > 2 yrs).   Hematocrit     Status: None   Collection Time: 07/09/15  1:04 PM  Result Value Ref Range   HCT 47.8 39.0 - 52.0 %  Hemoglobin     Status: None   Collection Time: 07/09/15  1:04 PM  Result Value Ref Range   Hemoglobin 16.0 13.0 - 17.0 g/dL  APTT upon arrival     Status: None   Collection Time: 07/12/15 11:15 AM  Result Value Ref Range   aPTT 31 24 - 37 seconds  CBC upon arrival  Status: None   Collection Time: 07/12/15 11:15 AM  Result Value Ref Range   WBC 9.7 4.0 - 10.5 K/uL   RBC 5.21 4.22 - 5.81 MIL/uL   Hemoglobin 16.5 13.0 - 17.0 g/dL   HCT 48.4 39.0 - 52.0 %   MCV 92.9 78.0 - 100.0 fL   MCH 31.7 26.0 - 34.0 pg   MCHC 34.1 30.0 - 36.0 g/dL   RDW 12.9 11.5 - 15.5 %   Platelets 297 150 - 400 K/uL  Protime-INR upon arrival     Status: None   Collection Time: 07/12/15 11:15 AM  Result Value Ref Range   Prothrombin Time 13.9 11.6 - 15.2 seconds   INR 1.09 0.00 - 1.49  TSH     Status: None   Collection Time: 08/09/15 10:01 AM  Result Value Ref Range   TSH 0.752 0.450 - 4.500 uIU/mL  Basic metabolic panel     Status: Abnormal   Collection Time: 08/23/15 10:00 AM  Result Value Ref Range   Sodium 143 135 - 145 mmol/L   Potassium 3.8 3.5 - 5.1 mmol/L   Chloride 99 (L) 101 - 111 mmol/L   CO2 32 22 - 32 mmol/L   Glucose, Bld 142 (H) 65 - 99 mg/dL   BUN 17 6 - 20 mg/dL   Creatinine, Ser 0.85 0.61 - 1.24 mg/dL   Calcium 9.9 8.9 - 10.3 mg/dL   GFR calc non  Af Amer >60 >60 mL/min   GFR calc Af Amer >60 >60 mL/min    Comment: (NOTE) The eGFR has been calculated using the CKD EPI equation. This calculation has not been validated in all clinical situations. eGFR's persistently <60 mL/min signify possible Chronic Kidney Disease.    Anion gap 12 5 - 15  CBC     Status: None   Collection Time: 08/23/15 10:00 AM  Result Value Ref Range   WBC 10.2 4.0 - 10.5 K/uL   RBC 5.15 4.22 - 5.81 MIL/uL   Hemoglobin 16.2 13.0 - 17.0 g/dL   HCT 46.0 39.0 - 52.0 %   MCV 89.3 78.0 - 100.0 fL   MCH 31.5 26.0 - 34.0 pg   MCHC 35.2 30.0 - 36.0 g/dL   RDW 12.6 11.5 - 15.5 %   Platelets 329 150 - 400 K/uL  Type and screen [place]     Status: None   Collection Time: 08/23/15 10:00 AM  Result Value Ref Range   ABO/RH(D) O POS    Antibody Screen NEG    Sample Expiration 08/30/2015    Extend sample reason NO TRANSFUSIONS OR PREGNANCY IN THE PAST 3 MONTHS   ABO/Rh     Status: None   Collection Time: 08/23/15 10:00 AM  Result Value Ref Range   ABO/RH(D) O POS   CBC     Status: Abnormal   Collection Time: 08/27/15 12:03 PM  Result Value Ref Range   WBC 16.4 (H) 4.0 - 10.5 K/uL   RBC 4.86 4.22 - 5.81 MIL/uL   Hemoglobin 15.2 13.0 - 17.0 g/dL   HCT 44.6 39.0 - 52.0 %   MCV 91.8 78.0 - 100.0 fL   MCH 31.3 26.0 - 34.0 pg   MCHC 34.1 30.0 - 36.0 g/dL   RDW 13.0 11.5 - 15.5 %   Platelets 277 150 - 400 K/uL  Basic metabolic panel     Status: Abnormal   Collection Time: 08/27/15 12:03 PM  Result Value Ref Range   Sodium  138 135 - 145 mmol/L   Potassium 4.3 3.5 - 5.1 mmol/L   Chloride 102 101 - 111 mmol/L   CO2 25 22 - 32 mmol/L   Glucose, Bld 173 (H) 65 - 99 mg/dL   BUN 18 6 - 20 mg/dL   Creatinine, Ser 1.10 0.61 - 1.24 mg/dL   Calcium 8.5 (L) 8.9 - 10.3 mg/dL   GFR calc non Af Amer >60 >60 mL/min   GFR calc Af Amer >60 >60 mL/min    Comment: (NOTE) The eGFR has been calculated using the CKD EPI equation. This calculation has not  been validated in all clinical situations. eGFR's persistently <60 mL/min signify possible Chronic Kidney Disease.    Anion gap 11 5 - 15  CBC     Status: Abnormal   Collection Time: 08/28/15  5:18 AM  Result Value Ref Range   WBC 12.6 (H) 4.0 - 10.5 K/uL   RBC 4.28 4.22 - 5.81 MIL/uL   Hemoglobin 13.3 13.0 - 17.0 g/dL   HCT 38.6 (L) 39.0 - 52.0 %   MCV 90.2 78.0 - 100.0 fL   MCH 31.1 26.0 - 34.0 pg   MCHC 34.5 30.0 - 36.0 g/dL   RDW 13.0 11.5 - 15.5 %   Platelets 224 150 - 400 K/uL  Basic metabolic panel     Status: Abnormal   Collection Time: 08/28/15  5:18 AM  Result Value Ref Range   Sodium 136 135 - 145 mmol/L   Potassium 3.8 3.5 - 5.1 mmol/L   Chloride 99 (L) 101 - 111 mmol/L   CO2 29 22 - 32 mmol/L   Glucose, Bld 123 (H) 65 - 99 mg/dL   BUN 20 6 - 20 mg/dL   Creatinine, Ser 1.28 (H) 0.61 - 1.24 mg/dL   Calcium 8.2 (L) 8.9 - 10.3 mg/dL   GFR calc non Af Amer >60 >60 mL/min   GFR calc Af Amer >60 >60 mL/min    Comment: (NOTE) The eGFR has been calculated using the CKD EPI equation. This calculation has not been validated in all clinical situations. eGFR's persistently <60 mL/min signify possible Chronic Kidney Disease.    Anion gap 8 5 - 15    Objective: There were no vitals filed for this visit.  General: Patient is awake, alert, oriented x 3 and in no acute distress.  Dermatology: Skin is warm and dry bilateral with a partial thickness ulceration present  Medial left hallux. Ulceration measures 0.2x0.2x0.2cm (last measurement 0.5 cm x 0.5 cm x 0.3 cm). There is a keratotic border with a granular base. The ulceration does not probe to bone. There is no malodor, no active drainage, no erythema, no edema. No acute signs of infection.   Vascular: Dorsalis Pedis pulse = 2/4 Bilateral,  Posterior Tibial pulse = 1/4 Bilateral,  Capillary Fill Time < 5 seconds  Neurologic: Protective sensation intact on right and diminished on left.  Musculosketal: Minimal Pain with  palpation to ulcerated area. No pain with compression to calves bilateral. Pes planus deformities noted bilateral.  Assessment and Plan:  Problem List Items Addressed This Visit    None    Visit Diagnoses    Toe ulcer, left, limited to breakdown of skin (HCC)    -  Primary    Toe pain, left        Pes planus of both feet        Sciatica neuralgia, left        Foot drop, left          -  Examined patient and discussed the progression of the wound and treatment alternatives. - Excisionally debrided ulceration to healthy bleeding borders using a sterile chisel blade. -Applied topical antibiotic cream and dry sterile dressing and instructed patient to continue with daily dressings at home consisting of bactroban and bandaid dressing and to use offloading silicone toe pad given to him at todays appt - Advised patient to go to the ER or return to office if the wound worsens or if constitutional symptoms are present. -Patient to return to office in 3-4 weeks for follow up care and evaluation or sooner if problems arise. -Once ulceration heals will consider orthotics or braces from [facility].   Landis Martins, DPM

## 2015-10-10 ENCOUNTER — Encounter: Payer: Self-pay | Admitting: Family Medicine

## 2015-10-10 ENCOUNTER — Ambulatory Visit (INDEPENDENT_AMBULATORY_CARE_PROVIDER_SITE_OTHER): Payer: Medicaid Other | Admitting: Family Medicine

## 2015-10-10 VITALS — BP 153/84 | HR 51 | Temp 97.5°F | Ht 77.0 in | Wt 354.6 lb

## 2015-10-10 DIAGNOSIS — Z0000 Encounter for general adult medical examination without abnormal findings: Secondary | ICD-10-CM

## 2015-10-10 DIAGNOSIS — I10 Essential (primary) hypertension: Secondary | ICD-10-CM | POA: Diagnosis not present

## 2015-10-10 DIAGNOSIS — T8189XA Other complications of procedures, not elsewhere classified, initial encounter: Secondary | ICD-10-CM | POA: Insufficient documentation

## 2015-10-10 DIAGNOSIS — R600 Localized edema: Secondary | ICD-10-CM | POA: Diagnosis not present

## 2015-10-10 MED ORDER — HYDRALAZINE HCL 10 MG PO TABS: 10.0000 mg | ORAL_TABLET | Freq: Three times a day (TID) | ORAL | Status: DC

## 2015-10-10 NOTE — Progress Notes (Signed)
   HPI  Patient presents today here to follow-up for hypertension.  Hypertension Checking blood pressure at home, 150s to 160s over 80s most of the time No headache, chest pain, dyspnea, palpitations. Leg edema is stable.  Has stopped lisinopril recently per his urologist recommendations. Has continued Lasix and HCTZ as well as beta blocker. Has follow-up with nephrology in the next few weeks.  Surgical wound Feels that it's not healing fast enough, states that he has been placing Neosporin on it also he has been placing wet-to-dry dressings 3-4 times daily per an RN that he knows.  Leg swelling Persistent, however not very severe currently.  PMH: Smoking status noted ROS: Per HPI  Objective: BP 153/84 mmHg  Pulse 51  Temp(Src) 97.5 F (36.4 C) (Oral)  Ht 6\' 5"  (1.956 m)  Wt 354 lb 9.6 oz (160.846 kg)  BMI 42.04 kg/m2 Gen: NAD, alert, cooperative with exam HEENT: NCAT CV: RRR, good S1/S2, no murmur Resp: CTABL, no wheezes, non-labored Abd: Right lower quadrant surgical wound measuring 2.9 cm x 1.5 cm, some yellow tissue on the inside with a 10 rim of erythema, approximately 1-1/2 cm deep, no purulent drainage, scant amount of sanguinous drainage,  no induration, warmth, Ext: 1+ pitting edema bilateral lower extremities Neuro: Alert and oriented, No gross deficits  Assessment and plan:  # Hypertension Not at goal, and added hydralazine He is on a beta blocker status and to diuretics ACE inhibitor stopped by urology, he is now down to one kidney so I understand protecting his kidney. I asked him to discuss this with nephrology Calcium channel blockers have caused worsening swelling in the past Follow-up in 6 weeks with me, has nephrology in the next few weeks  # Surgical wound No signs of infection Continued Neosporin and wet-to-dry dressings  # Leg swelling Stable Avoiding amlodipine due to already moderate leg swelling  # Healthcare maintenance Given FOBT  card, would like to wait on colonoscopy     No orders of the defined types were placed in this encounter.    Meds ordered this encounter  Medications  . hydrALAZINE (APRESOLINE) 10 MG tablet    Sig: Take 1 tablet (10 mg total) by mouth 3 (three) times daily.    Dispense:  90 tablet    Refill:  [place], MD [facility] 10/10/2015, 9:17 AM

## 2015-10-10 NOTE — Patient Instructions (Signed)
Great to see you!  I have started hydralazine 10 mg 3 times a day  Lets plan to see you again in 4-6 weeks for follow up blood pressure

## 2015-10-12 ENCOUNTER — Other Ambulatory Visit: Payer: Medicaid Other

## 2015-10-12 DIAGNOSIS — Z1211 Encounter for screening for malignant neoplasm of colon: Secondary | ICD-10-CM

## 2015-10-15 LAB — FECAL OCCULT BLOOD, IMMUNOCHEMICAL: FECAL OCCULT BLD: POSITIVE — AB

## 2015-10-16 ENCOUNTER — Other Ambulatory Visit: Payer: Self-pay | Admitting: *Deleted

## 2015-10-16 DIAGNOSIS — K921 Melena: Secondary | ICD-10-CM

## 2015-10-17 ENCOUNTER — Other Ambulatory Visit: Payer: Self-pay | Admitting: Urology

## 2015-10-17 ENCOUNTER — Ambulatory Visit (INDEPENDENT_AMBULATORY_CARE_PROVIDER_SITE_OTHER): Payer: Medicaid Other | Admitting: Urology

## 2015-10-17 DIAGNOSIS — C641 Malignant neoplasm of right kidney, except renal pelvis: Secondary | ICD-10-CM

## 2015-10-17 DIAGNOSIS — N451 Epididymitis: Secondary | ICD-10-CM | POA: Diagnosis not present

## 2015-10-19 ENCOUNTER — Telehealth: Payer: Self-pay | Admitting: *Deleted

## 2015-10-19 NOTE — Telephone Encounter (Signed)
I think we are OK to proceed as scheduled. Thanks for checking

## 2015-10-19 NOTE — Telephone Encounter (Signed)
Dr Loletha Carrow, This patient is scheduled with you for a direct colon screening 6-21, Wednesday .  On 08-27-2015, he had a robotic assisted laparoscopic right radical nephrectomy for a renal mass.  I just want to be sure he is okay for his colon 6-21. Please advise Thanks for your time, Marijean Niemann

## 2015-10-29 ENCOUNTER — Encounter: Payer: Self-pay | Admitting: Sports Medicine

## 2015-10-29 ENCOUNTER — Ambulatory Visit (INDEPENDENT_AMBULATORY_CARE_PROVIDER_SITE_OTHER): Payer: Medicaid Other | Admitting: Sports Medicine

## 2015-10-29 DIAGNOSIS — L97521 Non-pressure chronic ulcer of other part of left foot limited to breakdown of skin: Secondary | ICD-10-CM | POA: Diagnosis not present

## 2015-10-29 DIAGNOSIS — M5432 Sciatica, left side: Secondary | ICD-10-CM

## 2015-10-29 DIAGNOSIS — M2142 Flat foot [pes planus] (acquired), left foot: Secondary | ICD-10-CM

## 2015-10-29 DIAGNOSIS — M2141 Flat foot [pes planus] (acquired), right foot: Secondary | ICD-10-CM | POA: Diagnosis not present

## 2015-10-29 DIAGNOSIS — M21372 Foot drop, left foot: Secondary | ICD-10-CM

## 2015-10-29 DIAGNOSIS — M79675 Pain in left toe(s): Secondary | ICD-10-CM

## 2015-10-29 NOTE — Progress Notes (Signed)
Patient ID: Anthony Logan, male   DOB: November 01, 1960, 55 y.o.   MRN: [id number]  Subjective: Anthony Logan is a 55 y.o. male patient seen in office for follow up evaluation of ulceration at left big toe, reports that he has stopped dressing it because no longer tender or bleeds. Denies nausea/fever/vomiting/chills/night sweats/shortness of breath/pain. Patient has no other pedal complaints at this time.  Patient Active Problem List   Diagnosis Date Noted  . Delayed surgical wound healing 10/10/2015  . Healthcare maintenance 09/11/2015  . Renal mass 08/27/2015  . Right renal mass   . Edema 07/03/2015  . Abnormal MRI, lumbar spine 06/16/2015  . Radiculopathy of lumbar region 06/14/2015  . Mass of right kidney 06/13/2015  . Goiter 06/13/2015  . Proteinuria 03/27/2015  . Hyperlipidemia 03/27/2015  . Morbid obesity ([place]) 03/27/2015  . Mild dilation of ascending aorta (HCC) 06/01/2013  . Essential hypertension, benign 02/15/2013  . Precordial pain 02/15/2013  . Abnormal ECG 02/15/2013  . Mixed hyperlipidemia 02/15/2013   Current Outpatient Prescriptions on File Prior to Visit  Medication Sig Dispense Refill  . furosemide (LASIX) 20 MG tablet Take 92m in the morning and 258min the afternoon (Patient taking differently: Take 20-40 mg by mouth 2 (two) times daily. Take 4029mn the morning and 24m62m the afternoon) 90 tablet 2  . hydrALAZINE (APRESOLINE) 10 MG tablet Take 1 tablet (10 mg total) by mouth 3 (three) times daily. 90 tablet 2  . hydrochlorothiazide (HYDRODIURIL) 25 MG tablet Take 1 tablet (25 mg total) by mouth daily. 30 tablet 2  . metoprolol (LOPRESSOR) 100 MG tablet Take 2 tablets (200 mg total) by mouth 2 (two) times daily. 120 tablet 2  . Misc. Devices (HUGO ROLLING WALKER PREMIUM) MISC 1 Units by Does not apply route once. 1 each 0  . mupirocin ointment (BACTROBAN) 2 % Apply to affected area daily. 30 g 0  . traMADol (ULTRAM) 50 MG tablet Take 1 tablet (50 mg total) by mouth  every 8 (eight) hours as needed. 60 tablet 0   No current facility-administered medications on file prior to visit.   Allergies  Allergen Reactions  . Diltiazem Swelling    Wt gain,swelling hands,feet,gum bleeding  . Clonidine Derivatives Swelling  . Procardia [Nifedipine] Swelling    Swelling on feet and legs     Recent Results (from the past 2160 hour(s))  TSH     Status: None   Collection Time: 08/09/15 10:01 AM  Result Value Ref Range   TSH 0.752 0.450 - 4.500 uIU/mL  Basic metabolic panel     Status: Abnormal   Collection Time: 08/23/15 10:00 AM  Result Value Ref Range   Sodium 143 135 - 145 mmol/L   Potassium 3.8 3.5 - 5.1 mmol/L   Chloride 99 (L) 101 - 111 mmol/L   CO2 32 22 - 32 mmol/L   Glucose, Bld 142 (H) 65 - 99 mg/dL   BUN 17 6 - 20 mg/dL   Creatinine, Ser 0.85 0.61 - 1.24 mg/dL   Calcium 9.9 8.9 - 10.3 mg/dL   GFR calc non Af Amer >60 >60 mL/min   GFR calc Af Amer >60 >60 mL/min    Comment: (NOTE) The eGFR has been calculated using the CKD EPI equation. This calculation has not been validated in all clinical situations. eGFR's persistently <60 mL/min signify possible Chronic Kidney Disease.    Anion gap 12 5 - 15  CBC     Status: None  Collection Time: 08/23/15 10:00 AM  Result Value Ref Range   WBC 10.2 4.0 - 10.5 K/uL   RBC 5.15 4.22 - 5.81 MIL/uL   Hemoglobin 16.2 13.0 - 17.0 g/dL   HCT 46.0 39.0 - 52.0 %   MCV 89.3 78.0 - 100.0 fL   MCH 31.5 26.0 - 34.0 pg   MCHC 35.2 30.0 - 36.0 g/dL   RDW 12.6 11.5 - 15.5 %   Platelets 329 150 - 400 K/uL  Type and screen [place]     Status: None   Collection Time: 08/23/15 10:00 AM  Result Value Ref Range   ABO/RH(D) O POS    Antibody Screen NEG    Sample Expiration 08/30/2015    Extend sample reason NO TRANSFUSIONS OR PREGNANCY IN THE PAST 3 MONTHS   ABO/Rh     Status: None   Collection Time: 08/23/15 10:00 AM  Result Value Ref Range   ABO/RH(D) O POS   CBC     Status: Abnormal    Collection Time: 08/27/15 12:03 PM  Result Value Ref Range   WBC 16.4 (H) 4.0 - 10.5 K/uL   RBC 4.86 4.22 - 5.81 MIL/uL   Hemoglobin 15.2 13.0 - 17.0 g/dL   HCT 44.6 39.0 - 52.0 %   MCV 91.8 78.0 - 100.0 fL   MCH 31.3 26.0 - 34.0 pg   MCHC 34.1 30.0 - 36.0 g/dL   RDW 13.0 11.5 - 15.5 %   Platelets 277 150 - 400 K/uL  Basic metabolic panel     Status: Abnormal   Collection Time: 08/27/15 12:03 PM  Result Value Ref Range   Sodium 138 135 - 145 mmol/L   Potassium 4.3 3.5 - 5.1 mmol/L   Chloride 102 101 - 111 mmol/L   CO2 25 22 - 32 mmol/L   Glucose, Bld 173 (H) 65 - 99 mg/dL   BUN 18 6 - 20 mg/dL   Creatinine, Ser 1.10 0.61 - 1.24 mg/dL   Calcium 8.5 (L) 8.9 - 10.3 mg/dL   GFR calc non Af Amer >60 >60 mL/min   GFR calc Af Amer >60 >60 mL/min    Comment: (NOTE) The eGFR has been calculated using the CKD EPI equation. This calculation has not been validated in all clinical situations. eGFR's persistently <60 mL/min signify possible Chronic Kidney Disease.    Anion gap 11 5 - 15  CBC     Status: Abnormal   Collection Time: 08/28/15  5:18 AM  Result Value Ref Range   WBC 12.6 (H) 4.0 - 10.5 K/uL   RBC 4.28 4.22 - 5.81 MIL/uL   Hemoglobin 13.3 13.0 - 17.0 g/dL   HCT 38.6 (L) 39.0 - 52.0 %   MCV 90.2 78.0 - 100.0 fL   MCH 31.1 26.0 - 34.0 pg   MCHC 34.5 30.0 - 36.0 g/dL   RDW 13.0 11.5 - 15.5 %   Platelets 224 150 - 400 K/uL  Basic metabolic panel     Status: Abnormal   Collection Time: 08/28/15  5:18 AM  Result Value Ref Range   Sodium 136 135 - 145 mmol/L   Potassium 3.8 3.5 - 5.1 mmol/L   Chloride 99 (L) 101 - 111 mmol/L   CO2 29 22 - 32 mmol/L   Glucose, Bld 123 (H) 65 - 99 mg/dL   BUN 20 6 - 20 mg/dL   Creatinine, Ser 1.28 (H) 0.61 - 1.24 mg/dL   Calcium 8.2 (L) 8.9 - 10.3  mg/dL   GFR calc non Af Amer >60 >60 mL/min   GFR calc Af Amer >60 >60 mL/min    Comment: (NOTE) The eGFR has been calculated using the CKD EPI equation. This calculation has not been  validated in all clinical situations. eGFR's persistently <60 mL/min signify possible Chronic Kidney Disease.    Anion gap 8 5 - 15  Fecal occult blood, imunochemical     Status: Abnormal   Collection Time: 10/12/15  4:11 PM  Result Value Ref Range   Fecal Occult Bld Positive (A) Negative    Objective: There were no vitals filed for this visit.  General: Patient is awake, alert, oriented x 3 and in no acute distress.  Dermatology: Skin is warm and dry bilateral with a prematurely healed ulceration at the Medial aspect of the left hallux with mild reactive keratosis. There is no underlying opening, no malodor, no active drainage, no erythema, no edema. No acute signs of infection.   Vascular: Dorsalis Pedis pulse = 2/4 Bilateral,  Posterior Tibial pulse = 1/4 Bilateral,  Capillary Fill Time < 5 seconds  Neurologic: Protective sensation intact on right and diminished on left.  Musculosketal: No pain to palpation to previously ulcerated area at left medial hallux. No pain with compression to calves bilateral. Pes planus deformities noted bilateral. Left drop foot hx of sciatica and nerve damage.   Assessment and Plan:  Problem List Items Addressed This Visit    None    Visit Diagnoses    Toe ulcer, left, limited to breakdown of skin ([place])    -  Primary    Prematurely healed    Toe pain, left        Pes planus of both feet        Sciatica neuralgia, left        Foot drop, left          -Examined patient and discussed premature healed ulceration at Left hallux and treatment alternatives. - Debrided reactive keratosis using a sterile chisel blade. -Applied felt offloading toe pad and advised patient to do the same at home daily to prevent re-ulceration -Rx for accommodative orthotics or AFO brace for left was given for [place] clinic - Advised patient to go to the ER or return to office if the area worsens or if constitutional symptoms are present. -Patient to return to office in 8  weeks for follow up care and evaluation or sooner if problems arise.   Landis Martins, DPM

## 2015-10-30 ENCOUNTER — Ambulatory Visit (HOSPITAL_COMMUNITY)
Admission: RE | Admit: 2015-10-30 | Discharge: 2015-10-30 | Disposition: A | Discharge status: Home / Self Care | Payer: Medicaid Other | Source: Ambulatory Visit | Attending: Urology | Admitting: Urology

## 2015-10-30 ENCOUNTER — Ambulatory Visit (HOSPITAL_COMMUNITY): Payer: Medicaid Other

## 2015-10-30 ENCOUNTER — Telehealth: Payer: Self-pay | Admitting: *Deleted

## 2015-10-30 ENCOUNTER — Other Ambulatory Visit: Payer: Self-pay | Admitting: Urology

## 2015-10-30 ENCOUNTER — Ambulatory Visit (AMBULATORY_SURGERY_CENTER): Payer: Self-pay | Admitting: *Deleted

## 2015-10-30 VITALS — Ht 77.0 in | Wt 359.0 lb

## 2015-10-30 DIAGNOSIS — K760 Fatty (change of) liver, not elsewhere classified: Secondary | ICD-10-CM | POA: Insufficient documentation

## 2015-10-30 DIAGNOSIS — Z1211 Encounter for screening for malignant neoplasm of colon: Secondary | ICD-10-CM

## 2015-10-30 DIAGNOSIS — I517 Cardiomegaly: Secondary | ICD-10-CM | POA: Insufficient documentation

## 2015-10-30 DIAGNOSIS — Z905 Acquired absence of kidney: Secondary | ICD-10-CM | POA: Diagnosis not present

## 2015-10-30 DIAGNOSIS — C641 Malignant neoplasm of right kidney, except renal pelvis: Secondary | ICD-10-CM

## 2015-10-30 DIAGNOSIS — K5730 Diverticulosis of large intestine without perforation or abscess without bleeding: Secondary | ICD-10-CM | POA: Diagnosis not present

## 2015-10-30 NOTE — Telephone Encounter (Signed)
Thanks for letting me know. He can be directly booked for outpatient [facility] endoscopy.  It can be during my next scheduled hospital week.  Please get with my MA Vivien Rota to figure that out. Thanks very much.  - HD

## 2015-10-30 NOTE — Telephone Encounter (Signed)
Will forward to [place], thanks

## 2015-10-30 NOTE — Telephone Encounter (Signed)
Dr Loletha Carrow, I saw this patient in [place] today. His weight was 359.0lb with a BMI of 42.57. Per protocol, he will need to have a hospital colon. Do you want him to be a direct colon at [facility]  or have an office visit with you?  He recently had a radical right nephrectomy 08-27-2015.  He also had a hem positive stool which is why he was referred for a colonoscopy.  CC- patty lewis as well. Patty, I gave Anthony Logan blank prep instructions today in [place], we discussed these but told him if direct, he will need to fill in dates and times when scheduled.  Please advise, thanks Lelan Pons [place]

## 2015-10-30 NOTE — Progress Notes (Signed)
No egg or soy allergy known to patient  No issues with past sedation with any surgeries  or procedures, no intubation problems  No diet pills per patient No home 02 use per patient  No blood thinners per patient  Pt denies issues with constipation  emmi video declined BMI 42.57 but weight 359.0lb today in [place]. TE to Danis about need for hospital case vs OV

## 2015-10-31 ENCOUNTER — Other Ambulatory Visit: Payer: Self-pay

## 2015-10-31 DIAGNOSIS — R195 Other fecal abnormalities: Secondary | ICD-10-CM

## 2015-10-31 NOTE — Telephone Encounter (Signed)
Dr Loletha Carrow do you want to use Miralax prep with recent Nephrectomy? Please advise. Pt is scheduled for [facility] on 01-15-2016

## 2015-11-01 ENCOUNTER — Other Ambulatory Visit: Payer: Self-pay

## 2015-11-01 NOTE — Telephone Encounter (Signed)
Moviprep would be better, thanks

## 2015-11-01 NOTE — Telephone Encounter (Signed)
New instructions completed and awaiting for pt to come to the clinic to sign new paper work and get instructions.

## 2015-11-07 ENCOUNTER — Encounter: Payer: Medicaid Other | Admitting: Gastroenterology

## 2015-11-09 ENCOUNTER — Telehealth: Payer: Self-pay | Admitting: Family Medicine

## 2015-11-09 NOTE — Telephone Encounter (Signed)
No answer, called to f/u on labs received from nephro. A1C 6.8.   Will attempt again.   Laroy Apple, MD [facility] 11/09/2015, 5:55 PM

## 2015-11-22 ENCOUNTER — Ambulatory Visit (INDEPENDENT_AMBULATORY_CARE_PROVIDER_SITE_OTHER): Payer: Medicaid Other | Admitting: Family Medicine

## 2015-11-22 ENCOUNTER — Encounter: Payer: Self-pay | Admitting: Family Medicine

## 2015-11-22 VITALS — BP 143/88 | HR 82 | Temp 98.0°F | Ht 77.0 in | Wt 361.0 lb

## 2015-11-22 DIAGNOSIS — Z7985 Long-term (current) use of injectable non-insulin antidiabetic drugs: Secondary | ICD-10-CM | POA: Insufficient documentation

## 2015-11-22 DIAGNOSIS — F329 Major depressive disorder, single episode, unspecified: Secondary | ICD-10-CM

## 2015-11-22 DIAGNOSIS — E119 Type 2 diabetes mellitus without complications: Secondary | ICD-10-CM | POA: Diagnosis not present

## 2015-11-22 DIAGNOSIS — M5416 Radiculopathy, lumbar region: Secondary | ICD-10-CM

## 2015-11-22 DIAGNOSIS — I10 Essential (primary) hypertension: Secondary | ICD-10-CM

## 2015-11-22 DIAGNOSIS — F32A Depression, unspecified: Secondary | ICD-10-CM

## 2015-11-22 MED ORDER — DULOXETINE HCL 30 MG PO CPEP: 30.0000 mg | ORAL_CAPSULE | Freq: Every day | ORAL | Status: DC

## 2015-11-22 NOTE — Patient Instructions (Addendum)
Great to see you!  I have started cymbalta to help with mood and pain, it is a long and slow medicine and may take 4-6 weeks to kick in and show benefit.   Come back in 1 month to see me for mood and pain  PLease make an appointment to see one of our clinical pharmacists for diabetic education  Call for an eye exam, we will send paperwork with my referral.

## 2015-11-22 NOTE — Progress Notes (Signed)
   HPI  Patient presents today here to follow-up for hypertension, back pain and discuss depression and new-onset diabetes.  Diabetes. New-onset Has checked his blood sugar 2 or 3 times since his nephrologist told him, readings for fastings have been 105, 130, 140. Family history positive for diabetes.  Depression Feels worthless and likely is not contributing since his surgery. He would like to start medications for it. Also struggled with chronic back pain. He is about to seek chiropractic care for his back pain. Denies any suicidal thoughts.  Essential hypertension Good medication compliance Taking Lasix, HCTZ Recently started on lisinopril by his nephrologist, drying renal panel today  PMH: Smoking status noted ROS: Per HPI  Objective: BP 143/88 mmHg  Pulse 82  Temp(Src) 98 F (36.7 C) (Oral)  Ht 6\' 5"  (1.956 m)  Wt 361 lb (163.749 kg)  BMI 42.80 kg/m2 Gen: NAD, alert, cooperative with exam HEENT: NCAT CV: RRR, good S1/S2, no murmur Resp: CTABL, no wheezes, non-labored Abd: Right lower quadrant abdominal wound healing, approximately 1 cm in diameter roughly circular with some yellow crusting at the base Ext- trace pitting edema bilateral lower extremities Neuro: Alert and oriented, No gross deficits  Assessment and plan:  # Hypertension Reasonably well-managed Renal panel today He's now on HCTZ, Lasix, lisinopril. Also on hydralazine  # Depression Starting Cymbalta, should help with chronic back pain as well Start 30 mg, increase to 60 after 1 month Discussed risk of increasing suicidal thoughts, he denies any today   # Type 2 diabetes New onset No medication for now Increase physical activity Formal education with clinical pharmacist Eye exam ordered, has nephrologist  # Back pain Seeking chiropractic care, also adding Cymbalta    Orders Placed This Encounter  Procedures  . Renal Function Panel  . Ambulatory referral to Ophthalmology   Referral Priority:  Routine    Referral Type:  Consultation    Referral Reason:  Specialty Services Required    Requested Specialty:  Ophthalmology    Number of Visits Requested:  1    Meds ordered this encounter  Medications  . DULoxetine (CYMBALTA) 30 MG capsule    Sig: Take 1 capsule (30 mg total) by mouth daily.    Dispense:  30 capsule    Refill:  [place], MD [facility] 11/22/2015, 8:11 AM

## 2015-11-23 LAB — RENAL FUNCTION PANEL
ALBUMIN: 4.1 g/dL (ref 3.5–5.5)
BUN/Creatinine Ratio: 20 (ref 9–20)
BUN: 21 mg/dL (ref 6–24)
CO2: 28 mmol/L (ref 18–29)
Calcium: 9.3 mg/dL (ref 8.7–10.2)
Chloride: 92 mmol/L — ABNORMAL LOW (ref 96–106)
Creatinine, Ser: 1.06 mg/dL (ref 0.76–1.27)
GFR, EST AFRICAN AMERICAN: 92 mL/min/{1.73_m2} (ref >59)
GFR, EST NON AFRICAN AMERICAN: 79 mL/min/{1.73_m2} (ref >59)
GLUCOSE: 321 mg/dL — AB (ref 65–99)
Phosphorus: 2.7 mg/dL (ref 2.5–4.5)
Potassium: 4.4 mmol/L (ref 3.5–5.2)
Sodium: 138 mmol/L (ref 134–144)

## 2015-12-04 LAB — HM DIABETES EYE EXAM

## 2015-12-06 ENCOUNTER — Other Ambulatory Visit: Payer: Self-pay | Admitting: Pediatrics

## 2015-12-12 ENCOUNTER — Ambulatory Visit (INDEPENDENT_AMBULATORY_CARE_PROVIDER_SITE_OTHER): Payer: Medicaid Other | Admitting: Urology

## 2015-12-12 DIAGNOSIS — C641 Malignant neoplasm of right kidney, except renal pelvis: Secondary | ICD-10-CM | POA: Diagnosis not present

## 2015-12-24 ENCOUNTER — Encounter: Payer: Self-pay | Admitting: Sports Medicine

## 2015-12-24 ENCOUNTER — Ambulatory Visit (INDEPENDENT_AMBULATORY_CARE_PROVIDER_SITE_OTHER): Payer: Medicaid Other | Admitting: Sports Medicine

## 2015-12-24 DIAGNOSIS — L89620 Pressure ulcer of left heel, unstageable: Secondary | ICD-10-CM | POA: Diagnosis not present

## 2015-12-24 DIAGNOSIS — M21372 Foot drop, left foot: Secondary | ICD-10-CM

## 2015-12-24 DIAGNOSIS — M79675 Pain in left toe(s): Secondary | ICD-10-CM

## 2015-12-24 DIAGNOSIS — E1142 Type 2 diabetes mellitus with diabetic polyneuropathy: Secondary | ICD-10-CM

## 2015-12-24 DIAGNOSIS — L97521 Non-pressure chronic ulcer of other part of left foot limited to breakdown of skin: Secondary | ICD-10-CM

## 2015-12-24 DIAGNOSIS — M5432 Sciatica, left side: Secondary | ICD-10-CM

## 2015-12-24 DIAGNOSIS — M2141 Flat foot [pes planus] (acquired), right foot: Secondary | ICD-10-CM

## 2015-12-24 DIAGNOSIS — M2142 Flat foot [pes planus] (acquired), left foot: Secondary | ICD-10-CM

## 2015-12-24 NOTE — Progress Notes (Signed)
Patient ID: Anthony Logan, male   DOB: January 18, 1961, 55 y.o.   MRN: [id number]  Subjective: Anthony Logan is a 55 y.o. Diabetic male patient seen in office for follow up evaluation of ulceration at left big toe, reports that it started back bleeding a few weeks ago and had to redress it. States that he got new brace and inserts from [employer] and had blister to Left big toe which got better. Denies nausea/fever/vomiting/chills/night sweats/shortness of breath/pain. Patient has no other pedal complaints at this time.  Patient Active Problem List   Diagnosis Date Noted  . T2DM (type 2 diabetes mellitus) ([place]) 11/22/2015  . Delayed surgical wound healing 10/10/2015  . Healthcare maintenance 09/11/2015  . Renal mass 08/27/2015  . Right renal mass   . Edema 07/03/2015  . Abnormal MRI, lumbar spine 06/16/2015  . Radiculopathy of lumbar region 06/14/2015  . Mass of right kidney 06/13/2015  . Goiter 06/13/2015  . Proteinuria 03/27/2015  . Hyperlipidemia 03/27/2015  . Morbid obesity ([place]) 03/27/2015  . Mild dilation of ascending aorta (HCC) 06/01/2013  . Essential hypertension, benign 02/15/2013  . Precordial pain 02/15/2013  . Abnormal ECG 02/15/2013  . Mixed hyperlipidemia 02/15/2013   Current Outpatient Prescriptions on File Prior to Visit  Medication Sig Dispense Refill  . aspirin 81 MG chewable tablet Chew 81 mg by mouth daily.    . DULoxetine (CYMBALTA) 30 MG capsule Take 1 capsule (30 mg total) by mouth daily. 30 capsule 1  . furosemide (LASIX) 20 MG tablet TAKE TWO TABLETS BY MOUTH IN THE MORNING AND TAKE ONE TABLET IN THE AFTERNOON 90 tablet 2  . hydrALAZINE (APRESOLINE) 10 MG tablet Take 1 tablet (10 mg total) by mouth 3 (three) times daily. 90 tablet 2  . hydrochlorothiazide (HYDRODIURIL) 25 MG tablet TAKE ONE TABLET BY MOUTH ONCE DAILY 30 tablet 5  . metoprolol (LOPRESSOR) 100 MG tablet TAKE TWO TABLETS BY MOUTH TWICE DAILY 120 tablet 5  . Misc. Devices (HUGO ROLLING WALKER PREMIUM)  MISC 1 Units by Does not apply route once. 1 each 0  . mupirocin ointment (BACTROBAN) 2 % Apply to affected area daily. 30 g 0  . oxyCODONE-acetaminophen (PERCOCET) 10-325 MG tablet Take 1 tablet by mouth every 6 (six) hours as needed for pain.    . traMADol (ULTRAM) 50 MG tablet Take 1 tablet (50 mg total) by mouth every 8 (eight) hours as needed. 60 tablet 0   No current facility-administered medications on file prior to visit.    Allergies  Allergen Reactions  . Diltiazem Swelling    Wt gain,swelling hands,feet,gum bleeding  . Clonidine Derivatives Swelling  . Procardia [Nifedipine] Swelling    Swelling on feet and legs     Recent Results (from the past 2160 hour(s))  Fecal occult blood, imunochemical     Status: Abnormal   Collection Time: 10/12/15  4:11 PM  Result Value Ref Range   Fecal Occult Bld Positive (A) Negative  Renal Function Panel     Status: Abnormal   Collection Time: 11/22/15  8:23 AM  Result Value Ref Range   Glucose 321 (H) 65 - 99 mg/dL   BUN 21 6 - 24 mg/dL   Creatinine, Ser 1.06 0.76 - 1.27 mg/dL   GFR calc non Af Amer 79 >59 mL/min/1.73   GFR calc Af Amer 92 >59 mL/min/1.73   BUN/Creatinine Ratio 20 9 - 20   Sodium 138 134 - 144 mmol/L   Potassium 4.4 3.5 - 5.2 mmol/L  Chloride 92 (L) 96 - 106 mmol/L   CO2 28 18 - 29 mmol/L   Calcium 9.3 8.7 - 10.2 mg/dL   Phosphorus 2.7 2.5 - 4.5 mg/dL   Albumin 4.1 3.5 - 5.5 g/dL    Objective: There were no vitals filed for this visit.  General: Patient is awake, alert, oriented x 3 and in no acute distress.  Dermatology: Skin is warm and dry bilateral with a partial thickness ulceration at the Medial aspect of the left hallux with mild reactive keratosis and a underlying opening that measures 0.2x0.2x0.1cm, no malodor, no active drainage, no erythema, no edema. No acute signs of infection.   Vascular: Dorsalis Pedis pulse = 2/4 Bilateral,  Posterior Tibial pulse = 1/4 Bilateral,  Capillary Fill Time < 5  seconds  Neurologic: Protective sensation intact on right and diminished on left.  Musculosketal: No pain to palpation to ulcerated area at left medial hallux. No pain with compression to calves bilateral. Pes planus deformities noted bilateral. Left drop foot hx of sciatica and nerve damage.   Assessment and Plan:  Problem List Items Addressed This Visit    None    Visit Diagnoses    Toe ulcer, left, limited to breakdown of skin (H[facility])    -  Primary   Toe pain, left       Pes planus of both feet       Sciatica neuralgia, left       Foot drop, left       Diabetic polyneuropathy associated with type 2 diabetes mellitus ([place])          -Examined patient and discussed continued care for ulceration at Left hallux and treatment alternatives. - Debrided ulceration at left hallux to healthy bleeding borders applied iodosorb and recommended patient to apply betadine with well padded guaze dressing daily -Applied felt offloading pad to left insert -Continue with accommodative orthotics and AFO brace for left from [place] patient to go to the ER or return to office if the area worsens or if constitutional symptoms are present. -Patient to return to office in 4 weeks for follow up care and evaluation or sooner if problems arise.   Landis Martins, DPM

## 2015-12-27 ENCOUNTER — Encounter: Payer: Self-pay | Admitting: Family Medicine

## 2015-12-27 ENCOUNTER — Ambulatory Visit (INDEPENDENT_AMBULATORY_CARE_PROVIDER_SITE_OTHER): Payer: Medicaid Other | Admitting: Family Medicine

## 2015-12-27 VITALS — BP 142/80 | HR 69 | Temp 97.2°F | Ht 77.0 in | Wt 354.0 lb

## 2015-12-27 DIAGNOSIS — R0982 Postnasal drip: Secondary | ICD-10-CM | POA: Diagnosis not present

## 2015-12-27 DIAGNOSIS — E119 Type 2 diabetes mellitus without complications: Secondary | ICD-10-CM | POA: Diagnosis not present

## 2015-12-27 DIAGNOSIS — F32A Depression, unspecified: Secondary | ICD-10-CM

## 2015-12-27 DIAGNOSIS — F329 Major depressive disorder, single episode, unspecified: Secondary | ICD-10-CM

## 2015-12-27 LAB — BAYER DCA HB A1C WAIVED: HB A1C (BAYER DCA - WAIVED): 6.7 % (ref <7.0)

## 2015-12-27 MED ORDER — DULOXETINE HCL 60 MG PO CPEP: 60.0000 mg | ORAL_CAPSULE | Freq: Every day | ORAL | 5 refills | Status: DC

## 2015-12-27 NOTE — Progress Notes (Signed)
   HPI  Patient presents today for follow-up for depression and diabetes.  Diabetes Watching his diet more carefully Not checking blood sugars No medications.  Depression Slightly improved, denies suicidal thoughts. States that Cymbalta does not seem strong enough.  He has also had 2 days of nasal congestion and postnasal drip with frequent throat clearing.  PMH: Smoking status noted ROS: Per HPI  Objective: BP (!) 142/80   Pulse 69   Temp 97.2 F (36.2 C) (Oral)   Ht 6\' 5"  (1.956 m)   Wt (!) 354 lb (160.6 kg)   BMI 41.98 kg/m  Gen: NAD, alert, cooperative with exam HEENT: NCAT, no sinus tenderness to palpation CV: RRR, good S1/S2, no murmur Resp: CTABL, no wheezes, non-labored Ext: No edema, warm Neuro: Alert and oriented, No gross deficits  Diabetic Foot Exam - Simple   Simple Foot Form Visual Inspection See comments:  Yes Sensation Testing See comments:  Yes Pulse Check See comments:  Yes Comments Left foot with no sensation to monofilament, right foot within normal limits Left great toe with approximately 1 cm x 0.5 cm shallow ulceration with no drainage.      Assessment and plan:  # Type 2 diabetes A1c is 6.7, diet controlled Continue diet modifications Follow-up 3 months  # Depression Tolerating Cymbalta well, increase to 60 mg Denies suicidal thoughts  # Postnasal drip Recommended Flonase, his girlfriend has a sinus infection, will continue to return to clinic if it persists or worsens   Orders Placed This Encounter  Procedures  . Bayer DCA Hb A1c Waived  . Microalbumin / creatinine urine ratio    Meds ordered this encounter  Medications  . DULoxetine (CYMBALTA) 60 MG capsule    Sig: Take 1 capsule (60 mg total) by mouth daily.    Dispense:  30 capsule    Refill:  [place], MD [facility] 12/27/2015, 9:18 AM

## 2015-12-27 NOTE — Patient Instructions (Signed)
Great to see you!  Come back in 3 months  Start flonase twice daily for 1 week, then back off to once daily - this will help your sinuses.    Diet Recommendations for Diabetes   Starchy (carb) foods include: Bread, rice, pasta, potatoes, corn, crackers, bagels, muffins, all baked goods.   Protein foods include: Meat, fish, poultry, eggs, dairy foods, and beans such as pinto and kidney beans (beans also provide carbohydrate).   1. Eat at least 3 meals and 1-2 snacks per day. Never go more than 4-5 hours while awake without eating.  2. Limit starchy foods to TWO per meal and ONE per snack. ONE portion of a starchy  food is equal to the following:   - ONE slice of bread (or its equivalent, such as half of a hamburger bun).   - 1/2 cup of a "scoopable" starchy food such as potatoes or rice.   - 1 OUNCE (28 grams) of starchy snack foods such as crackers or pretzels (look on label).   - 15 grams of carbohydrate as shown on food label.  3. Both lunch and dinner should include a protein food, a carb food, and vegetables.   - Obtain twice as many veg's as protein or carbohydrate foods for both lunch and dinner.   - Try to keep frozen veg's on hand for a quick vegetable serving.     - Fresh or frozen veg's are best.  4. Breakfast should always include protein.

## 2015-12-28 LAB — MICROALBUMIN / CREATININE URINE RATIO
CREATININE, UR: 234.7 mg/dL
MICROALB/CREAT RATIO: 610.2 mg/g creat — ABNORMAL HIGH (ref 0.0–30.0)
Microalbumin, Urine: 1432.2 ug/mL

## 2016-01-01 ENCOUNTER — Telehealth: Payer: Self-pay | Admitting: Family Medicine

## 2016-01-01 MED ORDER — DULOXETINE HCL 60 MG PO CPEP: 60.0000 mg | ORAL_CAPSULE | Freq: Every day | ORAL | 5 refills | Status: DC

## 2016-01-01 NOTE — Telephone Encounter (Signed)
Med resent 

## 2016-01-03 ENCOUNTER — Encounter (HOSPITAL_COMMUNITY): Payer: Self-pay | Admitting: *Deleted

## 2016-01-05 ENCOUNTER — Other Ambulatory Visit: Payer: Self-pay | Admitting: Family Medicine

## 2016-01-15 ENCOUNTER — Encounter (HOSPITAL_COMMUNITY): Payer: Self-pay | Admitting: Anesthesiology

## 2016-01-15 ENCOUNTER — Ambulatory Visit (HOSPITAL_COMMUNITY): Admission: RE | Admit: 2016-01-15 | Payer: Medicaid Other | Source: Ambulatory Visit | Admitting: Gastroenterology

## 2016-01-15 ENCOUNTER — Encounter (HOSPITAL_COMMUNITY): Payer: Self-pay | Admitting: Certified Registered"

## 2016-01-15 SURGERY — COLONOSCOPY WITH PROPOFOL
Anesthesia: Monitor Anesthesia Care

## 2016-01-15 MED ORDER — PROPOFOL 10 MG/ML IV BOLUS
INTRAVENOUS | Status: AC
  Filled 2016-01-15: qty 40

## 2016-01-15 MED ORDER — LIDOCAINE 2% (20 MG/ML) 5 ML SYRINGE
INTRAMUSCULAR | Status: AC
  Filled 2016-01-15: qty 5

## 2016-01-15 NOTE — Anesthesia Preprocedure Evaluation (Deleted)
Anesthesia Evaluation  Patient identified by MRN, date of birth, ID band Patient awake    Reviewed: Allergy & Precautions, NPO status , Patient's Chart, lab work & pertinent test results, reviewed documented beta blocker date and time   Airway Mallampati: II  TM Distance: >3 FB Neck ROM: Full    Dental  (+) Teeth Intact, Dental Advisory Given   Pulmonary sleep apnea , former smoker,    breath sounds clear to auscultation       Cardiovascular hypertension, Pt. on medications and Pt. on home beta blockers + Peripheral Vascular Disease   Rhythm:Regular Rate:Normal     Neuro/Psych  Neuromuscular disease negative psych ROS   GI/Hepatic negative GI ROS, Neg liver ROS,   Endo/Other  diabetesMorbid obesity  Renal/GU CRFRenal disease  negative genitourinary   Musculoskeletal  (+) Arthritis ,   Abdominal (+) + obese,  Abdomen: soft.    Peds negative pediatric ROS (+)  Hematology negative hematology ROS (+)   Anesthesia Other Findings -HLD -   Reproductive/Obstetrics negative OB ROS                             Lab Results  Component Value Date   WBC 12.6 (H) 08/28/2015   HGB 13.3 08/28/2015   HCT 38.6 (L) 08/28/2015   MCV 90.2 08/28/2015   PLT 224 08/28/2015   Lab Results  Component Value Date   CREATININE 1.06 11/22/2015   BUN 21 11/22/2015   NA 138 11/22/2015   K 4.4 11/22/2015   CL 92 (L) 11/22/2015   CO2 28 11/22/2015   Lab Results  Component Value Date   INR 1.09 07/12/2015   08/2015 EKG: sinus bradycardia.  04/2015 Echo - Left ventricle: The cavity size was mildly dilated. There was severe concentric hypertrophy. Systolic function was normal. The estimated ejection fraction was in the range of 60% to 65%. Wall motion was normal; there were no regional wall motion abnormalities. Features are consistent with a pseudonormal left ventricular filling pattern, with concomitant  abnormal relaxation and increased filling pressure (grade 2 diastolic dysfunction). Doppler parameters are consistent with high ventricular filling pressure. - Aortic valve: Moderately calcified annulus. There was moderate regurgitation. Valve area (VTI): 5.88 cm^2. Valve area (Vmax): 4.63 cm^2. Valve area (Vmean): 4.63 cm^2. - Aorta: Moderate aortic root dilatation. Aortic root dimension: 49 mm (ED). - Mitral valve: Calcified annulus. Mildly thickened leaflets . - Left atrium: The atrium was mildly dilated.   Anesthesia Physical  Anesthesia Plan  ASA: III  Anesthesia Plan: General   Post-op Pain Management:    Induction: Intravenous  Airway Management Planned: Simple Face Mask  Additional Equipment:   Intra-op Plan:   Post-operative Plan:   Informed Consent: I have reviewed the patients History and Physical, chart, labs and discussed the procedure including the risks, benefits and alternatives for the proposed anesthesia with the patient or authorized representative who has indicated his/her understanding and acceptance.   Dental advisory given  Plan Discussed with: CRNA  Anesthesia Plan Comments:         Anesthesia Quick Evaluation

## 2016-01-22 ENCOUNTER — Ambulatory Visit: Payer: Medicaid Other | Admitting: Sports Medicine

## 2016-02-05 ENCOUNTER — Ambulatory Visit (INDEPENDENT_AMBULATORY_CARE_PROVIDER_SITE_OTHER): Payer: Medicaid Other | Admitting: Sports Medicine

## 2016-02-05 ENCOUNTER — Encounter: Payer: Self-pay | Admitting: Sports Medicine

## 2016-02-05 DIAGNOSIS — M2142 Flat foot [pes planus] (acquired), left foot: Secondary | ICD-10-CM

## 2016-02-05 DIAGNOSIS — E1142 Type 2 diabetes mellitus with diabetic polyneuropathy: Secondary | ICD-10-CM

## 2016-02-05 DIAGNOSIS — M2141 Flat foot [pes planus] (acquired), right foot: Secondary | ICD-10-CM

## 2016-02-05 DIAGNOSIS — L89894 Pressure ulcer of other site, stage 4: Secondary | ICD-10-CM | POA: Diagnosis not present

## 2016-02-05 DIAGNOSIS — L97521 Non-pressure chronic ulcer of other part of left foot limited to breakdown of skin: Secondary | ICD-10-CM

## 2016-02-05 DIAGNOSIS — M79675 Pain in left toe(s): Secondary | ICD-10-CM

## 2016-02-05 DIAGNOSIS — M5432 Sciatica, left side: Secondary | ICD-10-CM

## 2016-02-05 DIAGNOSIS — M21372 Foot drop, left foot: Secondary | ICD-10-CM

## 2016-02-05 NOTE — Patient Instructions (Signed)
Change dressing every other day as instructed using Prisma

## 2016-02-05 NOTE — Progress Notes (Signed)
Patient ID: Anthony Logan, male   DOB: 06-24-1960, 55 y.o.   MRN: [id number]  Subjective: Anthony Logan is a 55 y.o. Diabetic male patient seen in office for follow up evaluation of ulceration at left big toe, reports that it still bleeding and that he has been doing more because he parents are sick. States has been using antibiotic cream. Denies nausea/fever/vomiting/chills/night sweats/shortness of breath/pain. Patient has no other pedal complaints at this time.  FBS 103  Patient Active Problem List   Diagnosis Date Noted  . Depression 12/27/2015  . T2DM (type 2 diabetes mellitus) ([place]) 11/22/2015  . Delayed surgical wound healing 10/10/2015  . Healthcare maintenance 09/11/2015  . Renal mass 08/27/2015  . Right renal mass   . Edema 07/03/2015  . Abnormal MRI, lumbar spine 06/16/2015  . Radiculopathy of lumbar region 06/14/2015  . Mass of right kidney 06/13/2015  . Goiter 06/13/2015  . Proteinuria 03/27/2015  . Hyperlipidemia 03/27/2015  . Morbid obesity (Anthony Logan) 03/27/2015  . Mild dilation of ascending aorta (HCC) 06/01/2013  . Essential hypertension, benign 02/15/2013  . Precordial pain 02/15/2013  . Abnormal ECG 02/15/2013  . Mixed hyperlipidemia 02/15/2013   Current Outpatient Prescriptions on File Prior to Visit  Medication Sig Dispense Refill  . aspirin 81 MG chewable tablet Chew 81 mg by mouth daily.    . DULoxetine (CYMBALTA) 60 MG capsule Take 1 capsule (60 mg total) by mouth daily. 30 capsule 5  . furosemide (LASIX) 20 MG tablet TAKE TWO TABLETS BY MOUTH IN THE MORNING AND TAKE ONE TABLET IN THE AFTERNOON 90 tablet 2  . hydrALAZINE (APRESOLINE) 10 MG tablet TAKE ONE TABLET BY MOUTH THREE TIMES DAILY 90 tablet 2  . hydrochlorothiazide (HYDRODIURIL) 25 MG tablet TAKE ONE TABLET BY MOUTH ONCE DAILY 30 tablet 5  . metoprolol (LOPRESSOR) 100 MG tablet TAKE TWO TABLETS BY MOUTH TWICE DAILY 120 tablet 5  . Misc. Devices (HUGO ROLLING WALKER PREMIUM) MISC 1 Units by Does not  apply route once. 1 each 0  . mupirocin ointment (BACTROBAN) 2 % Apply to affected area daily. 30 g 0  . oxyCODONE-acetaminophen (PERCOCET) 10-325 MG tablet Take 1 tablet by mouth every 6 (six) hours as needed for pain.    . traMADol (ULTRAM) 50 MG tablet Take 1 tablet (50 mg total) by mouth every 8 (eight) hours as needed. 60 tablet 0   No current facility-administered medications on file prior to visit.    Allergies  Allergen Reactions  . Diltiazem Swelling    Wt gain,swelling hands,feet,gum bleeding  . Clonidine Derivatives Swelling  . Procardia [Nifedipine] Swelling    Swelling on feet and legs     Recent Results (from the past 2160 hour(s))  Renal Function Panel     Status: Abnormal   Collection Time: 11/22/15  8:23 AM  Result Value Ref Range   Glucose 321 (H) 65 - 99 mg/dL   BUN 21 6 - 24 mg/dL   Creatinine, Ser 1.06 0.76 - 1.27 mg/dL   GFR calc non Af Amer 79 >59 mL/min/1.73   GFR calc Af Amer 92 >59 mL/min/1.73   BUN/Creatinine Ratio 20 9 - 20   Sodium 138 134 - 144 mmol/L   Potassium 4.4 3.5 - 5.2 mmol/L   Chloride 92 (L) 96 - 106 mmol/L   CO2 28 18 - 29 mmol/L   Calcium 9.3 8.7 - 10.2 mg/dL   Phosphorus 2.7 2.5 - 4.5 mg/dL   Albumin 4.1 3.5 - 5.5 g/dL  Bayer DCA Hb A1c Waived     Status: None   Collection Time: 12/27/15  8:53 AM  Result Value Ref Range   Bayer DCA Hb A1c Waived 6.7 <7.0 %    Comment:                                       Diabetic Adult            <7.0                                       Healthy Adult        4.3 - 5.7                                                           (DCCT/NGSP) American Diabetes Association's Summary of Glycemic Recommendations for Adults with Diabetes: Hemoglobin A1c <7.0%. More stringent glycemic goals (A1c <6.0%) may further reduce complications at the cost of increased risk of hypoglycemia.   Microalbumin / creatinine urine ratio     Status: Abnormal   Collection Time: 12/27/15  9:23 AM  Result Value Ref Range    Creatinine, Urine 234.7 Not Estab. mg/dL   Microalbum.,Anthony [id number] Not Estab. ug/mL    Comment: Results confirmed on dilution.    MICROALB/CREAT RATIO 610.2 (H) 0.0 - 30.0 mg/g creat    Objective: There were no vitals filed for this visit.  General: Patient is awake, alert, oriented x 3 and in no acute distress.  Dermatology: Skin is warm and dry bilateral with a partial thickness ulceration at the Medial aspect of the left hallux with mild reactive keratosis and a underlying opening that measures 0.3x0.3x0.1cm, no malodor, no active drainage, no erythema, no edema. No acute signs of infection.   Vascular: Dorsalis Pedis pulse = 2/4 Bilateral,  Posterior Tibial pulse = 1/4 Bilateral,  Capillary Fill Time < 5 seconds  Neurologic: Protective sensation intact on right and diminished on left.  Musculosketal: No pain to palpation to ulcerated area at left medial hallux. No pain with compression to calves bilateral. Pes planus deformities noted bilateral. Left drop foot hx of sciatica and nerve damage.   Assessment and Plan:  Problem List Items Addressed This Visit    None    Visit Diagnoses    Toe ulcer, left, limited to breakdown of skin (HCC)    -  Primary   Toe pain, left       Pes planus of both feet       Sciatica neuralgia, left       Foot drop, left       Diabetic polyneuropathy associated with type 2 diabetes mellitus ([place])          -Examined patient and discussed continued care for ulceration at Left hallux and treatment alternatives. - Debrided ulceration at left hallux to healthy bleeding borders applied Prisma Ag secured with well padded guaze. Patient to continue with dressing site as instructed using remaining Prisma every other day. -Continue with felt offloading pad to left insert -Continue with accommodative orthotics and AFO brace for left from [facility] - Advised patient to  go to the ER or return to office if the area worsens or if constitutional  symptoms are present. -Patient to return to office in 3 weeks for follow up care and evaluation or sooner if problems arise.   Landis Martins, DPM

## 2016-02-15 ENCOUNTER — Other Ambulatory Visit: Payer: Medicaid Other

## 2016-02-19 ENCOUNTER — Telehealth: Payer: Self-pay | Admitting: *Deleted

## 2016-02-19 MED ORDER — AMOXICILLIN-POT CLAVULANATE 875-125 MG PO TABS: 1.0000 | ORAL_TABLET | Freq: Two times a day (BID) | ORAL | 0 refills | Status: DC

## 2016-02-19 NOTE — Telephone Encounter (Addendum)
Pt states he was seen 2 weeks ago and Dr. Cannon Kettle stated if the toe appeared infected to call for an antibiotic. Dr. Cannon Kettle ordered Augmentin, and I informed pt of orders.

## 2016-02-19 NOTE — Telephone Encounter (Signed)
Augmentin is fine  Thanks Dr Cannon Kettle

## 2016-02-26 ENCOUNTER — Encounter: Payer: Self-pay | Admitting: Sports Medicine

## 2016-02-26 ENCOUNTER — Ambulatory Visit (INDEPENDENT_AMBULATORY_CARE_PROVIDER_SITE_OTHER): Payer: Medicaid Other | Admitting: Sports Medicine

## 2016-02-26 DIAGNOSIS — M2142 Flat foot [pes planus] (acquired), left foot: Secondary | ICD-10-CM

## 2016-02-26 DIAGNOSIS — E1142 Type 2 diabetes mellitus with diabetic polyneuropathy: Secondary | ICD-10-CM

## 2016-02-26 DIAGNOSIS — M21372 Foot drop, left foot: Secondary | ICD-10-CM

## 2016-02-26 DIAGNOSIS — M2141 Flat foot [pes planus] (acquired), right foot: Secondary | ICD-10-CM

## 2016-02-26 DIAGNOSIS — M79675 Pain in left toe(s): Secondary | ICD-10-CM

## 2016-02-26 DIAGNOSIS — L97521 Non-pressure chronic ulcer of other part of left foot limited to breakdown of skin: Secondary | ICD-10-CM

## 2016-02-26 DIAGNOSIS — L97501 Non-pressure chronic ulcer of other part of unspecified foot limited to breakdown of skin: Secondary | ICD-10-CM | POA: Diagnosis not present

## 2016-02-26 MED ORDER — AMOXICILLIN-POT CLAVULANATE 875-125 MG PO TABS: 1.0000 | ORAL_TABLET | Freq: Two times a day (BID) | ORAL | 0 refills | Status: DC

## 2016-02-26 NOTE — Progress Notes (Signed)
Patient ID: Anthony Logan, male   DOB: 1960/08/31, 55 y.o.   MRN: [id number]  Subjective: Anthony Logan is a 55 y.o. Diabetic male patient seen in office for follow up evaluation of ulceration at left big toe, reports that it still bleeding and that he has been doing more and not dressing like he should because has had a lot going on. States has been using Prisma and taking Augmentin. Denies nausea/fever/vomiting/chills/night sweats/shortness of breath/pain. Patient has no other pedal complaints at this time.  FBS not recorded  Patient Active Problem List   Diagnosis Date Noted  . Depression 12/27/2015  . T2DM (type 2 diabetes mellitus) ([place]) 11/22/2015  . Delayed surgical wound healing 10/10/2015  . Healthcare maintenance 09/11/2015  . Renal mass 08/27/2015  . Right renal mass   . Edema 07/03/2015  . Abnormal MRI, lumbar spine 06/16/2015  . Radiculopathy of lumbar region 06/14/2015  . Mass of right kidney 06/13/2015  . Goiter 06/13/2015  . Proteinuria 03/27/2015  . Hyperlipidemia 03/27/2015  . Morbid obesity ([place]) 03/27/2015  . Mild dilation of ascending aorta (HCC) 06/01/2013  . Essential hypertension, benign 02/15/2013  . Precordial pain 02/15/2013  . Abnormal ECG 02/15/2013  . Mixed hyperlipidemia 02/15/2013   Current Outpatient Prescriptions on File Prior to Visit  Medication Sig Dispense Refill  . aspirin 81 MG chewable tablet Chew 81 mg by mouth daily.    . DULoxetine (CYMBALTA) 60 MG capsule Take 1 capsule (60 mg total) by mouth daily. 30 capsule 5  . furosemide (LASIX) 20 MG tablet TAKE TWO TABLETS BY MOUTH IN THE MORNING AND TAKE ONE TABLET IN THE AFTERNOON 90 tablet 2  . hydrALAZINE (APRESOLINE) 10 MG tablet TAKE ONE TABLET BY MOUTH THREE TIMES DAILY 90 tablet 2  . hydrochlorothiazide (HYDRODIURIL) 25 MG tablet TAKE ONE TABLET BY MOUTH ONCE DAILY 30 tablet 5  . metoprolol (LOPRESSOR) 100 MG tablet TAKE TWO TABLETS BY MOUTH TWICE DAILY 120 tablet 5  . Misc. Devices  (HUGO ROLLING WALKER PREMIUM) MISC 1 Units by Does not apply route once. 1 each 0  . mupirocin ointment (BACTROBAN) 2 % Apply to affected area daily. 30 g 0  . oxyCODONE-acetaminophen (PERCOCET) 10-325 MG tablet Take 1 tablet by mouth every 6 (six) hours as needed for pain.    . traMADol (ULTRAM) 50 MG tablet Take 1 tablet (50 mg total) by mouth every 8 (eight) hours as needed. 60 tablet 0   No current facility-administered medications on file prior to visit.    Allergies  Allergen Reactions  . Diltiazem Swelling    Wt gain,swelling hands,feet,gum bleeding  . Clonidine Derivatives Swelling  . Procardia [Nifedipine] Swelling    Swelling on feet and legs     Recent Results (from the past 2160 hour(s))  Bayer DCA Hb A1c Waived     Status: None   Collection Time: 12/27/15  8:53 AM  Result Value Ref Range   Bayer DCA Hb A1c Waived 6.7 <7.0 %    Comment:                                       Diabetic Adult            <7.0  Healthy Adult        4.3 - 5.7                                                           (DCCT/NGSP) American Diabetes Association's Summary of Glycemic Recommendations for Adults with Diabetes: Hemoglobin A1c <7.0%. More stringent glycemic goals (A1c <6.0%) may further reduce complications at the cost of increased risk of hypoglycemia.   Microalbumin / creatinine urine ratio     Status: Abnormal   Collection Time: 12/27/15  9:23 AM  Result Value Ref Range   Creatinine, Urine 234.7 Not Estab. mg/dL   Microalbum.,Leonard Schwartz [id number] Not Estab. ug/mL    Comment: Results confirmed on dilution.    MICROALB/CREAT RATIO 610.2 (H) 0.0 - 30.0 mg/g creat    Objective: There were no vitals filed for this visit.  General: Patient is awake, alert, oriented x 3 and in no acute distress.  Dermatology: Skin is warm and dry bilateral with a partial thickness ulceration at the plantar Medial aspect of the left hallux with mild reactive  keratosis and a underlying opening that measures 0.6x0.6x0.1cm (last measurement 0.3x0.3x0.1cm) , no malodor, no active drainage, no erythema, no edema. No acute signs of infection.   Vascular: Dorsalis Pedis pulse = 2/4 Bilateral,  Posterior Tibial pulse = 1/4 Bilateral,  Capillary Fill Time < 5 seconds  Neurologic: Protective sensation intact on right and diminished on left.  Musculosketal: No pain to palpation to ulcerated area at left medial hallux. No pain with compression to calves bilateral. Pes planus deformities noted bilateral. Left drop foot hx of sciatica and nerve damage.   Assessment and Plan:  Problem List Items Addressed This Visit    None    Visit Diagnoses    Toe ulcer, left, limited to breakdown of skin (HCC)    -  Primary   Toe pain, left       Pes planus of both feet       Foot drop, left       Diabetic polyneuropathy associated with type 2 diabetes mellitus ([place])          -Examined patient and discussed continued care for ulceration at Left hallux and treatment alternatives. - Debrided ulceration at left hallux to healthy bleeding borders applied Prisma Ag secured with well padded guaze. Patient to continue with dressing site as instructed using remaining Prisma every other day. -Refilled Augmentin  -Continue with felt offloading pad to left insert -Continue with accommodative orthotics and AFO brace for left from [place] patient to go to the ER or return to office if the area worsens or if constitutional symptoms are present. -Patient to return to office in 2-3 weeks for follow up care and evaluation or sooner if problems arise. If no healing will consider  Wound culture, x-rays and OR graft.   Landis Martins, DPM

## 2016-03-04 ENCOUNTER — Other Ambulatory Visit: Payer: Self-pay | Admitting: Urology

## 2016-03-04 ENCOUNTER — Ambulatory Visit (HOSPITAL_COMMUNITY)
Admission: RE | Admit: 2016-03-04 | Discharge: 2016-03-04 | Disposition: A | Discharge status: Home / Self Care | Payer: Medicaid Other | Source: Ambulatory Visit | Attending: Urology | Admitting: Urology

## 2016-03-04 DIAGNOSIS — C641 Malignant neoplasm of right kidney, except renal pelvis: Secondary | ICD-10-CM

## 2016-03-04 DIAGNOSIS — I517 Cardiomegaly: Secondary | ICD-10-CM | POA: Insufficient documentation

## 2016-03-05 ENCOUNTER — Other Ambulatory Visit: Payer: Self-pay | Admitting: Family Medicine

## 2016-03-11 ENCOUNTER — Telehealth: Payer: Self-pay | Admitting: *Deleted

## 2016-03-11 ENCOUNTER — Encounter: Payer: Self-pay | Admitting: Sports Medicine

## 2016-03-11 ENCOUNTER — Ambulatory Visit (INDEPENDENT_AMBULATORY_CARE_PROVIDER_SITE_OTHER): Payer: Medicaid Other | Admitting: Sports Medicine

## 2016-03-11 DIAGNOSIS — M79675 Pain in left toe(s): Secondary | ICD-10-CM

## 2016-03-11 DIAGNOSIS — M21372 Foot drop, left foot: Secondary | ICD-10-CM

## 2016-03-11 DIAGNOSIS — M2142 Flat foot [pes planus] (acquired), left foot: Secondary | ICD-10-CM | POA: Diagnosis not present

## 2016-03-11 DIAGNOSIS — M2141 Flat foot [pes planus] (acquired), right foot: Secondary | ICD-10-CM | POA: Diagnosis not present

## 2016-03-11 DIAGNOSIS — L97521 Non-pressure chronic ulcer of other part of left foot limited to breakdown of skin: Secondary | ICD-10-CM

## 2016-03-11 DIAGNOSIS — E1142 Type 2 diabetes mellitus with diabetic polyneuropathy: Secondary | ICD-10-CM

## 2016-03-11 DIAGNOSIS — G629 Polyneuropathy, unspecified: Secondary | ICD-10-CM

## 2016-03-11 NOTE — Progress Notes (Signed)
Patient ID: Anthony Logan, male   DOB: [birth year], 55 y.o.   MRN: [id number]  Subjective: Anthony Logan is a 55 y.o. Diabetic male patient seen in office for follow up evaluation of ulceration at left big toe, reports that it is doing better, no drainage and has finished antibiotics. States has been using Prisma and offloading pad as instructed. Denies nausea/fever/vomiting/chills/shortness of breath/pain. Patient has no other pedal complaints at this time.  FBS not recorded  Patient Active Problem List   Diagnosis Date Noted  . Depression 12/27/2015  . T2DM (type 2 diabetes mellitus) ([place]) 11/22/2015  . Delayed surgical wound healing 10/10/2015  . Healthcare maintenance 09/11/2015  . Renal mass 08/27/2015  . Right renal mass   . Edema 07/03/2015  . Abnormal MRI, lumbar spine 06/16/2015  . Radiculopathy of lumbar region 06/14/2015  . Mass of right kidney 06/13/2015  . Goiter 06/13/2015  . Proteinuria 03/27/2015  . Hyperlipidemia 03/27/2015  . Morbid obesity ([place]) 03/27/2015  . Mild dilation of ascending aorta (HCC) 06/01/2013  . Essential hypertension, benign 02/15/2013  . Precordial pain 02/15/2013  . Abnormal ECG 02/15/2013  . Mixed hyperlipidemia 02/15/2013   Current Outpatient Prescriptions on File Prior to Visit  Medication Sig Dispense Refill  . amoxicillin-clavulanate (AUGMENTIN) 875-125 MG tablet Take 1 tablet by mouth 2 (two) times daily. 20 tablet 0  . aspirin 81 MG chewable tablet Chew 81 mg by mouth daily.    . DULoxetine (CYMBALTA) 60 MG capsule Take 1 capsule (60 mg total) by mouth daily. 30 capsule 5  . furosemide (LASIX) 20 MG tablet TAKE TWO TABLETS BY MOUTH IN THE MORNING AND ONE IN THE AFTERNOON 90 tablet 2  . hydrALAZINE (APRESOLINE) 10 MG tablet TAKE ONE TABLET BY MOUTH THREE TIMES DAILY 90 tablet 2  . hydrochlorothiazide (HYDRODIURIL) 25 MG tablet TAKE ONE TABLET BY MOUTH ONCE DAILY 30 tablet 5  . metoprolol (LOPRESSOR) 100 MG tablet TAKE TWO TABLETS BY  MOUTH TWICE DAILY 120 tablet 5  . Misc. Devices (HUGO ROLLING WALKER PREMIUM) MISC 1 Units by Does not apply route once. 1 each 0  . mupirocin ointment (BACTROBAN) 2 % Apply to affected area daily. 30 g 0  . oxyCODONE-acetaminophen (PERCOCET) 10-325 MG tablet Take 1 tablet by mouth every 6 (six) hours as needed for pain.    . traMADol (ULTRAM) 50 MG tablet Take 1 tablet (50 mg total) by mouth every 8 (eight) hours as needed. 60 tablet 0   No current facility-administered medications on file prior to visit.    Allergies  Allergen Reactions  . Diltiazem Swelling    Wt gain,swelling hands,feet,gum bleeding  . Clonidine Derivatives Swelling  . Procardia [Nifedipine] Swelling    Swelling on feet and legs     Recent Results (from the past 2160 hour(s))  Bayer DCA Hb A1c Waived     Status: None   Collection Time: 12/27/15  8:53 AM  Result Value Ref Range   Bayer DCA Hb A1c Waived 6.7 <7.0 %    Comment:                                       Diabetic Adult            <7.0  Healthy Adult        4.3 - 5.7                                                           (DCCT/NGSP) American Diabetes Association's Summary of Glycemic Recommendations for Adults with Diabetes: Hemoglobin A1c <7.0%. More stringent glycemic goals (A1c <6.0%) may further reduce complications at the cost of increased risk of hypoglycemia.   Microalbumin / creatinine urine ratio     Status: Abnormal   Collection Time: 12/27/15  9:23 AM  Result Value Ref Range   Creatinine, Urine 234.7 Not Estab. mg/dL   Microalbum.,Leonard Schwartz [id number] Not Estab. ug/mL    Comment: Results confirmed on dilution.    MICROALB/CREAT RATIO 610.2 (H) 0.0 - 30.0 mg/g creat    Objective: There were no vitals filed for this visit.  General: Patient is awake, alert, oriented x 3 and in no acute distress.  Dermatology: Skin is warm and dry bilateral with a partial thickness ulceration at the plantar Medial  aspect of the left hallux with mild reactive keratosis and a underlying opening that measures 0.2x0.1x0.1cm (last measurement 0.6x0.6x0.1cm) , no malodor, no active drainage, no erythema, no edema. No acute signs of infection.   Vascular: Dorsalis Pedis pulse = 2/4 Bilateral,  Posterior Tibial pulse = 1/4 Bilateral,  Capillary Fill Time < 5 seconds  Neurologic: Protective sensation intact on right and diminished on left. Sharp pain not relieved by Cymbalta, bilateral consistent with neuropathic pain.  Musculosketal: No pain to palpation to ulcerated area at left medial hallux. No pain with compression to calves bilateral. Pes planus deformities noted bilateral. Left drop foot hx of sciatica and nerve damage.   Assessment and Plan:  Problem List Items Addressed This Visit    None    Visit Diagnoses    Toe ulcer, left, limited to breakdown of skin (HCC)    -  Primary   Toe pain, left       Pes planus of both feet       Foot drop, left       Diabetic polyneuropathy associated with type 2 diabetes mellitus ([place])          -Examined patient and discussed continued care for ulceration at Left hallux and treatment alternatives. - Debrided ulceration at left hallux to healthy bleeding borders applied Prisma Ag secured with well padded guaze. Patient to continue with dressing site as instructed using remaining Prisma every other day. -Continue with felt offloading pad to left insert -Continue with accommodative orthotics and AFO brace for left from [place] patient to go to the ER or return to office if the area worsens or if constitutional symptoms are present. -Consult placed to neurology for continued neuropathic pain. No relief with Cymbalta. -Patient to return to office in 3 weeks for follow up care and evaluation or sooner if problems arise. If no healing will consider  Wound culture, x-rays and OR graft.   Landis Martins, DPM

## 2016-03-11 NOTE — Telephone Encounter (Addendum)
-----   Message from Landis Martins, [place] sent at 03/11/2016  9:46 AM EDT ----- Regarding: Neurology consult Diabetic with continued worsening neuropathy pain on Cymbalta with no relief -Dr. Cannon Kettle. Faxed referral, pt clinicals and demographics to [facility].

## 2016-03-12 ENCOUNTER — Ambulatory Visit (INDEPENDENT_AMBULATORY_CARE_PROVIDER_SITE_OTHER): Payer: Medicaid Other | Admitting: Urology

## 2016-03-12 DIAGNOSIS — C641 Malignant neoplasm of right kidney, except renal pelvis: Secondary | ICD-10-CM

## 2016-03-17 ENCOUNTER — Ambulatory Visit (INDEPENDENT_AMBULATORY_CARE_PROVIDER_SITE_OTHER): Payer: Medicaid Other | Admitting: Neurology

## 2016-03-17 ENCOUNTER — Other Ambulatory Visit (INDEPENDENT_AMBULATORY_CARE_PROVIDER_SITE_OTHER): Payer: Medicaid Other

## 2016-03-17 ENCOUNTER — Encounter: Payer: Self-pay | Admitting: Neurology

## 2016-03-17 VITALS — BP 150/90 | HR 58 | Ht 77.0 in | Wt 357.5 lb

## 2016-03-17 DIAGNOSIS — E0842 Diabetes mellitus due to underlying condition with diabetic polyneuropathy: Secondary | ICD-10-CM

## 2016-03-17 DIAGNOSIS — M21372 Foot drop, left foot: Secondary | ICD-10-CM | POA: Diagnosis not present

## 2016-03-17 DIAGNOSIS — M5417 Radiculopathy, lumbosacral region: Secondary | ICD-10-CM | POA: Diagnosis not present

## 2016-03-17 LAB — VITAMIN B12: Vitamin B-12: 192 pg/mL — ABNORMAL LOW (ref 211–911)

## 2016-03-17 MED ORDER — GABAPENTIN 300 MG PO CAPS: 300.0000 mg | ORAL_CAPSULE | Freq: Three times a day (TID) | ORAL | 5 refills | Status: DC
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
## 2016-03-17 NOTE — Patient Instructions (Addendum)
1.  Start Gabapentin 300 mg tablets    Morning       Afternoon        Evening  Week 1                                  1 tab              Week 2 1 tab                   1 tab              Continue  1 tab          1 tab            1 tab         Call with update in 1 month, to determine further increase in medication.  If you develop increased sleepiness, stay at the lower dose.           2.  Follow-up with [facility] for your left foot drop.  Call [phone] to schedule a return visit.  3.  If you would like to proceed with nerve testing, please call my office  4.  Check blood work  Return to clinic in 6 months

## 2016-03-19 LAB — COPPER, SERUM: Copper: 118 ug/dL (ref 72–166)

## 2016-03-19 LAB — PROTEIN ELECTROPHORESIS, SERUM
ALBUMIN ELP: 4.1 g/dL (ref 3.8–4.8)
ALPHA-2-GLOBULIN: 0.8 g/dL (ref 0.5–0.9)
Alpha-1-Globulin: 0.3 g/dL (ref 0.2–0.3)
BETA 2: 0.5 g/dL (ref 0.2–0.5)
BETA GLOBULIN: 0.5 g/dL (ref 0.4–0.6)
Gamma Globulin: 1.2 g/dL (ref 0.8–1.7)
Total Protein, Serum Electrophoresis: 7.3 g/dL (ref 6.1–8.1)

## 2016-03-19 LAB — IMMUNOFIXATION ELECTROPHORESIS
IGM, SERUM: 97 mg/dL (ref 48–271)
IgA: 368 mg/dL (ref 81–463)
IgG (Immunoglobin G), Serum: 1180 mg/dL (ref 694–1618)

## 2016-03-22 LAB — VITAMIN B1: Vitamin B1 (Thiamine): 9 nmol/L (ref 8–30)

## 2016-03-24 ENCOUNTER — Other Ambulatory Visit: Payer: Self-pay | Admitting: *Deleted

## 2016-03-24 DIAGNOSIS — E538 Deficiency of other specified B group vitamins: Secondary | ICD-10-CM

## 2016-03-24 MED ORDER — CYANOCOBALAMIN 1000 MCG/ML IJ SOLN
1000.0000 ug | Freq: Once | INTRAMUSCULAR | Status: AC
  Administered 2016-03-31: 1000 ug via INTRAMUSCULAR

## 2016-03-31 ENCOUNTER — Ambulatory Visit (INDEPENDENT_AMBULATORY_CARE_PROVIDER_SITE_OTHER): Payer: Medicaid Other | Admitting: Family Medicine

## 2016-03-31 ENCOUNTER — Encounter: Payer: Self-pay | Admitting: Family Medicine

## 2016-03-31 VITALS — BP 146/78 | HR 63 | Temp 97.7°F | Ht 77.0 in | Wt 357.2 lb

## 2016-03-31 DIAGNOSIS — F32A Depression, unspecified: Secondary | ICD-10-CM

## 2016-03-31 DIAGNOSIS — E1142 Type 2 diabetes mellitus with diabetic polyneuropathy: Secondary | ICD-10-CM | POA: Diagnosis not present

## 2016-03-31 DIAGNOSIS — Z23 Encounter for immunization: Secondary | ICD-10-CM

## 2016-03-31 DIAGNOSIS — F329 Major depressive disorder, single episode, unspecified: Secondary | ICD-10-CM | POA: Diagnosis not present

## 2016-03-31 DIAGNOSIS — E538 Deficiency of other specified B group vitamins: Secondary | ICD-10-CM

## 2016-03-31 LAB — CMP14+EGFR
A/G RATIO: 1.3 (ref 1.2–2.2)
ALBUMIN: 4 g/dL (ref 3.5–5.5)
ALK PHOS: 82 IU/L (ref 39–117)
ALT: 32 IU/L (ref 0–44)
AST: 29 IU/L (ref 0–40)
BILIRUBIN TOTAL: 0.6 mg/dL (ref 0.0–1.2)
BUN / CREAT RATIO: 19 (ref 9–20)
BUN: 23 mg/dL (ref 6–24)
CHLORIDE: 93 mmol/L — AB (ref 96–106)
CO2: 32 mmol/L — ABNORMAL HIGH (ref 18–29)
Calcium: 9.4 mg/dL (ref 8.7–10.2)
Creatinine, Ser: 1.18 mg/dL (ref 0.76–1.27)
GFR calc non Af Amer: 69 mL/min/{1.73_m2} (ref >59)
GFR, EST AFRICAN AMERICAN: 80 mL/min/{1.73_m2} (ref >59)
GLOBULIN, TOTAL: 3 g/dL (ref 1.5–4.5)
Glucose: 232 mg/dL — ABNORMAL HIGH (ref 65–99)
POTASSIUM: 4.2 mmol/L (ref 3.5–5.2)
SODIUM: 139 mmol/L (ref 134–144)
TOTAL PROTEIN: 7 g/dL (ref 6.0–8.5)

## 2016-03-31 LAB — CBC WITH DIFFERENTIAL/PLATELET
BASOS ABS: 0.1 10*3/uL (ref 0.0–0.2)
Basos: 1 %
EOS (ABSOLUTE): 0.2 10*3/uL (ref 0.0–0.4)
Eos: 3 %
HEMATOCRIT: 41.7 % (ref 37.5–51.0)
HEMOGLOBIN: 14.5 g/dL (ref 12.6–17.7)
Immature Grans (Abs): 0 10*3/uL (ref 0.0–0.1)
Immature Granulocytes: 0 %
LYMPHS ABS: 2.4 10*3/uL (ref 0.7–3.1)
Lymphs: 27 %
MCH: 31.8 pg (ref 26.6–33.0)
MCHC: 34.8 g/dL (ref 31.5–35.7)
MCV: 91 fL (ref 79–97)
MONOCYTES: 9 %
MONOS ABS: 0.8 10*3/uL (ref 0.1–0.9)
NEUTROS ABS: 5.3 10*3/uL (ref 1.4–7.0)
Neutrophils: 60 %
Platelets: 271 10*3/uL (ref 150–379)
RBC: 4.56 x10E6/uL (ref 4.14–5.80)
RDW: 13.2 % (ref 12.3–15.4)
WBC: 8.8 10*3/uL (ref 3.4–10.8)

## 2016-03-31 LAB — BAYER DCA HB A1C WAIVED: HB A1C (BAYER DCA - WAIVED): 7.4 % — ABNORMAL HIGH (ref <7.0)

## 2016-03-31 MED ORDER — GABAPENTIN 100 MG PO CAPS: 100.0000 mg | ORAL_CAPSULE | Freq: Every day | ORAL | 3 refills | Status: DC

## 2016-03-31 MED ORDER — METFORMIN HCL 500 MG PO TABS: 500.0000 mg | ORAL_TABLET | Freq: Two times a day (BID) | ORAL | 3 refills | Status: DC

## 2016-03-31 NOTE — Patient Instructions (Signed)
Great to see you!  Start metformin 1 pill twice daily, this is for diabetes.  Start daily B-12 injections for 1 week, then weekly for 1 month, then monthly for 1 year.  Come back in 4-6 weeks to discuss depression.

## 2016-03-31 NOTE — Progress Notes (Signed)
   HPI  Patient presents today in follow-up for chronic medical conditions and several other concerns.  Type 2 diabetes Average fasting 120-150 Average postprandial 150-180 No hypoglycemia, no medications Watching his diet moderately.  No exercise due to chronic back pain.  Depression Improved, however still not feeling normal. States that he would like to try additional medications No suicidal thoughts.  Neuropathy Started gabapentin, states 300 mg at night caused approximately 18 hours of sleep, this happened with each dose that he took. States that it did help the neuropathy, however cannot tolerate that much sleep.    PMH: Smoking status noted ROS: Per HPI  Objective: BP (!) 146/78   Pulse 63   Temp 97.7 F (36.5 C) (Oral)   Ht '6\' 5"'$  (1.956 m)   Wt (!) 357 lb 3.2 oz (162 kg)   BMI 42.36 kg/m  Gen: NAD, alert, cooperative with exam HEENT: NCAT, EOMI, PERRL CV: RRR, good S1/S2, no murmur Resp: CTABL, no wheezes, non-labored Ext: No edema, warm Neuro: Alert and oriented, No gross deficits  Diabetic Foot Exam - Simple   Simple Foot Form Visual Inspection See comments:  Yes Sensation Testing See comments:  Yes Pulse Check Posterior Tibialis and Dorsalis pulse intact bilaterally:  Yes Comments Left great toe with 1 cm circular heme crusted lesion Sensation to monofilament completely absent on left, intact on right      Assessment and plan:  # Type 2 diabetes Control slipping Metformin today Consider victoza as well  # Depression Improved, consider adding abilify next visit.   # B1`2 deficicncy Started replacing today, pt will return after his cruise for daily injections. Leave tomorrow on vacation  # Diabetic neuropathy Starting lower dose of gabapentin, had hypersomnolence with 300 mg.   Morbid obesity Stable Consider voictoza Starting metformin which may make marginal improvement Continue diet, exercise limited due to pain  Return for  discussion about tramadol Rx   Orders Placed This Encounter  Procedures  . Bayer DCA Hb A1c Waived  . CMP14+EGFR  . CBC with Differential    Meds ordered this encounter  Medications  . lisinopril (PRINIVIL,ZESTRIL) 20 MG tablet    Sig: Take 20 mg by mouth daily.  . metFORMIN (GLUCOPHAGE) 500 MG tablet    Sig: Take 1 tablet (500 mg total) by mouth 2 (two) times daily with a meal.    Dispense:  180 tablet    Refill:  3  . gabapentin (NEURONTIN) 100 MG capsule    Sig: Take 1-2 capsules (100-200 mg total) by mouth at bedtime.    Dispense:  60 capsule    Refill:  Vails Gate, MD [place] 03/31/2016, 9:23 AM

## 2016-04-01 ENCOUNTER — Ambulatory Visit: Payer: Medicaid Other | Admitting: Sports Medicine

## 2016-04-08 ENCOUNTER — Ambulatory Visit (INDEPENDENT_AMBULATORY_CARE_PROVIDER_SITE_OTHER): Payer: Medicaid Other | Admitting: Sports Medicine

## 2016-04-08 ENCOUNTER — Encounter: Payer: Self-pay | Admitting: Sports Medicine

## 2016-04-08 ENCOUNTER — Other Ambulatory Visit: Payer: Self-pay | Admitting: Family Medicine

## 2016-04-08 DIAGNOSIS — L97521 Non-pressure chronic ulcer of other part of left foot limited to breakdown of skin: Secondary | ICD-10-CM

## 2016-04-08 DIAGNOSIS — E1142 Type 2 diabetes mellitus with diabetic polyneuropathy: Secondary | ICD-10-CM

## 2016-04-08 DIAGNOSIS — M21372 Foot drop, left foot: Secondary | ICD-10-CM

## 2016-04-08 DIAGNOSIS — G629 Polyneuropathy, unspecified: Secondary | ICD-10-CM

## 2016-04-08 DIAGNOSIS — M2142 Flat foot [pes planus] (acquired), left foot: Secondary | ICD-10-CM

## 2016-04-08 DIAGNOSIS — M79675 Pain in left toe(s): Secondary | ICD-10-CM

## 2016-04-08 DIAGNOSIS — M2141 Flat foot [pes planus] (acquired), right foot: Secondary | ICD-10-CM

## 2016-04-08 NOTE — Progress Notes (Addendum)
Patient ID: JAECION DEMPSTER, male   DOB: [birth year], 55 y.o.   MRN: [id number]  Subjective: NIKITAS DAVTYAN is a 55 y.o. Diabetic male patient seen in office for follow up evaluation of ulceration at left big toe, reports that he hasn't been dressing the toe and went on a trip to [place] and did some beach walking and swimming with water shoes. Denies nausea/fever/vomiting/chills/shortness of breath/pain. Patient has no other pedal complaints at this time.  FBS not recorded  Patient Active Problem List   Diagnosis Date Noted  . Depression 12/27/2015  . T2DM (type 2 diabetes mellitus) ([place]) 11/22/2015  . Delayed surgical wound healing 10/10/2015  . Healthcare maintenance 09/11/2015  . Renal mass 08/27/2015  . Right renal mass   . Edema 07/03/2015  . Abnormal MRI, lumbar spine 06/16/2015  . Radiculopathy of lumbar region 06/14/2015  . Mass of right kidney 06/13/2015  . Goiter 06/13/2015  . Proteinuria 03/27/2015  . Hyperlipidemia 03/27/2015  . Morbid obesity ([place]) 03/27/2015  . Mild dilation of ascending aorta (HCC) 06/01/2013  . Essential hypertension, benign 02/15/2013  . Precordial pain 02/15/2013  . Abnormal ECG 02/15/2013  . Mixed hyperlipidemia 02/15/2013   Current Outpatient Prescriptions on File Prior to Visit  Medication Sig Dispense Refill  . aspirin 81 MG chewable tablet Chew 81 mg by mouth daily.    . DULoxetine (CYMBALTA) 60 MG capsule Take 1 capsule (60 mg total) by mouth daily. 30 capsule 5  . furosemide (LASIX) 20 MG tablet TAKE TWO TABLETS BY MOUTH IN THE MORNING AND ONE IN THE AFTERNOON 90 tablet 2  . gabapentin (NEURONTIN) 100 MG capsule Take 1-2 capsules (100-200 mg total) by mouth at bedtime. 60 capsule 3  . hydrALAZINE (APRESOLINE) 10 MG tablet TAKE ONE TABLET BY MOUTH THREE TIMES DAILY 90 tablet 2  . hydrochlorothiazide (HYDRODIURIL) 25 MG tablet TAKE ONE TABLET BY MOUTH ONCE DAILY 30 tablet 5  . lisinopril (PRINIVIL,ZESTRIL) 20 MG tablet Take 20 mg by mouth  daily.    . metFORMIN (GLUCOPHAGE) 500 MG tablet Take 1 tablet (500 mg total) by mouth 2 (two) times daily with a meal. 180 tablet 3  . metoprolol (LOPRESSOR) 100 MG tablet TAKE TWO TABLETS BY MOUTH TWICE DAILY 120 tablet 5  . Misc. Devices (HUGO ROLLING WALKER PREMIUM) MISC 1 Units by Does not apply route once. 1 each 0  . mupirocin ointment (BACTROBAN) 2 % Apply to affected area daily. 30 g 0  . oxyCODONE-acetaminophen (PERCOCET) 10-325 MG tablet Take 1 tablet by mouth every 6 (six) hours as needed for pain.    . traMADol (ULTRAM) 50 MG tablet Take 1 tablet (50 mg total) by mouth every 8 (eight) hours as needed. 60 tablet 0   No current facility-administered medications on file prior to visit.    Allergies  Allergen Reactions  . Diltiazem Swelling    Wt gain,swelling hands,feet,gum bleeding  . Clonidine Derivatives Swelling  . Procardia [Nifedipine] Swelling    Swelling on feet and legs     Recent Results (from the past 2160 hour(s))  Copper, serum     Status: None   Collection Time: 03/17/16 12:00 AM  Result Value Ref Range   Copper 118 72 - 166 ug/dL    Comment:                                 Detection Limit = 5  Vitamin B12  Status: Abnormal   Collection Time: 03/17/16 11:05 AM  Result Value Ref Range   Vitamin B-12 192 (L) 211 - 911 pg/mL  Vitamin B1     Status: None   Collection Time: 03/17/16 11:05 AM  Result Value Ref Range   Vitamin B1 (Thiamine) 9 8 - 30 nmol/L    Comment: Vitamin supplementation within 24 hours prior to blood draw may affect the accuracy of the results. This test was developed and its analytical performance characteristics have been determined by [place], [place]. It has not been cleared or approved by the U.S. Food and Drug Administration. This assay has been validated pursuant to the CLIA regulations and is used for clinical purposes.   Protein Electrophoresis, (serum)     Status: None   Collection Time:  03/17/16 11:05 AM  Result Value Ref Range   Total Protein, Serum Electrophoresis 7.3 6.1 - 8.1 g/dL   Albumin ELP 4.1 3.8 - 4.8 g/dL   Alpha-1-Globulin 0.3 0.2 - 0.3 g/dL   Alpha-2-Globulin 0.8 0.5 - 0.9 g/dL   Beta Globulin 0.5 0.4 - 0.6 g/dL   Beta 2 0.5 0.2 - 0.5 g/dL   Gamma Globulin 1.2 0.8 - 1.7 g/dL   Abnormal Protein Band1 NOT DET g/dL   SPE Interp. SEE NOTE     Comment: Normal Electrophoretic Pattern Reviewed by Odis Hollingshead, MD, PhD, FCAP (Electronic Signature on File)    Abnormal Protein Band2 NOT DET g/dL   Abnormal Protein Band3 NOT DET g/dL  Immunofixation electrophoresis     Status: None   Collection Time: 03/17/16 11:05 AM  Result Value Ref Range   IgG (Immunoglobin G), Serum 1,180 694 - 1,618 mg/dL   IgA 368 81 - 463 mg/dL   IgM, Serum 97 48 - 271 mg/dL   Immunofix Electr Int SEE NOTE     Comment: Normal pattern. No monoclonal proteins detected. Reviewed by Odis Hollingshead, MD, PhD, FCAP (Electronic Signature on File)   Bayer [facility] Hb A1c Waived     Status: Abnormal   Collection Time: 03/31/16  8:42 AM  Result Value Ref Range   Bayer DCA Hb A1c Waived 7.4 (H) <7.0 %    Comment:                                       Diabetic Adult            <7.0                                       Healthy Adult        4.3 - 5.7                                                           (DCCT/NGSP) American Diabetes Association's Summary of Glycemic Recommendations for Adults with Diabetes: Hemoglobin A1c <7.0%. More stringent glycemic goals (A1c <6.0%) may further reduce complications at the cost of increased risk of hypoglycemia.   CMP14+EGFR     Status: Abnormal   Collection Time: 03/31/16  9:26 AM  Result Value Ref Range   Glucose  232 (H) 65 - 99 mg/dL   BUN 23 6 - 24 mg/dL   Creatinine, Ser 1.18 0.76 - 1.27 mg/dL   GFR calc non Af Amer 69 >59 mL/min/1.73   GFR calc Af Amer 80 >59 mL/min/1.73   BUN/Creatinine Ratio 19 9 - 20   Sodium 139 134 - 144 mmol/L    Potassium 4.2 3.5 - 5.2 mmol/L   Chloride 93 (L) 96 - 106 mmol/L   CO2 32 (H) 18 - 29 mmol/L   Calcium 9.4 8.7 - 10.2 mg/dL   Total Protein 7.0 6.0 - 8.5 g/dL   Albumin 4.0 3.5 - 5.5 g/dL   Globulin, Total 3.0 1.5 - 4.5 g/dL   Albumin/Globulin Ratio 1.3 1.2 - 2.2   Bilirubin Total 0.6 0.0 - 1.2 mg/dL   Alkaline Phosphatase 82 39 - 117 IU/L   AST 29 0 - 40 IU/L   ALT 32 0 - 44 IU/L  CBC with Differential     Status: None   Collection Time: 03/31/16  9:26 AM  Result Value Ref Range   WBC 8.8 3.4 - 10.8 x10E3/uL   RBC 4.56 4.14 - 5.80 x10E6/uL   Hemoglobin 14.5 12.6 - 17.7 g/dL   Hematocrit 41.7 37.5 - 51.0 %   MCV 91 79 - 97 fL   MCH 31.8 26.6 - 33.0 pg   MCHC 34.8 31.5 - 35.7 g/dL   RDW 13.2 12.3 - 15.4 %   Platelets 271 150 - 379 x10E3/uL   Neutrophils 60 Not Estab. %   Lymphs 27 Not Estab. %   Monocytes 9 Not Estab. %   Eos 3 Not Estab. %   Basos 1 Not Estab. %   Neutrophils Absolute 5.3 1.4 - 7.0 x10E3/uL   Lymphocytes Absolute 2.4 0.7 - 3.1 x10E3/uL   Monocytes Absolute 0.8 0.1 - 0.9 x10E3/uL   EOS (ABSOLUTE) 0.2 0.0 - 0.4 x10E3/uL   Basophils Absolute 0.1 0.0 - 0.2 x10E3/uL   Immature Granulocytes 0 Not Estab. %   Immature Grans (Abs) 0.0 0.0 - 0.1 x10E3/uL    Objective: There were no vitals filed for this visit.  General: Patient is awake, alert, oriented x 3 and in no acute distress.  Dermatology: Skin is warm and dry bilateral with a partial thickness ulceration at the plantar Medial aspect of the left hallux with mild reactive keratosis and a underlying opening that measures 0.3x0.4x0.1cm (last measurement 0.2x0.1x0.1cm), no malodor, no active drainage, no erythema, no edema. No acute signs of infection.   Vascular: Dorsalis Pedis pulse = 2/4 Bilateral,  Posterior Tibial pulse = 1/4 Bilateral,  Capillary Fill Time < 5 seconds  Neurologic: Protective sensation intact on right and diminished on left. Sharp pain not relieved by Cymbalta, bilateral consistent with  neuropathic pain, unchanged from prior.  Musculosketal: No pain to palpation to ulcerated area at left medial hallux. No pain with compression to calves bilateral. Pes planus deformities noted bilateral. Left drop foot hx of sciatica and nerve damage.   Assessment and Plan:  Problem List Items Addressed This Visit    None    Visit Diagnoses    Toe ulcer, left, limited to breakdown of skin (HCC)    -  Primary   Neuropathy (HCC)       Toe pain, left       Pes planus of both feet       Foot drop, left       Diabetic polyneuropathy associated with type 2 diabetes mellitus ([place])          -  Examined patient and discussed continued care for ulceration at Left hallux and treatment alternatives. - Debrided ulceration at left hallux to healthy bleeding borders applied Oasis 3x7cm trilayer wound matrix (lot [id number]) used 50% of the graft secured steristrips covered with well padded guaze and coban. Patient to continue with outer dressing changes as needed. -Continue with felt offloading pad to left insert -Continue with accommodative orthotics and AFO brace for left from [facility] recommend to take back for refurbishing since brace is cracking. May consider a different type of walking boot/brace - Advised patient to go to the ER or return to office if the area worsens or if constitutional symptoms are present. -Awaiting Neuro consult since patient still has pain with Cymbalta  -Patient to return to office in 2weeks for follow up care and evaluation or sooner if problems arise. If no healing will consider Wound culture, x-rays and OR graft.   Landis Martins, DPM

## 2016-04-29 ENCOUNTER — Encounter: Payer: Self-pay | Admitting: Sports Medicine

## 2016-04-29 ENCOUNTER — Other Ambulatory Visit: Payer: Self-pay | Admitting: Neurological Surgery

## 2016-04-29 ENCOUNTER — Ambulatory Visit (INDEPENDENT_AMBULATORY_CARE_PROVIDER_SITE_OTHER): Payer: Medicaid Other | Admitting: Sports Medicine

## 2016-04-29 ENCOUNTER — Other Ambulatory Visit (HOSPITAL_COMMUNITY): Payer: Self-pay | Admitting: Neurological Surgery

## 2016-04-29 DIAGNOSIS — G629 Polyneuropathy, unspecified: Secondary | ICD-10-CM

## 2016-04-29 DIAGNOSIS — M79675 Pain in left toe(s): Secondary | ICD-10-CM

## 2016-04-29 DIAGNOSIS — L97521 Non-pressure chronic ulcer of other part of left foot limited to breakdown of skin: Secondary | ICD-10-CM | POA: Diagnosis not present

## 2016-04-29 DIAGNOSIS — M5127 Other intervertebral disc displacement, lumbosacral region: Secondary | ICD-10-CM

## 2016-04-29 NOTE — Progress Notes (Signed)
Patient ID: Anthony Logan, male   DOB: [birth year], 55 y.o.   MRN: [id number]  Subjective: Anthony Logan is a 55 y.o. Diabetic male patient seen in office for follow up evaluation of ulceration at left big toe, reports that he has been changing outer dressing layers. Denies nausea/fever/vomiting/chills/shortness of breath/pain. Patient has no other pedal complaints at this time.  FBS not recorded  Patient Active Problem List   Diagnosis Date Noted  . Depression 12/27/2015  . T2DM (type 2 diabetes mellitus) ([place]) 11/22/2015  . Delayed surgical wound healing 10/10/2015  . Healthcare maintenance 09/11/2015  . Renal mass 08/27/2015  . Right renal mass   . Edema 07/03/2015  . Abnormal MRI, lumbar spine 06/16/2015  . Radiculopathy of lumbar region 06/14/2015  . Mass of right kidney 06/13/2015  . Goiter 06/13/2015  . Proteinuria 03/27/2015  . Hyperlipidemia 03/27/2015  . Morbid obesity ([place]) 03/27/2015  . Mild dilation of ascending aorta (HCC) 06/01/2013  . Essential hypertension, benign 02/15/2013  . Precordial pain 02/15/2013  . Abnormal ECG 02/15/2013  . Mixed hyperlipidemia 02/15/2013   Current Outpatient Prescriptions on File Prior to Visit  Medication Sig Dispense Refill  . aspirin 81 MG chewable tablet Chew 81 mg by mouth daily.    . DULoxetine (CYMBALTA) 60 MG capsule Take 1 capsule (60 mg total) by mouth daily. 30 capsule 5  . furosemide (LASIX) 20 MG tablet TAKE TWO TABLETS BY MOUTH IN THE MORNING AND ONE IN THE AFTERNOON 90 tablet 2  . gabapentin (NEURONTIN) 100 MG capsule Take 1-2 capsules (100-200 mg total) by mouth at bedtime. 60 capsule 3  . hydrALAZINE (APRESOLINE) 10 MG tablet TAKE ONE TABLET BY MOUTH THREE TIMES DAILY 90 tablet 5  . hydrochlorothiazide (HYDRODIURIL) 25 MG tablet TAKE ONE TABLET BY MOUTH ONCE DAILY 30 tablet 5  . lisinopril (PRINIVIL,ZESTRIL) 20 MG tablet Take 20 mg by mouth daily.    . metFORMIN (GLUCOPHAGE) 500 MG tablet Take 1 tablet (500 mg  total) by mouth 2 (two) times daily with a meal. 180 tablet 3  . metoprolol (LOPRESSOR) 100 MG tablet TAKE TWO TABLETS BY MOUTH TWICE DAILY 120 tablet 5  . Misc. Devices (HUGO ROLLING WALKER PREMIUM) MISC 1 Units by Does not apply route once. 1 each 0  . mupirocin ointment (BACTROBAN) 2 % Apply to affected area daily. 30 g 0  . oxyCODONE-acetaminophen (PERCOCET) 10-325 MG tablet Take 1 tablet by mouth every 6 (six) hours as needed for pain.    . traMADol (ULTRAM) 50 MG tablet Take 1 tablet (50 mg total) by mouth every 8 (eight) hours as needed. 60 tablet 0   No current facility-administered medications on file prior to visit.    Allergies  Allergen Reactions  . Diltiazem Swelling    Wt gain,swelling hands,feet,gum bleeding  . Clonidine Derivatives Swelling  . Procardia [Nifedipine] Swelling    Swelling on feet and legs     Recent Results (from the past 2160 hour(s))  Copper, serum     Status: None   Collection Time: 03/17/16 12:00 AM  Result Value Ref Range   Copper 118 72 - 166 ug/dL    Comment:                                 Detection Limit = 5  Vitamin B12     Status: Abnormal   Collection Time: 03/17/16 11:05 AM  Result Value  Ref Range   Vitamin B-12 192 (L) 211 - 911 pg/mL  Vitamin B1     Status: None   Collection Time: 03/17/16 11:05 AM  Result Value Ref Range   Vitamin B1 (Thiamine) 9 8 - 30 nmol/L    Comment: Vitamin supplementation within 24 hours prior to blood draw may affect the accuracy of the results. This test was developed and its analytical performance characteristics have been determined by [place], [place]. It has not been cleared or approved by the U.S. Food and Drug Administration. This assay has been validated pursuant to the CLIA regulations and is used for clinical purposes.   Protein Electrophoresis, (serum)     Status: None   Collection Time: 03/17/16 11:05 AM  Result Value Ref Range   Total Protein, Serum  Electrophoresis 7.3 6.1 - 8.1 g/dL   Albumin ELP 4.1 3.8 - 4.8 g/dL   Alpha-1-Globulin 0.3 0.2 - 0.3 g/dL   Alpha-2-Globulin 0.8 0.5 - 0.9 g/dL   Beta Globulin 0.5 0.4 - 0.6 g/dL   Beta 2 0.5 0.2 - 0.5 g/dL   Gamma Globulin 1.2 0.8 - 1.7 g/dL   Abnormal Protein Band1 NOT DET g/dL   SPE Interp. SEE NOTE     Comment: Normal Electrophoretic Pattern Reviewed by Odis Hollingshead, MD, PhD, FCAP (Electronic Signature on File)    Abnormal Protein Band2 NOT DET g/dL   Abnormal Protein Band3 NOT DET g/dL  Immunofixation electrophoresis     Status: None   Collection Time: 03/17/16 11:05 AM  Result Value Ref Range   IgG (Immunoglobin G), Serum 1,180 694 - 1,618 mg/dL   IgA 368 81 - 463 mg/dL   IgM, Serum 97 48 - 271 mg/dL   Immunofix Electr Int SEE NOTE     Comment: Normal pattern. No monoclonal proteins detected. Reviewed by Odis Hollingshead, MD, PhD, FCAP (Electronic Signature on File)   Bayer [facility] Hb A1c Waived     Status: Abnormal   Collection Time: 03/31/16  8:42 AM  Result Value Ref Range   Bayer DCA Hb A1c Waived 7.4 (H) <7.0 %    Comment:                                       Diabetic Adult            <7.0                                       Healthy Adult        4.3 - 5.7                                                           (DCCT/NGSP) American Diabetes Association's Summary of Glycemic Recommendations for Adults with Diabetes: Hemoglobin A1c <7.0%. More stringent glycemic goals (A1c <6.0%) may further reduce complications at the cost of increased risk of hypoglycemia.   CMP14+EGFR     Status: Abnormal   Collection Time: 03/31/16  9:26 AM  Result Value Ref Range   Glucose 232 (H) 65 - 99 mg/dL   BUN 23 6 -  24 mg/dL   Creatinine, Ser 1.18 0.76 - 1.27 mg/dL   GFR calc non Af Amer 69 >59 mL/min/1.73   GFR calc Af Amer 80 >59 mL/min/1.73   BUN/Creatinine Ratio 19 9 - 20   Sodium 139 134 - 144 mmol/L   Potassium 4.2 3.5 - 5.2 mmol/L   Chloride 93 (L) 96 - 106 mmol/L    CO2 32 (H) 18 - 29 mmol/L   Calcium 9.4 8.7 - 10.2 mg/dL   Total Protein 7.0 6.0 - 8.5 g/dL   Albumin 4.0 3.5 - 5.5 g/dL   Globulin, Total 3.0 1.5 - 4.5 g/dL   Albumin/Globulin Ratio 1.3 1.2 - 2.2   Bilirubin Total 0.6 0.0 - 1.2 mg/dL   Alkaline Phosphatase 82 39 - 117 IU/L   AST 29 0 - 40 IU/L   ALT 32 0 - 44 IU/L  CBC with Differential     Status: None   Collection Time: 03/31/16  9:26 AM  Result Value Ref Range   WBC 8.8 3.4 - 10.8 x10E3/uL   RBC 4.56 4.14 - 5.80 x10E6/uL   Hemoglobin 14.5 12.6 - 17.7 g/dL   Hematocrit 41.7 37.5 - 51.0 %   MCV 91 79 - 97 fL   MCH 31.8 26.6 - 33.0 pg   MCHC 34.8 31.5 - 35.7 g/dL   RDW 13.2 12.3 - 15.4 %   Platelets 271 150 - 379 x10E3/uL   Neutrophils 60 Not Estab. %   Lymphs 27 Not Estab. %   Monocytes 9 Not Estab. %   Eos 3 Not Estab. %   Basos 1 Not Estab. %   Neutrophils Absolute 5.3 1.4 - 7.0 x10E3/uL   Lymphocytes Absolute 2.4 0.7 - 3.1 x10E3/uL   Monocytes Absolute 0.8 0.1 - 0.9 x10E3/uL   EOS (ABSOLUTE) 0.2 0.0 - 0.4 x10E3/uL   Basophils Absolute 0.1 0.0 - 0.2 x10E3/uL   Immature Granulocytes 0 Not Estab. %   Immature Grans (Abs) 0.0 0.0 - 0.1 x10E3/uL    Objective: There were no vitals filed for this visit.  General: Patient is awake, alert, oriented x 3 and in no acute distress.  Dermatology: Skin is warm and dry bilateral with a partial thickness ulceration at the plantar Medial aspect of the left hallux with mild reactive keratosis and a underlying opening that measures 0.3x0.3x0.1cm (last measurement 0.3x0.4x0.1cm), no malodor, no active drainage, no erythema, no edema. No acute signs of infection.   Vascular: Dorsalis Pedis pulse = 2/4 Bilateral,  Posterior Tibial pulse = 1/4 Bilateral,  Capillary Fill Time < 5 seconds  Neurologic: Protective sensation intact on right and diminished on left. Sharp pain not relieved by Cymbalta, bilateral consistent with neuropathic pain, unchanged from prior.  Musculosketal: No pain  to palpation to ulcerated area at left medial hallux. No pain with compression to calves bilateral. Pes planus deformities noted bilateral. Left drop foot hx of sciatica and nerve damage.   Assessment and Plan:  Problem List Items Addressed This Visit    None    Visit Diagnoses    Toe ulcer, left, limited to breakdown of skin (HCC)    -  Primary   Neuropathy ([place])       Toe pain, left          -Examined patient and discussed continued care for ulceration at Left hallux and treatment alternatives. - Debrided ulceration at left hallux to healthy bleeding borders and applied Oasis 3x7cm trilayer wound matrix (lot [id number]) used 50% of the graft  secured steristrips covered with well padded guaze and coban. Patient to continue with outer dressing changes as needed. -Continue with accommodative orthotics and AFO brace for left from [facility] which was replaced and modified by Liliane Channel at [place] patient to go to the ER or return to office if the area worsens or if constitutional symptoms are present. -Awaiting Neuro consult since patient still has pain with Cymbalta; states he got a letter to make an appointment and will do so -Patient to return to office in 2-3 weeks for follow up care and evaluation or sooner if problems arise. If no healing will consider repeat wound culture, x-rays and OR graft.   Landis Martins, DPM

## 2016-04-30 ENCOUNTER — Encounter: Payer: Self-pay | Admitting: Sports Medicine

## 2016-05-01 ENCOUNTER — Ambulatory Visit (INDEPENDENT_AMBULATORY_CARE_PROVIDER_SITE_OTHER): Payer: Medicaid Other | Admitting: Family Medicine

## 2016-05-01 ENCOUNTER — Encounter: Payer: Self-pay | Admitting: Family Medicine

## 2016-05-01 VITALS — BP 131/75 | HR 55 | Temp 97.8°F | Ht 77.0 in | Wt 353.4 lb

## 2016-05-01 DIAGNOSIS — F32A Depression, unspecified: Secondary | ICD-10-CM

## 2016-05-01 DIAGNOSIS — I10 Essential (primary) hypertension: Secondary | ICD-10-CM | POA: Diagnosis not present

## 2016-05-01 DIAGNOSIS — F329 Major depressive disorder, single episode, unspecified: Secondary | ICD-10-CM | POA: Diagnosis not present

## 2016-05-01 DIAGNOSIS — E1142 Type 2 diabetes mellitus with diabetic polyneuropathy: Secondary | ICD-10-CM

## 2016-05-01 MED ORDER — BUPROPION HCL ER (SR) 150 MG PO TB12: 150.0000 mg | ORAL_TABLET | Freq: Two times a day (BID) | ORAL | 3 refills | Status: DC

## 2016-05-01 MED ORDER — DULOXETINE HCL 30 MG PO CPEP: 30.0000 mg | ORAL_CAPSULE | Freq: Every day | ORAL | 0 refills | Status: DC

## 2016-05-01 NOTE — Patient Instructions (Signed)
Great to see you!   To stop Cymbalta: Take 30 mg capsule ( new Rx at the pharm) for 2 weeks then stop  Start wellbutrin: 1 pill daily for 3 days then increase to 1 pill twice daily.   Come back in 1 month to see how things are going.

## 2016-05-01 NOTE — Progress Notes (Signed)
   HPI  Patient presents today to follow-up for her diabetes, hypertension, and depression.  Depression Patient states that he can't tell difference with Cymbalta, he's been on them for 4 months, he does not report any improvement in pain. He states he would like to try additional medications or transition to a different medication as he is that he is on plenty of medications already. He denies any suicidal thoughts, he denies severe anxiety and states that most of his feelings are depressed and down. Patient thinks that he may have gotten worse after starting metformin, as it pertains to his mood.  Metformin- type 2 diabetes Tolerating moderately, has had some GI upset and some loose stools, however it seems to be improving. Good compliance, does not want to change yet.  Hypertension Good medication once, no side effects Average [id number] systolic at home, only checks occasionally.  PMH: Smoking status noted ROS: Per HPI  Objective: BP 131/75   Pulse (!) 55   Temp 97.8 F (36.6 C) (Oral)   Ht 6\' 5"  (1.956 m)   Wt (!) 353 lb 6.4 oz (160.3 kg)   BMI 41.91 kg/m  Gen: NAD, alert, cooperative with exam HEENT: NCAT, EOMI, PERRL CV: RRR, good S1/S2, no murmur Resp: CTABL, no wheezes, non-labored Abd: SNTND, BS present, no guarding or organomegaly Ext: No edema, warm Neuro: Alert and oriented, No gross deficits  Depression screen [facility] 2/9 05/01/2016 03/31/2016 12/27/2015 11/22/2015 10/10/2015  Decreased Interest 3 0 0 3 0  Down, Depressed, Hopeless 2 0 3 2 0  PHQ - 2 Score 5 0 3 5 0  Altered sleeping 3 - 3 2 -  Tired, decreased energy 3 - 3 3 -  Change in appetite 3 - 2 2 -  Feeling bad or failure about yourself  2 - 3 3 -  Trouble concentrating 3 - 3 3 -  Moving slowly or fidgety/restless 0 - 2 2 -  Suicidal thoughts 0 - 1 0 -  PHQ-9 Score 19 - 20 20 -  Difficult doing work/chores - - Not difficult at all - -     Assessment and plan:  # Depression Transition from Cymbalta to  Wellbutrin Ideally I would like to try him on trulicity Denies suicidal thoughts Discussed titrating down Cymbalta 2 weeks and titrating up Wellbutrin Likely the metformin is actually working against his mood  # T2 diabetes Tolerating metformin moderately, offered changing today, however he would like to continue for now  # Hypertension Well-controlled on current medications including metoprolol, lisinopril, HCTZ, no changes Also on Lasix   Meds ordered this encounter  Medications  . DULoxetine (CYMBALTA) 30 MG capsule    Sig: Take 1 capsule (30 mg total) by mouth daily.    Dispense:  14 capsule    Refill:  0  . buPROPion (WELLBUTRIN SR) 150 MG 12 hr tablet    Sig: Take 1 tablet (150 mg total) by mouth 2 (two) times daily.    Dispense:  60 tablet    Refill:  [place], MD [place] 05/01/2016, 9:20 AM

## 2016-05-06 ENCOUNTER — Ambulatory Visit (HOSPITAL_COMMUNITY)
Admission: RE | Admit: 2016-05-06 | Discharge: 2016-05-06 | Disposition: A | Discharge status: Home / Self Care | Payer: Medicaid Other | Source: Ambulatory Visit | Attending: Neurological Surgery | Admitting: Neurological Surgery

## 2016-05-06 DIAGNOSIS — M5126 Other intervertebral disc displacement, lumbar region: Secondary | ICD-10-CM | POA: Insufficient documentation

## 2016-05-06 DIAGNOSIS — M2578 Osteophyte, vertebrae: Secondary | ICD-10-CM | POA: Insufficient documentation

## 2016-05-06 DIAGNOSIS — M5127 Other intervertebral disc displacement, lumbosacral region: Secondary | ICD-10-CM | POA: Diagnosis not present

## 2016-05-20 ENCOUNTER — Ambulatory Visit: Payer: Medicaid Other | Admitting: Sports Medicine

## 2016-06-02 ENCOUNTER — Ambulatory Visit (INDEPENDENT_AMBULATORY_CARE_PROVIDER_SITE_OTHER): Payer: Medicaid Other | Admitting: Family Medicine

## 2016-06-02 ENCOUNTER — Other Ambulatory Visit: Payer: Self-pay | Admitting: Family Medicine

## 2016-06-02 ENCOUNTER — Encounter: Payer: Self-pay | Admitting: Family Medicine

## 2016-06-02 VITALS — BP 144/83 | HR 59 | Temp 97.3°F | Ht 77.0 in | Wt 360.0 lb

## 2016-06-02 DIAGNOSIS — F32A Depression, unspecified: Secondary | ICD-10-CM

## 2016-06-02 DIAGNOSIS — R0982 Postnasal drip: Secondary | ICD-10-CM | POA: Diagnosis not present

## 2016-06-02 DIAGNOSIS — F329 Major depressive disorder, single episode, unspecified: Secondary | ICD-10-CM

## 2016-06-02 MED ORDER — BUPROPION HCL ER (XL) 300 MG PO TB24: 300.0000 mg | ORAL_TABLET | Freq: Every day | ORAL | 5 refills | Status: DC

## 2016-06-02 NOTE — Progress Notes (Signed)
   HPI  Patient presents today to follow-up for depression, morbid obesity, and new complaint of postnasal drip.  Depression Patient transitioning from Cymbalta to Wellbutrin. He states that he is now on Wellbutrin 1 tablet twice daily and feels very good. He denies any suicidal thoughts and states that his depressions improved drastically.  Obesity Patient has joined [employer] and he is learning how to watch his diet with a point system.  Postnasal drip Patient complaining of nasal congestion, sore throat in the morning, and frequent throat clearing for a couple of days. Course seems helping, ask for more recommendations.  Patient is going for Hightened emotional time as his father just died.  PMH: Smoking status noted ROS: Per HPI  Objective: BP (!) 144/83   Pulse (!) 59   Temp 97.3 F (36.3 C) (Oral)   Ht 6\' 5"  (1.956 m)   Wt (!) 360 lb (163.3 kg)   BMI 42.69 kg/m  Gen: NAD, alert, cooperative with exam HEENT: NCAT CV: RRR, good S1/S2, no murmur Resp: CTABL, no wheezes, non-labored Ext: No edema, warm Neuro: Alert and oriented, No gross deficits  Assessment and plan:  # Depression Doing well with wellbutrin, change to XL/once daily  # morbid obesity worening currently, however pt with good hope for improvement on weight watchers now.  COnsider GLP with Dm2 not at goal and now off of metformin due to intolerance.   # Post nasal drip Recommended flonase or nasocort   Meds ordered this encounter  Medications  . buPROPion (WELLBUTRIN XL) 300 MG 24 hr tablet    Sig: Take 1 tablet (300 mg total) by mouth daily.    Dispense:  30 tablet    Refill:  [place], MD [place] 06/02/2016, 10:43 AM

## 2016-06-02 NOTE — Patient Instructions (Signed)
Great to see you!  Try flonase or nasocort for your nose and post nasal drip.   I have sent Wellbutrin XL which will be 1 pill once a day instead of 1 pill twice daily.   Come back in 1 month to discuss starting a new diabetic medication with the clinical pharmacist.  We are considering Trulicity or Victoza.

## 2016-06-09 ENCOUNTER — Ambulatory Visit (HOSPITAL_COMMUNITY)
Admission: RE | Admit: 2016-06-09 | Discharge: 2016-06-09 | Disposition: A | Discharge status: Home / Self Care | Payer: Medicaid Other | Source: Ambulatory Visit | Attending: Urology | Admitting: Urology

## 2016-06-09 ENCOUNTER — Other Ambulatory Visit: Payer: Self-pay | Admitting: Urology

## 2016-06-09 DIAGNOSIS — C641 Malignant neoplasm of right kidney, except renal pelvis: Secondary | ICD-10-CM | POA: Insufficient documentation

## 2016-06-10 ENCOUNTER — Encounter: Payer: Self-pay | Admitting: Sports Medicine

## 2016-06-10 ENCOUNTER — Ambulatory Visit (INDEPENDENT_AMBULATORY_CARE_PROVIDER_SITE_OTHER): Payer: Medicaid Other | Admitting: Sports Medicine

## 2016-06-10 DIAGNOSIS — M79675 Pain in left toe(s): Secondary | ICD-10-CM

## 2016-06-10 DIAGNOSIS — G629 Polyneuropathy, unspecified: Secondary | ICD-10-CM

## 2016-06-10 DIAGNOSIS — Z01818 Encounter for other preprocedural examination: Secondary | ICD-10-CM

## 2016-06-10 DIAGNOSIS — L97521 Non-pressure chronic ulcer of other part of left foot limited to breakdown of skin: Secondary | ICD-10-CM

## 2016-06-10 NOTE — Patient Instructions (Signed)
Pre-Operative Instructions  Congratulations, you have decided to take an important step to improving your quality of life.  You can be assured that the doctors of [facility] will be with you every step of the way.  1. Plan to be at the surgery center/hospital at least 1 (one) hour prior to your scheduled time unless otherwise directed by the surgical center/hospital staff.  You must have a responsible adult accompany you, remain during the surgery and drive you home.  Make sure you have directions to the surgical center/hospital and know how to get there on time. 2. For hospital based surgery you will need to obtain a history and physical form from your family physician within 1 month prior to the date of surgery- we will give you a form for you primary physician.  3. We make every effort to accommodate the date you request for surgery.  There are however, times where surgery dates or times have to be moved.  We will contact you as soon as possible if a change in schedule is required.   4. No Aspirin/Ibuprofen for one week before surgery.  If you are on aspirin, any non-steroidal anti-inflammatory medications (Mobic, Aleve, Ibuprofen) you should stop taking it 7 days prior to your surgery.  You make take Tylenol  For pain prior to surgery.  5. Medications- If you are taking daily heart and blood pressure medications, seizure, reflux, allergy, asthma, anxiety, pain or diabetes medications, make sure the surgery center/hospital is aware before the day of surgery so they may notify you which medications to take or avoid the day of surgery. 6. No food or drink after midnight the night before surgery unless directed otherwise by surgical center/hospital staff. 7. No alcoholic beverages 24 hours prior to surgery.  No smoking 24 hours prior to or 24 hours after surgery. 8. Wear loose pants or shorts- loose enough to fit over bandages, boots, and casts. 9. No slip on shoes, sneakers are best. 10. Bring  your boot with you to the surgery center/hospital.  Also bring crutches or a walker if your physician has prescribed it for you.  If you do not have this equipment, it will be provided for you after surgery. 11. If you have not been contracted by the surgery center/hospital by the day before your surgery, call to confirm the date and time of your surgery. 12. Leave-time from work may vary depending on the type of surgery you have.  Appropriate arrangements should be made prior to surgery with your employer. 13. Prescriptions will be provided immediately following surgery by your doctor.  Have these filled as soon as possible after surgery and take the medication as directed. 14. Remove nail polish on the operative foot. 15. Wash the night before surgery.  The night before surgery wash the foot and leg well with the antibacterial soap provided and water paying special attention to beneath the toenails and in between the toes.  Rinse thoroughly with water and dry well with a towel.  Perform this wash unless told not to do so by your physician.  Enclosed: 1 Ice pack (please put in freezer the night before surgery)   1 Hibiclens skin cleaner   Pre-op Instructions  If you have any questions regarding the instructions, do not hesitate to call our office.  [place]: [address] [place], [place] [address] [phone]  [place]: [address] [place], [place] [address] [phone]  [place]: [address]  [place], [place] [address] [phone]   Dr.   Norman Regal DPM, Dr. Matthew Wagoner DPM, Dr. M. Todd Hyatt DPM, Dr. Asmi Fugere DPM 

## 2016-06-10 NOTE — Progress Notes (Signed)
Patient ID: Anthony Logan, male   DOB: [birth year], 56 y.o.   MRN: [id number]  Subjective: Anthony Logan is a 56 y.o. Diabetic male patient seen in office for follow up evaluation of ulceration at left big toe, reports that he has been changing outer dressing layers. Denies nausea/fever/vomiting/chills/shortness of breath/pain. Patient states that he want to try other options to heal wound; has no other pedal complaints at this time.  FBS not recorded  Patient Active Problem List   Diagnosis Date Noted  . Depression 12/27/2015  . T2DM (type 2 diabetes mellitus) ([place]) 11/22/2015  . Delayed surgical wound healing 10/10/2015  . Healthcare maintenance 09/11/2015  . Renal mass 08/27/2015  . Right renal mass   . Edema 07/03/2015  . Abnormal MRI, lumbar spine 06/16/2015  . Radiculopathy of lumbar region 06/14/2015  . Mass of right kidney 06/13/2015  . Goiter 06/13/2015  . Proteinuria 03/27/2015  . Hyperlipidemia 03/27/2015  . Morbid obesity ([place]) 03/27/2015  . Mild dilation of ascending aorta (HCC) 06/01/2013  . Essential hypertension, benign 02/15/2013  . Precordial pain 02/15/2013  . Abnormal ECG 02/15/2013  . Mixed hyperlipidemia 02/15/2013   Current Outpatient Prescriptions on File Prior to Visit  Medication Sig Dispense Refill  . aspirin 81 MG chewable tablet Chew 81 mg by mouth daily.    Marland Kitchen buPROPion (WELLBUTRIN XL) 300 MG 24 hr tablet Take 1 tablet (300 mg total) by mouth daily. 30 tablet 5  . furosemide (LASIX) 20 MG tablet TAKE TWO TABLETS BY MOUTH IN THE MORNING AND ONE IN THE AFTERNOON 270 tablet 0  . gabapentin (NEURONTIN) 100 MG capsule Take 1-2 capsules (100-200 mg total) by mouth at bedtime. 60 capsule 3  . hydrALAZINE (APRESOLINE) 10 MG tablet TAKE ONE TABLET BY MOUTH THREE TIMES DAILY 90 tablet 5  . hydrochlorothiazide (HYDRODIURIL) 25 MG tablet TAKE ONE TABLET BY MOUTH ONCE DAILY 30 tablet 5  . lisinopril (PRINIVIL,ZESTRIL) 20 MG tablet Take 20 mg by mouth daily.     . metoprolol (LOPRESSOR) 100 MG tablet TAKE TWO TABLETS BY MOUTH TWICE DAILY 360 tablet 0  . Misc. Devices (HUGO ROLLING WALKER PREMIUM) MISC 1 Units by Does not apply route once. 1 each 0   No current facility-administered medications on file prior to visit.    Allergies  Allergen Reactions  . Diltiazem Swelling    Wt gain,swelling hands,feet,gum bleeding  . Clonidine Derivatives Swelling  . Procardia [Nifedipine] Swelling    Swelling on feet and legs     Recent Results (from the past 2160 hour(s))  Copper, serum     Status: None   Collection Time: 03/17/16 12:00 AM  Result Value Ref Range   Copper 118 72 - 166 ug/dL    Comment:                                 Detection Limit = 5  Vitamin B12     Status: Abnormal   Collection Time: 03/17/16 11:05 AM  Result Value Ref Range   Vitamin B-12 192 (L) 211 - 911 pg/mL  Vitamin B1     Status: None   Collection Time: 03/17/16 11:05 AM  Result Value Ref Range   Vitamin B1 (Thiamine) 9 8 - 30 nmol/L    Comment: Vitamin supplementation within 24 hours prior to blood draw may affect the accuracy of the results. This test was developed and its  analytical performance characteristics have been determined by [place], [place]. It has not been cleared or approved by the U.S. Food and Drug Administration. This assay has been validated pursuant to the CLIA regulations and is used for clinical purposes.   Protein Electrophoresis, (serum)     Status: None   Collection Time: 03/17/16 11:05 AM  Result Value Ref Range   Total Protein, Serum Electrophoresis 7.3 6.1 - 8.1 g/dL   Albumin ELP 4.1 3.8 - 4.8 g/dL   Alpha-1-Globulin 0.3 0.2 - 0.3 g/dL   Alpha-2-Globulin 0.8 0.5 - 0.9 g/dL   Beta Globulin 0.5 0.4 - 0.6 g/dL   Beta 2 0.5 0.2 - 0.5 g/dL   Gamma Globulin 1.2 0.8 - 1.7 g/dL   Abnormal Protein Band1 NOT DET g/dL   SPE Interp. SEE NOTE     Comment: Normal Electrophoretic Pattern Reviewed by Odis Hollingshead, MD, PhD, FCAP (Electronic Signature on File)    Abnormal Protein Band2 NOT DET g/dL   Abnormal Protein Band3 NOT DET g/dL  Immunofixation electrophoresis     Status: None   Collection Time: 03/17/16 11:05 AM  Result Value Ref Range   IgG (Immunoglobin G), Serum 1,180 694 - 1,618 mg/dL   IgA 368 81 - 463 mg/dL   IgM, Serum 97 48 - 271 mg/dL   Immunofix Electr Int SEE NOTE     Comment: Normal pattern. No monoclonal proteins detected. Reviewed by Odis Hollingshead, MD, PhD, FCAP (Electronic Signature on File)   [facility] Hb A1c Waived     Status: Abnormal   Collection Time: 03/31/16  8:42 AM  Result Value Ref Range   Bayer DCA Hb A1c Waived 7.4 (H) <7.0 %    Comment:                                       Diabetic Adult            <7.0                                       Healthy Adult        4.3 - 5.7                                                           (DCCT/NGSP) American Diabetes Association's Summary of Glycemic Recommendations for Adults with Diabetes: Hemoglobin A1c <7.0%. More stringent glycemic goals (A1c <6.0%) may further reduce complications at the cost of increased risk of hypoglycemia.   CMP14+EGFR     Status: Abnormal   Collection Time: 03/31/16  9:26 AM  Result Value Ref Range   Glucose 232 (H) 65 - 99 mg/dL   BUN 23 6 - 24 mg/dL   Creatinine, Ser 1.18 0.76 - 1.27 mg/dL   GFR calc non Af Amer 69 >59 mL/min/1.73   GFR calc Af Amer 80 >59 mL/min/1.73   BUN/Creatinine Ratio 19 9 - 20   Sodium 139 134 - 144 mmol/L   Potassium 4.2 3.5 - 5.2 mmol/L   Chloride 93 (L) 96 - 106 mmol/L   CO2 32 (H) 18 -  29 mmol/L   Calcium 9.4 8.7 - 10.2 mg/dL   Total Protein 7.0 6.0 - 8.5 g/dL   Albumin 4.0 3.5 - 5.5 g/dL   Globulin, Total 3.0 1.5 - 4.5 g/dL   Albumin/Globulin Ratio 1.3 1.2 - 2.2   Bilirubin Total 0.6 0.0 - 1.2 mg/dL   Alkaline Phosphatase 82 39 - 117 IU/L   AST 29 0 - 40 IU/L   ALT 32 0 - 44 IU/L  CBC with Differential     Status: None    Collection Time: 03/31/16  9:26 AM  Result Value Ref Range   WBC 8.8 3.4 - 10.8 x10E3/uL   RBC 4.56 4.14 - 5.80 x10E6/uL   Hemoglobin 14.5 12.6 - 17.7 g/dL   Hematocrit 41.7 37.5 - 51.0 %   MCV 91 79 - 97 fL   MCH 31.8 26.6 - 33.0 pg   MCHC 34.8 31.5 - 35.7 g/dL   RDW 13.2 12.3 - 15.4 %   Platelets 271 150 - 379 x10E3/uL   Neutrophils 60 Not Estab. %   Lymphs 27 Not Estab. %   Monocytes 9 Not Estab. %   Eos 3 Not Estab. %   Basos 1 Not Estab. %   Neutrophils Absolute 5.3 1.4 - 7.0 x10E3/uL   Lymphocytes Absolute 2.4 0.7 - 3.1 x10E3/uL   Monocytes Absolute 0.8 0.1 - 0.9 x10E3/uL   EOS (ABSOLUTE) 0.2 0.0 - 0.4 x10E3/uL   Basophils Absolute 0.1 0.0 - 0.2 x10E3/uL   Immature Granulocytes 0 Not Estab. %   Immature Grans (Abs) 0.0 0.0 - 0.1 x10E3/uL    Objective: There were no vitals filed for this visit.  General: Patient is awake, alert, oriented x 3 and in no acute distress.  Dermatology: Skin is warm and dry bilateral with a partial thickness ulceration at the plantar Medial aspect of the left hallux with mild reactive keratosis and a underlying opening that measures 1x1x0.2cm (last measurement 0.3x0.3x0.1cm), no malodor, no active drainage, no erythema, no edema. No acute signs of infection.   Vascular: Dorsalis Pedis pulse = 2/4 Bilateral,  Posterior Tibial pulse = 1/4 Bilateral,  Capillary Fill Time < 5 seconds  Neurologic: Protective sensation intact on right and diminished on left. Sharp pain bilateral consistent with neuropathic pain, unchanged from prior.  Musculosketal: No pain to palpation to ulcerated area at left medial hallux. No pain with compression to calves bilateral. Pes planus deformities noted bilateral. Left drop foot hx of sciatica and nerve damage.   Assessment and Plan:  Problem List Items Addressed This Visit    None    Visit Diagnoses    Toe ulcer, left, limited to breakdown of skin (HCC)    -  Primary   Neuropathy ([place])       Toe pain, left        Pre-op exam          -Examined patient and discussed continued care for ulceration at Left hallux and treatment alternatives. - Debrided ulceration at left hallux to healthy bleeding borders and applied iodosorb and dry dressing. Patient to continue with this at home daily. -Patient opt for surgical management. Consent obtained for Left hallux ulceration debridement,  Exostectomy,  culture, graft. Pre and Post op course explained. Risks, benefits, alternatives explained. No guarantees given or implied. Surgical booking slip submitted and provided patient with Surgical packet and info for [facility] day.  -Dispensed forefoot offloading surgical shoe to use post op -TO DO PRE-OP XRAY IN OR -Continue with accommodative  orthotics and AFO brace for left from [place] clinic which was replaced and modified by Liliane Channel at [place] until time for surgery - Advised patient to go to the ER or return to office if the area worsens or if constitutional symptoms are present. -Continue with neurology follow up  -Patient to return to office after surgery.  Landis Martins, DPM

## 2016-06-11 ENCOUNTER — Ambulatory Visit (INDEPENDENT_AMBULATORY_CARE_PROVIDER_SITE_OTHER): Payer: Medicaid Other | Admitting: Urology

## 2016-06-11 DIAGNOSIS — C641 Malignant neoplasm of right kidney, except renal pelvis: Secondary | ICD-10-CM

## 2016-06-12 ENCOUNTER — Ambulatory Visit (INDEPENDENT_AMBULATORY_CARE_PROVIDER_SITE_OTHER): Payer: Medicaid Other | Admitting: Family Medicine

## 2016-06-12 ENCOUNTER — Encounter: Payer: Self-pay | Admitting: Family Medicine

## 2016-06-12 VITALS — BP 128/77 | HR 79 | Temp 97.0°F | Resp 18 | Ht 77.0 in | Wt 347.2 lb

## 2016-06-12 DIAGNOSIS — H66001 Acute suppurative otitis media without spontaneous rupture of ear drum, right ear: Secondary | ICD-10-CM

## 2016-06-12 DIAGNOSIS — Z01818 Encounter for other preprocedural examination: Secondary | ICD-10-CM

## 2016-06-12 MED ORDER — AMOXICILLIN 500 MG PO CAPS: 500.0000 mg | ORAL_CAPSULE | Freq: Three times a day (TID) | ORAL | 0 refills | Status: DC

## 2016-06-12 NOTE — Progress Notes (Signed)
   HPI  Patient presents today here for preoperative clearance also with cough and ear pain.  Patient is planning to have an ulcer on the left great toe debrided under sedation at day surgery.  Patient complains of recent cough. However he is breathing normally and denies any chest pain. He's had bilateral ear pain however right greater than left. He's eating and drinking normally. He can easily walk up a flight of stairs without any shortness of breath or chest pain.  He has a stable but abnormal EKG which was evaluated with echocardiogram a few years ago with cardiology.  PMH: Smoking status noted ROS: Per HPI  Objective: BP 128/77   Pulse 79   Temp 97 F (36.1 C) (Oral)   Resp 18   Ht 6\' 5"  (1.956 m)   Wt (!) 347 lb 3.2 oz (157.5 kg)   BMI 41.17 kg/m  Gen: NAD, alert, cooperative with exam HEENT: NCAT, right TM with erythema and effusion with straw to purulent colored fluid present behind the TM, left TM within normal limits, oropharynx clear and moist CV: RRR, good S1/S2, no murmur Resp: CTABL, no wheezes, non-labored Abd: SNTND, BS present, no guarding or organomegaly Ext: No edema, warm Neuro: Alert and oriented, No gross deficits  CXR from 06/09/2016 without any infiltrate Labs up-to-date  Assessment and plan:  # Preoperative clearance Patient's average risk for sedation. Unclear whether he'll have conscious sedation or general anesthesia. Hypertension is well controlled, diabetes is moderately controlled.  # Acute otitis media Right-sided acute otitis media Treat with amoxicillin No signs of underlying pneumonia or more serious bacterial infection I do not believe this limits his readiness for surgery in 11 days.   Meds ordered this encounter  Medications  . amoxicillin (AMOXIL) 500 MG capsule    Sig: Take 1 capsule (500 mg total) by mouth 3 (three) times daily.    Dispense:  30 capsule    Refill:  [place], MD [place] 06/12/2016, 11:20 AM

## 2016-06-12 NOTE — Patient Instructions (Signed)
Great to see you!   Otitis Media, Adult Otitis media is redness, soreness, and puffiness (swelling) in the space just behind your eardrum (middle ear). It may be caused by allergies or infection. It often happens along with a cold. Follow these instructions at home:  Take your medicine as told. Finish it even if you start to feel better.  Only take over-the-counter or prescription medicines for pain, discomfort, or fever as told by your doctor.  Follow up with your doctor as told. Contact a doctor if:  You have otitis media only in one ear, or bleeding from your nose, or both.  You notice a lump on your neck.  You are not getting better in 3-5 days.  You feel worse instead of better. Get help right away if:  You have pain that is not helped with medicine.  You have puffiness, redness, or pain around your ear.  You get a stiff neck.  You cannot move part of your face (paralysis).  You notice that the bone behind your ear hurts when you touch it. This information is not intended to replace advice given to you by your health care provider. Make sure you discuss any questions you have with your health care provider. Document Released: 10/22/2007 Document Revised: 10/11/2015 Document Reviewed: 11/30/2012 Elsevier Interactive Patient Education  2017 [employer].

## 2016-06-18 ENCOUNTER — Telehealth: Payer: Self-pay | Admitting: *Deleted

## 2016-06-18 ENCOUNTER — Encounter (HOSPITAL_BASED_OUTPATIENT_CLINIC_OR_DEPARTMENT_OTHER): Payer: Self-pay | Admitting: *Deleted

## 2016-06-18 NOTE — Progress Notes (Signed)
   06/18/16 1154  OBSTRUCTIVE SLEEP APNEA  Have you ever been diagnosed with sleep apnea through a sleep study? Yes  If yes, do you have and use a CPAP or BPAP machine every night? 0  Do you snore loudly (loud enough to be heard through closed doors)?  1  Do you often feel tired, fatigued, or sleepy during the daytime (such as falling asleep during driving or talking to someone)? 0  Has anyone observed you stop breathing during your sleep? 0  Do you have, or are you being treated for high blood pressure? 1  BMI more than 35 kg/m2? 1  Age > 53 (1-yes) 1  Male Gender (Yes=1) 1  Obstructive Sleep Apnea Score 5

## 2016-06-18 NOTE — Telephone Encounter (Signed)
"  We are not going to be able to do his surgery here at [facility], per Dr. Lissa Hoard, on 06/23/2016.  Patient has been non-compliant with recommended treatments.  He's suppose to use a CPAP which he said he never got because he said he didn't have the money.  However, since he has gotten on Medicaid, he still has not gotten it.  He is also Diabetic and his last glucose level was 321 mg/dl.  He is obese with a weight of 360 lbs.  Dr. Lissa Hoard suggests patient has his A1c checked prior to doing any surgery on him as well."  I will let Dr. Cannon Kettle know.

## 2016-06-18 NOTE — Progress Notes (Signed)
Chart reviewed by Dr Lissa Hoard, due to patient uncontrolled comorbidities ie OSA, Diabetes, he will need to be moved to Main OR. Delydia at Dr Leeanne Rio office notified.

## 2016-06-18 NOTE — Telephone Encounter (Signed)
Ok we will postpone surgery and have him follow up with PCP for A1c and management. Ideally we want his A1C close to 7 before doing surgery -Dr. Cannon Kettle

## 2016-06-19 ENCOUNTER — Telehealth: Payer: Self-pay | Admitting: *Deleted

## 2016-06-19 ENCOUNTER — Encounter (HOSPITAL_COMMUNITY)
Admission: RE | Admit: 2016-06-19 | Discharge: 2016-06-19 | Disposition: A | Discharge status: Home / Self Care | Payer: Medicaid Other | Source: Ambulatory Visit | Attending: Sports Medicine | Admitting: Sports Medicine

## 2016-06-19 DIAGNOSIS — E049 Nontoxic goiter, unspecified: Secondary | ICD-10-CM | POA: Diagnosis not present

## 2016-06-19 DIAGNOSIS — F329 Major depressive disorder, single episode, unspecified: Secondary | ICD-10-CM | POA: Insufficient documentation

## 2016-06-19 DIAGNOSIS — I77810 Thoracic aortic ectasia: Secondary | ICD-10-CM | POA: Diagnosis not present

## 2016-06-19 DIAGNOSIS — R809 Proteinuria, unspecified: Secondary | ICD-10-CM | POA: Insufficient documentation

## 2016-06-19 DIAGNOSIS — I129 Hypertensive chronic kidney disease with stage 1 through stage 4 chronic kidney disease, or unspecified chronic kidney disease: Secondary | ICD-10-CM | POA: Insufficient documentation

## 2016-06-19 DIAGNOSIS — N189 Chronic kidney disease, unspecified: Secondary | ICD-10-CM | POA: Diagnosis not present

## 2016-06-19 DIAGNOSIS — R609 Edema, unspecified: Secondary | ICD-10-CM | POA: Insufficient documentation

## 2016-06-19 DIAGNOSIS — N2889 Other specified disorders of kidney and ureter: Secondary | ICD-10-CM | POA: Diagnosis not present

## 2016-06-19 DIAGNOSIS — E785 Hyperlipidemia, unspecified: Secondary | ICD-10-CM | POA: Diagnosis not present

## 2016-06-19 DIAGNOSIS — Z01812 Encounter for preprocedural laboratory examination: Secondary | ICD-10-CM | POA: Insufficient documentation

## 2016-06-19 DIAGNOSIS — E1122 Type 2 diabetes mellitus with diabetic chronic kidney disease: Secondary | ICD-10-CM | POA: Diagnosis not present

## 2016-06-19 LAB — BASIC METABOLIC PANEL
Anion gap: 7 (ref 5–15)
BUN: 24 mg/dL — AB (ref 6–20)
CHLORIDE: 96 mmol/L — AB (ref 101–111)
CO2: 33 mmol/L — ABNORMAL HIGH (ref 22–32)
CREATININE: 1.4 mg/dL — AB (ref 0.61–1.24)
Calcium: 9.4 mg/dL (ref 8.9–10.3)
GFR calc Af Amer: >60 mL/min (ref >60)
GFR calc non Af Amer: 55 mL/min — ABNORMAL LOW (ref >60)
GLUCOSE: 149 mg/dL — AB (ref 65–99)
Potassium: 4 mmol/L (ref 3.5–5.1)
SODIUM: 136 mmol/L (ref 135–145)

## 2016-06-19 NOTE — Telephone Encounter (Signed)
"  Dr. Cannon Kettle has me scheduled for surgery on Monday.  The hospital just called and said I am scheduled for 3:00 in the afternoon.  I can't eat anything after midnight.  I think we need to schedule it early in the morning because I don't think I can go that long without eating.  If you would give me a call back.  Surgery was canceled.  Dr. Cannon Kettle wants lab work A1c.

## 2016-06-19 NOTE — Telephone Encounter (Signed)
"  I am calling on behalf of Dr. Cannon Kettle.  She stated your surgery will have to be canceled.  Your glucose level has been too high.  She wants you to have your A1c checked.  The reason for this decision is that if your glucose is not under control, you risk the possibility of not healing which could lead to loss of limb.  So she wants medical clearance.  "He already did that."  Well we need the A1c.  "Okay"   I called and canceled surgery.  I spoke with Delilah Shan at [facility] Surgery Scheduling.

## 2016-06-23 ENCOUNTER — Ambulatory Visit (HOSPITAL_BASED_OUTPATIENT_CLINIC_OR_DEPARTMENT_OTHER): Admission: RE | Admit: 2016-06-23 | Payer: Medicaid Other | Source: Ambulatory Visit | Admitting: Sports Medicine

## 2016-06-23 HISTORY — DX: Type 2 diabetes mellitus without complications: E11.9

## 2016-06-23 HISTORY — DX: Major depressive disorder, single episode, unspecified: F32.9

## 2016-06-23 HISTORY — DX: Depression, unspecified: F32.A

## 2016-06-23 HISTORY — DX: Anxiety disorder, unspecified: F41.9

## 2016-06-23 SURGERY — IRRIGATION AND DEBRIDEMENT FOOT
Anesthesia: Monitor Anesthesia Care | Laterality: Left

## 2016-07-01 ENCOUNTER — Encounter: Payer: Medicaid Other | Admitting: Sports Medicine

## 2016-07-07 ENCOUNTER — Ambulatory Visit: Payer: Medicaid Other | Admitting: Pharmacist

## 2016-07-08 ENCOUNTER — Encounter: Payer: Medicaid Other | Admitting: Sports Medicine

## 2016-07-10 ENCOUNTER — Encounter: Payer: Self-pay | Admitting: Pharmacist

## 2016-07-10 ENCOUNTER — Ambulatory Visit (INDEPENDENT_AMBULATORY_CARE_PROVIDER_SITE_OTHER): Payer: Medicaid Other | Admitting: Pharmacist

## 2016-07-10 VITALS — BP 150/90 | HR 78 | Ht 77.0 in | Wt 349.0 lb

## 2016-07-10 DIAGNOSIS — E1142 Type 2 diabetes mellitus with diabetic polyneuropathy: Secondary | ICD-10-CM | POA: Diagnosis not present

## 2016-07-10 DIAGNOSIS — IMO0001 Reserved for inherently not codable concepts without codable children: Secondary | ICD-10-CM

## 2016-07-10 DIAGNOSIS — E6609 Other obesity due to excess calories: Secondary | ICD-10-CM | POA: Diagnosis not present

## 2016-07-10 DIAGNOSIS — Z6841 Body Mass Index (BMI) 40.0 and over, adult: Secondary | ICD-10-CM | POA: Diagnosis not present

## 2016-07-10 DIAGNOSIS — Z85528 Personal history of other malignant neoplasm of kidney: Secondary | ICD-10-CM | POA: Insufficient documentation

## 2016-07-10 DIAGNOSIS — C641 Malignant neoplasm of right kidney, except renal pelvis: Secondary | ICD-10-CM | POA: Insufficient documentation

## 2016-07-10 LAB — BAYER DCA HB A1C WAIVED: HB A1C: 6.6 % (ref <7.0)

## 2016-07-10 MED ORDER — GLUCOSE BLOOD VI STRP: ORAL_STRIP | 2 refills | Status: DC

## 2016-07-10 MED ORDER — ACCU-CHEK AVIVA PLUS W/DEVICE KIT: PACK | 0 refills | Status: DC

## 2016-07-10 MED ORDER — LANCETS MISC: 2 refills | Status: DC

## 2016-07-10 NOTE — Patient Instructions (Addendum)
Continue with weight loss and dietary changes.   Check blood glucose once a day to every other day  Diabetes and Standards of Medical Care   Diabetes is complicated. You may find that your diabetes team includes a dietitian, nurse, diabetes educator, eye doctor, and more. To help everyone know what is going on and to help you get the care you deserve, the following schedule of care was developed to help keep you on track. Below are the tests, exams, vaccines, medicines, education, and plans you will need.  Blood Glucose Goals Prior to meals = 80 - 130 Within 2 hours of the start of a meal = less than 180  HbA1c test (goal is less than 6.5% - your last value was 6.6%) This test shows how well you have controlled your glucose over the past 2 to 3 months. It is used to see if your diabetes management plan needs to be adjusted.   It is performed at least 2 times a year if you are meeting treatment goals.  It is performed 4 times a year if therapy has changed or if you are not meeting treatment goals.  Blood pressure test  This test is performed at every routine medical visit. The goal is less than 140/90 mmHg for most people, but 130/80 mmHg in some cases. Ask your health care provider about your goal.  Dental exam  Follow up with the dentist regularly.  Eye exam  If you are diagnosed with type 1 diabetes as a child, get an exam upon reaching the age of 70 years or older and have had diabetes for 3 to 5 years. Yearly eye exams are recommended after that initial eye exam.  If you are diagnosed with type 1 diabetes as an adult, get an exam within 5 years of diagnosis and then yearly.  If you are diagnosed with type 2 diabetes, get an exam as soon as possible after the diagnosis and then yearly.  Foot care exam  Visual foot exams are performed at every routine medical visit. The exams check for cuts, injuries, or other problems with the feet.  A comprehensive foot exam should be done  yearly. This includes visual inspection as well as assessing foot pulses and testing for loss of sensation.  Check your feet nightly for cuts, injuries, or other problems with your feet. Tell your health care provider if anything is not healing.  Kidney function test (urine microalbumin)  This test is performed once a year.  Type 1 diabetes: The first test is performed 5 years after diagnosis.  Type 2 diabetes: The first test is performed at the time of diagnosis.  A serum creatinine and estimated glomerular filtration rate (eGFR) test is done once a year to assess the level of chronic kidney disease (CKD), if present.  Lipid profile (cholesterol, HDL, LDL, triglycerides)  Performed every 5 years for most people.  The goal for LDL is less than 100 mg/dL. If you are at high risk, the goal is less than 70 mg/dL.  The goal for HDL is 40 mg/dL to 50 mg/dL for men and 50 mg/dL to 60 mg/dL for women. An HDL cholesterol of 60 mg/dL or higher gives some protection against heart disease.  The goal for triglycerides is less than 150 mg/dL.  Influenza vaccine, pneumococcal vaccine, and hepatitis B vaccine  The influenza vaccine is recommended yearly.  The pneumococcal vaccine is generally given once in a lifetime. However, there are some instances when another vaccination is  recommended. Check with your health care provider.  The hepatitis B vaccine is also recommended for adults with diabetes.  Diabetes self-management education  Education is recommended at diagnosis and ongoing as needed.  Treatment plan  Your treatment plan is reviewed at every medical visit.     Increase non-starchy vegetables - carrots, green bean, squash, zucchini, tomatoes, onions, peppers, spinach and other green leafy vegetables, cabbage, lettuce, cucumbers, asparagus, okra (not fried), eggplant Limit sugar and processed foods (cakes, cookies, ice cream, crackers and chips) Increase fresh fruit but limit  serving sizes 1/2 cup or about the size of tennis or baseball Limit red meat to no more than 1-2 times per week (serving size about the size of your palm) Choose whole grains / lean proteins - whole wheat bread, quinoa, whole grain rice (1/2 cup), fish, chicken, [place] Avoid sugar and calorie containing beverages - soda, sweet tea and juice.  Choose water or unsweetened tea instead.

## 2016-07-10 NOTE — Progress Notes (Signed)
Patient ID: Anthony Logan, male   DOB: Sep 19, 1960, 56 y.o.   MRN: [id number]  Subjective:    Anthony Logan is a 56 y.o. male who presents for an initial evaluation of Type 2 diabetes mellitus.   Anthony Logan has surgery planned for ulcer on left great toe.  Surgery however was cancelled because surgeon need more recent A1c to determine T2DM control and repeat sleep study.  Anthony Logan has renal cell carcinoma in 2017 with nephrectomy of right kidney.   He has had neuropathy for several years.  Toe ulcer started as callus from steel toed shoes and because he cut on foot himself.  See Dr Cannon Kettle who had tried conservative treatment to get ulcer to heal but has been unsuccessful. Dr Cannon Kettle will perform toe surgery once cleared.   The patient was initially diagnosed with Type 2 diabetes mellitus 6 months ago. Started metformin 500mg  qd but BG increased so he stopped. Per patient BG has been better since stopping metformin  Known diabetic complications: nephropathy and peripheral neuropathy Cardiovascular risk factors: advanced age (older than 65 for men, 34 for women), diabetes mellitus, hypertension, male gender, obesity (BMI >= 30 kg/m2) and sedentary lifestyle Current diabetic medications include none.   Eye exam current (within one year): yes Weight trend: has lost 10 lbs over the last month with Weight Watchers plan Prior visit with CDE: no Current diet: patient has been doing Weight Watchers for last month and has lost about 10 lbs.  Prior to this he was not following CHO counting diet. Current exercise: none  Current monitoring regimen: home blood tests - 2-3 times monthly Home blood sugar records: usually checks in am - BG ranges from 120's to 150's Any episodes of hypoglycemia? no  Is He on ACE inhibitor or angiotensin II receptor blocker?  Yes  lisinopril (Zestril)    The following portions of the patient's history were reviewed and updated as appropriate: allergies, current medications,  past family history, past medical history, past social history, past surgical history and problem list.    Objective:    BP (!) 150/90   Pulse 78   Ht 6\' 5"  (1.956 m)   Wt (!) 349 lb (158.3 kg)   BMI 41.39 kg/m   Lab Review Glucose (mg/dL)  Date Value  03/31/2016 232 (H)  11/22/2015 321 (H)   Glucose, Bld (mg/dL)  Date Value  06/19/2016 149 (H)  08/28/2015 123 (H)  08/27/2015 173 (H)   CO2 (mmol/L)  Date Value  06/19/2016 33 (H)  03/31/2016 32 (H)  11/22/2015 28   BUN (mg/dL)  Date Value  06/19/2016 24 (H)  03/31/2016 23  11/22/2015 21  08/28/2015 20  08/27/2015 18   Creat (mg/dL)  Date Value  07/09/2015 0.73  06/05/2015 0.70  03/28/2015 0.70   Creatinine, Ser (mg/dL)  Date Value  06/19/2016 1.40 (H)  03/31/2016 1.18  11/22/2015 1.06    A1c = 6.6% today  Diabetic Foot Form - Detailed   Diabetic Foot Exam - detailed Diabetic Foot exam was performed with the following findings:  Yes 07/10/2016 11:44 AM  Visual Foot Exam completed.:  Yes  Is there a history of foot ulcer?:  Yes Can the patient see the bottom of their feet?:  Yes Are the shoes appropriate in style and fit?:  Yes (Comment: has inserts for both shoes to give extra support) Is there swelling or and abnormal foot shape?:  Yes Are the toenails long?:  No Are the toenails thick?:  No Do you have pain in calf while walking?:  Yes Is there a claw toe deformity?:  Yes Is there elevated skin temparature?:  No Is there limited skin dorsiflexion?:  No Is there foot or ankle muscle weakness?:  Yes Are the toenails ingrown?:  No Normal Range of Motion:  Yes Pulse Foot Exam completed.:  Yes  Right posterior Tibialias:  Present Left posterior Tibialias:  Present  Right Dorsalis Pedis:  Present Left Dorsalis Pedis:  Present  Semmes-Weinstein Monofilament Test R Foot Test Control:  Pos L Foot Test Control:  Pos  R Site 1-Great Toe:  Neg L Site 1-Great Toe:  Neg  R Site 4:  Pos L Site 4:  Neg  R  Site 5:  Pos L Site 5:  Neg    Comments:  Patient sees podiatrist for ulcer on left great toe - surgery is planned for future.       Assessment:    Diabetes Mellitus type II, under adequate control.   Obesity HTN - hydralazine was increased 2 d ago by nephrologist but patient has not started new dose yet   Plan:    1.  Rx changes:   No change in diabetes medications   Start hydralazine 25mg  tid per new Rx  From Dr Kandyce Rud 2.  Education: Reviewed 'ABCs' of diabetes management (respective goals in parentheses):  A1C (<7), blood pressure (<130/80), and cholesterol (LDL <100). 3. Foot exam performed today 4. CHO counting diet discussed.  Reviewed CHO amount in various foods and how to read nutrition labels.  Discussed recommended serving sizes.  5.  Recommend check BG 1  times a day.  Rx sent pharmacy for new glucometer, test strips and lancets. 6.  Recommended increase physical activity - goal is 150 minutes per week 7. Follow up: 1 month with PCP for HTN

## 2016-08-05 ENCOUNTER — Other Ambulatory Visit: Payer: Self-pay | Admitting: Family Medicine

## 2016-08-07 ENCOUNTER — Encounter: Payer: Self-pay | Admitting: Family Medicine

## 2016-08-07 ENCOUNTER — Ambulatory Visit (INDEPENDENT_AMBULATORY_CARE_PROVIDER_SITE_OTHER): Payer: Medicaid Other | Admitting: Family Medicine

## 2016-08-07 VITALS — BP 134/77 | HR 54 | Temp 97.5°F | Ht 77.0 in | Wt 345.0 lb

## 2016-08-07 DIAGNOSIS — I10 Essential (primary) hypertension: Secondary | ICD-10-CM | POA: Diagnosis not present

## 2016-08-07 DIAGNOSIS — M79674 Pain in right toe(s): Secondary | ICD-10-CM | POA: Diagnosis not present

## 2016-08-07 DIAGNOSIS — E1142 Type 2 diabetes mellitus with diabetic polyneuropathy: Secondary | ICD-10-CM | POA: Diagnosis not present

## 2016-08-07 MED ORDER — COLCHICINE 0.6 MG PO TABS: ORAL_TABLET | ORAL | 2 refills | Status: DC

## 2016-08-07 NOTE — Progress Notes (Signed)
   HPI  Patient presents today with right great toe pain.  Patient explains that he's had right great toe pain for about 6 days, it starts at the right first MTP and radiates up his foot and then up to the leg. He has developed redness and swelling of the same area.  He has a chronic ulcer of the left great toe, he was going to have surgery for this, however that was canceled due to untreated OSA. His diabetes is well controlled now with an A1c of 6.4.  Diabetes Recently using and out on his phone he is logged average glucose fasting of 120-140, he has had 3 readings at 170 and 180. He can identify dietary indiscretion at these times.  Hypertension.  Tolerating medications well No chest pain, dyspnea, palpitations.  Patient is status post nephrectomy for renal tumor. PMH: Smoking status noted ROS: Per HPI  Objective: BP 134/77   Pulse (!) 54   Temp 97.5 F (36.4 C) (Oral)   Ht 6' 5" (1.956 m)   Wt (!) 345 lb (156.5 kg)   BMI 40.91 kg/m  Gen: NAD, alert, cooperative with exam HEENT: NCAT, EOMI, PERRL CV: RRR, good S1/S2, no murmur Resp: CTABL, no wheezes, non-labored Ext: No edema, warm Neuro: Alert and oriented, No gross deficits  MSK Redness and tenderness at the first R MTP, redness extending up to the calf, no calf tenderness, cast circumference is 39 cm bilaterally  Clean dry intact bandage on the left great toe  Assessment and plan:  # Great toe pain Likely gouty arthritis Treat with colchicine, and did use caution and dosing with previous nephrectomy, his creatinine clearance is calculated to be 132.   # Hypertension Well-controlled on current medications, no changes Labs with previous elevation in creatinine  # 2 diabetes A1c now improved, consider control. Fastings are overall controlled. Congratulated     Orders Placed This Encounter  Procedures  . BMP8+EGFR  . CBC with Differential/Platelet  . Lipid panel    Meds ordered this encounter    Medications  . colchicine 0.6 MG tablet    Sig: 2 pills at first sign of flare, 1 pill 1 hour later. Then 1 pill daily for 3 days    Dispense:  6 tablet    Refill:  2    Sam Bradshaw, MD [facility] 08/07/2016, 9:15 AM     

## 2016-08-07 NOTE — Patient Instructions (Addendum)
Great to see you!  Please call if you are not improving by tomorrow morning, we will add an antibiotic  Lets plan on following up for diabetes in 2 months  If you develop fever, chills, sweats, or any concerns for infection please get help right away.   Gout Gout is painful swelling that can occur in some of your joints. Gout is a type of arthritis. This condition is caused by having too much uric acid in your body. Uric acid is a chemical that forms when your body breaks down substances called purines. Purines are important for building body proteins. When your body has too much uric acid, sharp crystals can form and build up inside your joints. This causes pain and swelling. Gout attacks can happen quickly and be very painful (acute gout). Over time, the attacks can affect more joints and become more frequent (chronic gout). Gout can also cause uric acid to build up under your skin and inside your kidneys. What are the causes? This condition is caused by too much uric acid in your blood. This can occur because:  Your kidneys do not remove enough uric acid from your blood. This is the most common cause.  Your body makes too much uric acid. This can occur with some cancers and cancer treatments. It can also occur if your body is breaking down too many red blood cells (hemolytic anemia).  You eat too many foods that are high in purines. These foods include organ meats and some seafood. Alcohol, especially beer, is also high in purines. A gout attack may be triggered by trauma or stress. What increases the risk? This condition is more likely to develop in people who:  Have a family history of gout.  Are male and middle-aged.  Are male and have gone through menopause.  Are obese.  Frequently drink alcohol, especially beer.  Are dehydrated.  Lose weight too quickly.  Have an organ transplant.  Have lead poisoning.  Take certain medicines, including aspirin, cyclosporine,  diuretics, levodopa, and niacin.  Have kidney disease or psoriasis. What are the signs or symptoms? An attack of acute gout happens quickly. It usually occurs in just one joint. The most common place is the big toe. Attacks often start at night. Other joints that may be affected include joints of the feet, ankle, knee, fingers, wrist, or elbow. Symptoms may include:  Severe pain.  Warmth.  Swelling.  Stiffness.  Tenderness. The affected joint may be very painful to touch.  Shiny, red, or purple skin.  Chills and fever. Chronic gout may cause symptoms more frequently. More joints may be involved. You may also have white or yellow lumps (tophi) on your hands or feet or in other areas near your joints. How is this diagnosed? This condition is diagnosed based on your symptoms, medical history, and physical exam. You may have tests, such as:  Blood tests to measure uric acid levels.  Removal of joint fluid with a needle (aspiration) to look for uric acid crystals.  X-rays to look for joint damage. How is this treated? Treatment for this condition has two phases: treating an acute attack and preventing future attacks. Acute gout treatment may include medicines to reduce pain and swelling, including:  NSAIDs.  Steroids. These are strong anti-inflammatory medicines that can be taken by mouth (orally) or injected into a joint.  Colchicine. This medicine relieves pain and swelling when it is taken soon after an attack. It can be given orally or through an  IV tube. Preventive treatment may include:  Daily use of smaller doses of NSAIDs or colchicine.  Use of a medicine that reduces uric acid levels in your blood.  Changes to your diet. You may need to see a specialist about healthy eating (dietitian). Follow these instructions at home: During a Gout Attack   If directed, apply ice to the affected area:  Put ice in a plastic bag.  Place a towel between your skin and the  bag.  Leave the ice on for 20 minutes, 2-3 times a day.  Rest the joint as much as possible. If the affected joint is in your leg, you may be given crutches to use.  Raise (elevate) the affected joint above the level of your heart as often as possible.  Drink enough fluids to keep your urine clear or pale yellow.  Take over-the-counter and prescription medicines only as told by your health care provider.  Do not drive or operate heavy machinery while taking prescription pain medicine.  Follow instructions from your health care provider about eating or drinking restrictions.  Return to your normal activities as told by your health care provider. Ask your health care provider what activities are safe for you. Avoiding Future Gout Attacks   Follow a low-purine diet as told by your dietitian or health care provider. Avoid foods and drinks that are high in purines, including liver, kidney, anchovies, asparagus, herring, mushrooms, mussels, and beer.  Limit alcohol intake to no more than 1 drink a day for nonpregnant women and 2 drinks a day for men. One drink equals 12 oz of beer, 5 oz of wine, or 1 oz of hard liquor.  Maintain a healthy weight or lose weight if you are overweight. If you want to lose weight, talk with your health care provider. It is important that you do not lose weight too quickly.  Start or maintain an exercise program as told by your health care provider.  Drink enough fluids to keep your urine clear or pale yellow.  Take over-the-counter and prescription medicines only as told by your health care provider.  Keep all follow-up visits as told by your health care provider. This is important. Contact a health care provider if:  You have another gout attack.  You continue to have symptoms of a gout attack after10 days of treatment.  You have side effects from your medicines.  You have chills or a fever.  You have burning pain when you urinate.  You have pain  in your lower back or belly. Get help right away if:  You have severe or uncontrolled pain.  You cannot urinate. This information is not intended to replace advice given to you by your health care provider. Make sure you discuss any questions you have with your health care provider. Document Released: 05/02/2000 Document Revised: 10/11/2015 Document Reviewed: 02/15/2015 Elsevier Interactive Patient Education  2017 [employer].

## 2016-08-08 LAB — LIPID PANEL
CHOL/HDL RATIO: 4.8 ratio (ref 0.0–5.0)
Cholesterol, Total: 126 mg/dL (ref 100–199)
HDL: 26 mg/dL — ABNORMAL LOW (ref >39)
LDL CALC: 47 mg/dL (ref 0–99)
Triglycerides: 267 mg/dL — ABNORMAL HIGH (ref 0–149)
VLDL Cholesterol Cal: 53 mg/dL — ABNORMAL HIGH (ref 5–40)

## 2016-08-08 LAB — CBC WITH DIFFERENTIAL/PLATELET
BASOS: 0 %
Basophils Absolute: 0 10*3/uL (ref 0.0–0.2)
EOS (ABSOLUTE): 0.3 10*3/uL (ref 0.0–0.4)
EOS: 2 %
HEMOGLOBIN: 14.4 g/dL (ref 13.0–17.7)
Hematocrit: 43.7 % (ref 37.5–51.0)
Immature Grans (Abs): 0 10*3/uL (ref 0.0–0.1)
Immature Granulocytes: 0 %
LYMPHS ABS: 2.4 10*3/uL (ref 0.7–3.1)
Lymphs: 18 %
MCH: 31.8 pg (ref 26.6–33.0)
MCHC: 33 g/dL (ref 31.5–35.7)
MCV: 97 fL (ref 79–97)
MONOS ABS: 1.1 10*3/uL — AB (ref 0.1–0.9)
Monocytes: 9 %
NEUTROS ABS: 9.2 10*3/uL — AB (ref 1.4–7.0)
Neutrophils: 71 %
Platelets: 343 10*3/uL (ref 150–379)
RBC: 4.53 x10E6/uL (ref 4.14–5.80)
RDW: 13.2 % (ref 12.3–15.4)
WBC: 13.1 10*3/uL — ABNORMAL HIGH (ref 3.4–10.8)

## 2016-08-08 LAB — BMP8+EGFR
BUN / CREAT RATIO: 16 (ref 9–20)
BUN: 20 mg/dL (ref 6–24)
CO2: 29 mmol/L (ref 18–29)
CREATININE: 1.26 mg/dL (ref 0.76–1.27)
Calcium: 9.5 mg/dL (ref 8.7–10.2)
Chloride: 96 mmol/L (ref 96–106)
GFR, EST AFRICAN AMERICAN: 74 mL/min/{1.73_m2} (ref >59)
GFR, EST NON AFRICAN AMERICAN: 64 mL/min/{1.73_m2} (ref >59)
GLUCOSE: 167 mg/dL — AB (ref 65–99)
Potassium: 4.4 mmol/L (ref 3.5–5.2)
SODIUM: 141 mmol/L (ref 134–144)

## 2016-09-02 ENCOUNTER — Telehealth: Payer: Self-pay

## 2016-09-02 ENCOUNTER — Other Ambulatory Visit (HOSPITAL_BASED_OUTPATIENT_CLINIC_OR_DEPARTMENT_OTHER): Payer: Self-pay

## 2016-09-02 DIAGNOSIS — G4733 Obstructive sleep apnea (adult) (pediatric): Secondary | ICD-10-CM

## 2016-09-02 MED ORDER — COLCHICINE 0.6 MG PO CAPS: ORAL_CAPSULE | ORAL | 2 refills | Status: DC

## 2016-09-02 NOTE — Telephone Encounter (Signed)
Rx for capsules sent.   Laroy Apple, MD [facility] 09/02/2016, 5:49 PM

## 2016-09-07 ENCOUNTER — Ambulatory Visit: Payer: Medicaid Other | Attending: Neurology | Admitting: Neurology

## 2016-09-07 DIAGNOSIS — G4761 Periodic limb movement disorder: Secondary | ICD-10-CM | POA: Diagnosis not present

## 2016-09-07 DIAGNOSIS — G4733 Obstructive sleep apnea (adult) (pediatric): Secondary | ICD-10-CM | POA: Insufficient documentation

## 2016-09-10 DIAGNOSIS — Z029 Encounter for administrative examinations, unspecified: Secondary | ICD-10-CM

## 2016-09-15 ENCOUNTER — Ambulatory Visit: Payer: Medicaid Other | Admitting: Neurology

## 2016-09-15 ENCOUNTER — Ambulatory Visit (INDEPENDENT_AMBULATORY_CARE_PROVIDER_SITE_OTHER): Payer: Medicaid Other | Admitting: Family Medicine

## 2016-09-15 ENCOUNTER — Encounter: Payer: Self-pay | Admitting: Family Medicine

## 2016-09-15 VITALS — BP 163/86 | HR 55 | Temp 97.4°F | Ht 77.0 in | Wt 350.0 lb

## 2016-09-15 DIAGNOSIS — N410 Acute prostatitis: Secondary | ICD-10-CM | POA: Diagnosis not present

## 2016-09-15 DIAGNOSIS — I10 Essential (primary) hypertension: Secondary | ICD-10-CM

## 2016-09-15 LAB — URINALYSIS, COMPLETE
BILIRUBIN UA: NEGATIVE
Glucose, UA: NEGATIVE
Ketones, UA: NEGATIVE
LEUKOCYTES UA: NEGATIVE
NITRITE UA: NEGATIVE
SPEC GRAV UA: 1.015 (ref 1.005–1.030)
Urobilinogen, Ur: 0.2 mg/dL (ref 0.2–1.0)
pH, UA: 5.5 (ref 5.0–7.5)

## 2016-09-15 LAB — MICROSCOPIC EXAMINATION
Bacteria, UA: NONE SEEN
Renal Epithel, UA: NONE SEEN /hpf
WBC, UA: NONE SEEN /hpf (ref 0–?)

## 2016-09-15 MED ORDER — SULFAMETHOXAZOLE-TRIMETHOPRIM 800-160 MG PO TABS: 1.0000 | ORAL_TABLET | Freq: Two times a day (BID) | ORAL | 0 refills | Status: DC

## 2016-09-15 MED ORDER — LISINOPRIL 30 MG PO TABS: 30.0000 mg | ORAL_TABLET | Freq: Every day | ORAL | 2 refills | Status: DC

## 2016-09-15 NOTE — Addendum Note (Signed)
Addended by: Nigel Berthold C on: 09/15/2016 01:55 PM   Modules accepted: Orders

## 2016-09-15 NOTE — Patient Instructions (Signed)
Great to see you!  Come back as scheduled, we will look at your labs again on increased lisinopril  Start bactrim twice daily for 3 weeks for prostatitis   Prostatitis Prostatitis is swelling of the prostate gland. The prostate helps to make semen. It is below a man's bladder, in front of the rectum. There are different types of prostatitis. Follow these instructions at home:  Take over-the-counter and prescription medicines only as told by your doctor.  If you were prescribed an antibiotic medicine, take it as told by your doctor. Do not stop taking the antibiotic even if you start to feel better.  If your doctor prescribed exercises, do them as directed.  Take sitz baths as told by your doctor. To take a sitz bath, sit in warm water that is deep enough to cover your hips and butt.  Keep all follow-up visits as told by your doctor. This is important. Contact a doctor if:  Your symptoms get worse.  You have a fever. Get help right away if:  You have chills.  You feel sick to your stomach (nauseous).  You throw up (vomit).  You feel light-headed.  You feel like you might pass out (faint).  You cannot pee (urinate).  You have blood or clumps of blood (blood clots) in your pee (urine). This information is not intended to replace advice given to you by your health care provider. Make sure you discuss any questions you have with your health care provider. Document Released: 11/04/2011 Document Revised: 01/24/2016 Document Reviewed: 01/24/2016 Elsevier Interactive Patient Education  2017 [employer].

## 2016-09-15 NOTE — Progress Notes (Signed)
   HPI  Patient presents today here with concern for UTI.  Patient explains that he's had pelvic pain and pressure with dysuria for about 3 days.  He denies fever, chills, sweats. He has had increased urinary frequency.  Azo helps  These tolerating food and fluids like usual.  Blood pressure at home is usually 944 systolic, very good medication compliance. Patient has history of one nephrectomy, has a single kidney.  PMH: Smoking status noted ROS: Per HPI  Objective: BP (!) 163/86   Pulse (!) 55   Temp 97.4 F (36.3 C) (Oral)   Ht 6\' 5"  (1.956 m)   Wt (!) 350 lb (158.8 kg)   BMI 41.50 kg/m  Gen: NAD, alert, cooperative with exam HEENT: NCAT CV: RRR, good S1/S2, no murmur Resp: CTABL, no wheezes, non-labored Abd: SNTND, no CVA tenderness, no suprapubic tenderness Ext: No edema, warm Neuro: Alert and oriented, No gross deficits  DRE Normal sphincter tone, prostate slightly tender to palpation with no asymmetry.  Assessment and plan:  # Acute prostatitis Treat with Bactrim Discussed usual course of illness and low threshold for follow-up of symptoms returned after medication is finished. Urine culture   # Hypertension Uncontrolled, likely elevated somewhat due to infection and acute illness, however elevated at home as well. Slight increase in lisinopril to 30 mg, repeat labs in about 2 weeks on follow-up.   Orders Placed This Encounter  Procedures  . Urinalysis, Complete    Meds ordered this encounter  Medications  . sulfamethoxazole-trimethoprim (BACTRIM DS) 800-160 MG tablet    Sig: Take 1 tablet by mouth 2 (two) times daily.    Dispense:  42 tablet    Refill:  0  . lisinopril (PRINIVIL,ZESTRIL) 30 MG tablet    Sig: Take 1 tablet (30 mg total) by mouth daily.    Dispense:  30 tablet    Refill:  Cave, MD [facility] 09/15/2016, 9:27 AM

## 2016-09-16 NOTE — Procedures (Signed)
[place] A. Merlene Laughter, MD     [email or web address]             NOCTURNAL POLYSOMNOGRAPHY   LOCATION: [facility]  Patient Name: Anthony Logan, Anthony Logan Date: 09/07/2016 Gender: Male D.O.B: November 01, 1960 Age (years): 75 Referring Provider: Barton Fanny NP Height (inches): 77 Interpreting Physician: Phillips Odor MD, ABSM Weight (lbs): 345 RPSGT: Rosebud Poles BMI: 41 MRN: [id number] Neck Size: 22.00 CLINICAL INFORMATION Sleep Study Type: NPSG  Indication for sleep study: N/A  Epworth Sleepiness Score: 9  SLEEP STUDY TECHNIQUE As per the AASM Manual for the Scoring of Sleep and Associated Events v2.3 (April 2016) with a hypopnea requiring 4% desaturations.  The channels recorded and monitored were frontal, central and occipital EEG, electrooculogram (EOG), submentalis EMG (chin), nasal and oral airflow, thoracic and abdominal wall motion, anterior tibialis EMG, snore microphone, electrocardiogram, and pulse oximetry.  MEDICATIONS Medications self-administered by patient taken the night of the study : N/A  Current Outpatient Prescriptions:  .  aspirin 81 MG chewable tablet, Chew 81 mg by mouth daily., Disp: , Rfl:  .  Blood Glucose Monitoring Suppl (ACCU-CHEK AVIVA PLUS) w/Device KIT, Use to check BG daily, Disp: 1 kit, Rfl: 0 .  buPROPion (WELLBUTRIN XL) 300 MG 24 hr tablet, Take 1 tablet (300 mg total) by mouth daily., Disp: 30 tablet, Rfl: 5 .  cholecalciferol (VITAMIN D) 1000 units tablet, Take 1,000 Units by mouth daily., Disp: , Rfl:  .  Colchicine 0.6 MG CAPS, 2 pills at first sign of flare, 1 pill 1 hour later. Then 1 pill daily for 3 days, Disp: 6 capsule, Rfl: 2 .  furosemide (LASIX) 20 MG tablet, TAKE TWO TABLETS BY MOUTH IN THE MORNING AND ONE IN THE AFTERNOON, Disp: 270 tablet, Rfl: 1 .  gabapentin (NEURONTIN) 100 MG capsule, Take 1-2 capsules (100-200 mg total) by mouth at bedtime., Disp: 60 capsule, Rfl: 3 .  glucose blood (ACCU-CHEK AVIVA) test  strip, Use to check BG daily, Disp: 100 each, Rfl: 2 .  hydrALAZINE (APRESOLINE) 25 MG tablet, Take 25 mg by mouth 3 (three) times daily., Disp: , Rfl:  .  hydrochlorothiazide (HYDRODIURIL) 25 MG tablet, TAKE ONE TABLET BY MOUTH ONCE DAILY, Disp: 30 tablet, Rfl: 4 .  Lancets MISC, Use to check BG daily, Disp: 100 each, Rfl: 2 .  lisinopril (PRINIVIL,ZESTRIL) 30 MG tablet, Take 1 tablet (30 mg total) by mouth daily., Disp: 30 tablet, Rfl: 2 .  metFORMIN (GLUCOPHAGE) 500 MG tablet, Take by mouth 2 (two) times daily with a meal. Pt states he is not taking, Disp: , Rfl:  .  metoprolol (LOPRESSOR) 100 MG tablet, TAKE TWO TABLETS BY MOUTH TWICE DAILY, Disp: 360 tablet, Rfl: 1 .  Prenatal Vit-Fe Fumarate-FA (M-VIT PO), Take 1 tablet by mouth daily., Disp: , Rfl:  .  sulfamethoxazole-trimethoprim (BACTRIM DS) 800-160 MG tablet, Take 1 tablet by mouth 2 (two) times daily., Disp: 42 tablet, Rfl: 0   SLEEP ARCHITECTURE The study was initiated at 10:02:00 PM and ended at 5:06:09 AM.  Sleep onset time was 11.8 minutes and the sleep efficiency was 80.2%. The total sleep time was 340.4 minutes.  Stage REM latency was 62.5 minutes.  The patient spent 2.06% of the night in stage N1 sleep, 52.70% in stage N2 sleep, 24.38% in stage N3 and 20.86% in REM.  Alpha intrusion was absent.  Supine sleep was 0.00%.  RESPIRATORY PARAMETERS The overall apnea/hypopnea index (AHI) was 71.4 per hour. There were 298 total apneas, including 240  obstructive, 30 central and 28 mixed apneas. There were 107 hypopneas and 0 RERAs.  The AHI during Stage REM sleep was 73.5 per hour.  AHI while supine was N/A per hour.  The mean oxygen saturation was 89.58%. The minimum SpO2 during sleep was 60.00%.  Loud snoring was noted during this study.  CARDIAC DATA The 2 lead EKG demonstrated sinus rhythm. The mean heart rate was N/A beats per minute. Other EKG findings include: PVCs. LEG MOVEMENT DATA The total PLMS were 84 with  a resulting PLMS index of 14.81. Associated arousal with leg movement index was 0.0.  IMPRESSIONS - Severe obstructive sleep apnea worse during REM sleep (desaturations) is documented with this recording. Auto Pap 10-20 is recommended. - Mild periodic limb movements of sleep occurred during the study. No significant associated arousals.   Delano Metz, MD Diplomate, American Board of Sleep Medicine.   ELECTRONICALLY SIGNED ON:  09/16/2016, 9:53 AM [place] PH: [phone]   FX: [phone] [place]

## 2016-09-17 LAB — URINE CULTURE: Organism ID, Bacteria: NO GROWTH

## 2016-09-29 ENCOUNTER — Encounter: Payer: Self-pay | Admitting: Family Medicine

## 2016-09-29 ENCOUNTER — Ambulatory Visit (INDEPENDENT_AMBULATORY_CARE_PROVIDER_SITE_OTHER): Payer: Medicaid Other | Admitting: Family Medicine

## 2016-09-29 VITALS — BP 139/77 | HR 54 | Temp 97.4°F | Ht 77.0 in | Wt 356.2 lb

## 2016-09-29 DIAGNOSIS — J301 Allergic rhinitis due to pollen: Secondary | ICD-10-CM

## 2016-09-29 DIAGNOSIS — E1142 Type 2 diabetes mellitus with diabetic polyneuropathy: Secondary | ICD-10-CM

## 2016-09-29 DIAGNOSIS — M5416 Radiculopathy, lumbar region: Secondary | ICD-10-CM | POA: Diagnosis not present

## 2016-09-29 MED ORDER — GABAPENTIN 600 MG PO TABS
600.0000 mg | ORAL_TABLET | Freq: Every day | ORAL | 5 refills | Status: DC
Start: 2016-09-29 — End: 2017-04-08

## 2016-09-29 NOTE — Patient Instructions (Signed)
Great to see you!  For your nose: Try plain zyrtec or allegra 1 pill once daily, or try flonase 2 sprays per nostril once daily ( you may ultimately need both)  Increase gabapentin to 400 mg to a few nights, then 500 mg then 600 mg ( I have sent in 600 mg to [place])  Come back after 5/22 for an a1c for a lab appointment only  Follow up in 4 months

## 2016-09-29 NOTE — Progress Notes (Signed)
   HPI  Patient presents today here to follow-up for chronic medical conditions.  Diabetes Average fasting is 130 to 150 Patient is watching his back, he is carefully logging his sugars.  Allergies Patient has had difficult time with stuffy nose, specifically with using his new CPAP machine. He has not tried medications, his wife uses Flonase.  Radiculopathy Patient has stable to worsening left-sided lumbar back pain with radiculopathy, he has had an MRI with his neurosurgeon, gabapentin is not helping much yet. He does not have any sedation with 300 mg but did at first.   PMH: Smoking status noted ROS: Per HPI  Objective: BP 139/77   Pulse (!) 54   Temp 97.4 F (36.3 C) (Oral)   Ht 6\' 5"  (1.956 m)   Wt (!) 356 lb 3.2 oz (161.6 kg)   BMI 42.24 kg/m  Gen: NAD, alert, cooperative with exam HEENT: NCAT CV: RRR, good S1/S2, no murmur Resp: CTABL, no wheezes, non-labored Ext: AFO present on LLE Neuro: Alert and oriented, No gross deficits  Assessment and plan:  type 2 diabetes Previously well-controlled, repeat A1c after 3 months, approximately one week. I expect a well-controlled A1c  # Allergic rhinitis Plain Zyrtec or Flonase, discussed at length May need to try an alternative mask with CPAP machine.   # Lumbar radiculopathy Titrating gabapentin, encouraged him to contact his neurosurgeon for follow-up Pain stable to slightly worse    Orders Placed This Encounter  Procedures  . Bayer DCA Hb A1c Waived    Standing Status:   Future    Standing Expiration Date:   09/29/2017    Meds ordered this encounter  Medications  . gabapentin (NEURONTIN) 600 MG tablet    Sig: Take 1 tablet (600 mg total) by mouth at bedtime.    Dispense:  30 tablet    Refill:  [place], MD [place] 09/29/2016, 8:44 AM

## 2016-11-26 ENCOUNTER — Other Ambulatory Visit: Payer: Self-pay | Admitting: Urology

## 2016-11-26 DIAGNOSIS — C641 Malignant neoplasm of right kidney, except renal pelvis: Secondary | ICD-10-CM

## 2016-12-09 ENCOUNTER — Ambulatory Visit (HOSPITAL_COMMUNITY)
Admission: RE | Admit: 2016-12-09 | Discharge: 2016-12-09 | Disposition: A | Discharge status: Home / Self Care | Payer: Medicaid Other | Source: Ambulatory Visit | Attending: Urology | Admitting: Urology

## 2016-12-09 DIAGNOSIS — C641 Malignant neoplasm of right kidney, except renal pelvis: Secondary | ICD-10-CM

## 2016-12-09 DIAGNOSIS — Z905 Acquired absence of kidney: Secondary | ICD-10-CM | POA: Insufficient documentation

## 2016-12-09 DIAGNOSIS — K760 Fatty (change of) liver, not elsewhere classified: Secondary | ICD-10-CM | POA: Diagnosis not present

## 2016-12-09 DIAGNOSIS — N281 Cyst of kidney, acquired: Secondary | ICD-10-CM | POA: Diagnosis not present

## 2016-12-09 DIAGNOSIS — Z85528 Personal history of other malignant neoplasm of kidney: Secondary | ICD-10-CM | POA: Insufficient documentation

## 2016-12-09 DIAGNOSIS — I700 Atherosclerosis of aorta: Secondary | ICD-10-CM | POA: Insufficient documentation

## 2016-12-09 DIAGNOSIS — Z9889 Other specified postprocedural states: Secondary | ICD-10-CM | POA: Insufficient documentation

## 2016-12-09 MED ORDER — IOPAMIDOL (ISOVUE-300) INJECTION 61%
100.0000 mL | Freq: Once | INTRAVENOUS | Status: AC | PRN
  Administered 2016-12-09: 100 mL via INTRAVENOUS

## 2016-12-10 ENCOUNTER — Ambulatory Visit (INDEPENDENT_AMBULATORY_CARE_PROVIDER_SITE_OTHER): Payer: Medicaid Other | Admitting: Urology

## 2016-12-10 DIAGNOSIS — C641 Malignant neoplasm of right kidney, except renal pelvis: Secondary | ICD-10-CM

## 2016-12-15 ENCOUNTER — Ambulatory Visit (INDEPENDENT_AMBULATORY_CARE_PROVIDER_SITE_OTHER): Payer: Medicaid Other | Admitting: Family Medicine

## 2016-12-15 ENCOUNTER — Encounter: Payer: Self-pay | Admitting: Family Medicine

## 2016-12-15 VITALS — BP 162/84 | HR 57 | Temp 97.2°F | Ht 77.0 in | Wt 350.0 lb

## 2016-12-15 DIAGNOSIS — M5416 Radiculopathy, lumbar region: Secondary | ICD-10-CM | POA: Diagnosis not present

## 2016-12-15 DIAGNOSIS — I10 Essential (primary) hypertension: Secondary | ICD-10-CM

## 2016-12-15 MED ORDER — PREDNISONE 20 MG PO TABS
ORAL_TABLET | ORAL | 0 refills | Status: DC
Start: 2016-12-15 — End: 2017-05-05

## 2016-12-15 MED ORDER — OXYCODONE-ACETAMINOPHEN 5-325 MG PO TABS: 1.0000 | ORAL_TABLET | Freq: Four times a day (QID) | ORAL | 0 refills | Status: DC | PRN

## 2016-12-15 NOTE — Patient Instructions (Signed)
Great to see you!  Do not drive after taking percocet, come back with any concerns

## 2016-12-15 NOTE — Progress Notes (Signed)
   HPI  Patient presents today here for back pain.  She explains he has had back pain more severe than usual for about 2-3 weeks. He continues to have chronic left-sided low back pain and numbness of the left leg. He has seen neurosurgery and discussed the possibility of surgery with them. They are not convinced it would be beneficial.  He states that he been using Tylenol with good effect for about 2 weeks when he hit a bump on his riding lawnmower on Saturday causing severe left-sided low back pain. He describes severe sharp pains with certain positional movements.  Percocet previously has helped. He is status post nephrectomy 1 and has been avoiding NSAIDs to avoid renal stress.  He has diabetes, his previous A1c was 6.6.  PMH: Smoking status noted ROS: Per HPI  Objective: BP (!) 162/84   Pulse (!) 57   Temp (!) 97.2 F (36.2 C) (Oral)   Ht 6\' 5"  (1.956 m)   Wt (!) 350 lb (158.8 kg)   BMI 41.50 kg/m  Gen: NAD, alert, cooperative with exam HEENT: NCAT, EOMI, PERRL CV: RRR, good S1/S2, no murmur Resp: CTABL, no wheezes, non-labored Ext: No edema, warm Neuro: Alert and oriented, No gross deficits MSK:  Negative straight leg raise, mild tenderness to palpation of left-sided lower back over paraspinal muscles on the left in the lumbar area, no midline tenderness  Vitals - 1 value per visit 09/29/2016 09/15/2016 08/07/2016 1/60/1093 06/22/5571  SYSTOLIC 220 254 270 623   DIASTOLIC 77 86 77 90    Vitals - 1 value per visit 06/18/2016 7/62/8315  SYSTOLIC  176  DIASTOLIC  77    Assessment and plan:  # Acute  on chronic back pain, radiculopathy Exacerbation currently Short course of percocet + prednisone,Discussed glucose would suffer.  Discussed short term only narcotics No NSAIDs currently given nephrectomy.   # HTN Elevated, alsogh usually reasonable Expect worse due to pain, pt will monitor closely.     Meds ordered this encounter  Medications  . diazepam (VALIUM)  5 MG tablet    Sig: Take 5 mg by mouth at bedtime.    Refill:  0  . oxyCODONE-acetaminophen (ROXICET) 5-325 MG tablet    Sig: Take 1 tablet by mouth every 6 (six) hours as needed for severe pain.    Dispense:  20 tablet    Refill:  0  . predniSONE (DELTASONE) 20 MG tablet    Sig: 2 po at sametime daily for 5 days    Dispense:  10 tablet    Refill:  0    Laroy Apple, MD [facility] 12/15/2016, 10:03 AM

## 2017-01-10 ENCOUNTER — Other Ambulatory Visit: Payer: Self-pay | Admitting: Family Medicine

## 2017-01-12 ENCOUNTER — Encounter: Payer: Self-pay | Admitting: Pediatrics

## 2017-01-12 ENCOUNTER — Ambulatory Visit (INDEPENDENT_AMBULATORY_CARE_PROVIDER_SITE_OTHER): Payer: Medicaid Other | Admitting: Pediatrics

## 2017-01-12 VITALS — BP 139/88 | HR 61 | Temp 98.0°F | Ht 77.0 in | Wt 357.2 lb

## 2017-01-12 DIAGNOSIS — E119 Type 2 diabetes mellitus without complications: Secondary | ICD-10-CM | POA: Diagnosis not present

## 2017-01-12 DIAGNOSIS — I10 Essential (primary) hypertension: Secondary | ICD-10-CM

## 2017-01-12 MED ORDER — SITAGLIPTIN PHOSPHATE 100 MG PO TABS: 100.0000 mg | ORAL_TABLET | Freq: Every day | ORAL | 1 refills | Status: DC

## 2017-01-12 NOTE — Progress Notes (Signed)
  Subjective:   Patient ID: Anthony Logan, male    DOB: Feb 08, 1961, 56 y.o.   MRN: [id number] CC: Elevated BS  HPI: Anthony Logan is a 56 y.o. male presenting for Elevated BS  DM2: BGLs have been elevated for past 3 weeks Started on prednisone then for radiculopathy of lumbar BGLs before then were 160s, not over 200 For past two weeks 200s-300s AM fasting BGLs 345, 308, 218  Have a prescription for metformin, says it ran BGLs up so he stopped  Cut out sodas completely a while ago Sometimes has diet drinks  Not eating a lot of sugar  No CP, SOB Taking meds regularly  Relevant past medical, surgical, family and social history reviewed. Allergies and medications reviewed and updated. History  Smoking Status  . Former Smoker  . Years: 35.00  . Types: Cigarettes  . Quit date: 02/14/2012  Smokeless Tobacco  . Never Used    Comment: smokes about 4 per day recently   ROS: Per HPI   Objective:    BP 139/88   Pulse 61   Temp 98 F (36.7 C) (Oral)   Ht _0  (1.956 m)   Wt (!) 357 lb 3.2 oz (162 kg)   BMI 42.36 kg/m   Wt Readings from Last 3 Encounters:  01/12/17 (!) 357 lb 3.2 oz (162 kg)  12/15/16 (!) 350 lb (158.8 kg)  09/29/16 (!) 356 lb 3.2 oz (161.6 kg)    Gen: NAD, alert, cooperative with exam, NCAT EYES: EOMI, no conjunctival injection, or no icterus CV: NRRR, normal S1/S2, no murmur, distal pulses 2+ b/l Resp: CTABL, no wheezes, normal WOB Abd: +BS, soft, NTND. Ext: No edema, warm Neuro: Alert and oriented MSK: normal muscle bulk  Assessment & Plan:  Anthony Logan was seen today for elevated bs.  Diagnoses and all orders for this visit:  Type 2 diabetes mellitus without complication, without long-term current use of insulin (HCC) start januvia Cont to check BGLs Follow up as scheduled with PCP -     sitaGLIPtin ([name]) 100 MG tablet; Take 1 tablet (100 mg total) by mouth daily. -     Bayer DCA Hb A1c Waived -     BMP8+EGFR  Essential hypertension,  benign Slightly elevated Cont current meds, check BPs when able  Follow up plan: As scheduled Assunta Found, MD [place]

## 2017-01-21 ENCOUNTER — Other Ambulatory Visit: Payer: Self-pay | Admitting: Family Medicine

## 2017-02-03 ENCOUNTER — Ambulatory Visit (INDEPENDENT_AMBULATORY_CARE_PROVIDER_SITE_OTHER): Payer: Medicaid Other | Admitting: Family Medicine

## 2017-02-03 ENCOUNTER — Encounter: Payer: Self-pay | Admitting: Family Medicine

## 2017-02-03 VITALS — BP 152/79 | HR 57 | Temp 97.2°F | Ht 77.0 in | Wt 361.0 lb

## 2017-02-03 DIAGNOSIS — E11621 Type 2 diabetes mellitus with foot ulcer: Secondary | ICD-10-CM | POA: Diagnosis not present

## 2017-02-03 DIAGNOSIS — G4733 Obstructive sleep apnea (adult) (pediatric): Secondary | ICD-10-CM

## 2017-02-03 DIAGNOSIS — L97529 Non-pressure chronic ulcer of other part of left foot with unspecified severity: Secondary | ICD-10-CM

## 2017-02-03 DIAGNOSIS — E1142 Type 2 diabetes mellitus with diabetic polyneuropathy: Secondary | ICD-10-CM | POA: Diagnosis not present

## 2017-02-03 LAB — BAYER DCA HB A1C WAIVED: HB A1C (BAYER DCA - WAIVED): 8.1 % — ABNORMAL HIGH (ref <7.0)

## 2017-02-03 NOTE — Patient Instructions (Signed)
Great to see you!  Come back to see me in 3 months  We will refer you to [facility]

## 2017-02-03 NOTE — Progress Notes (Signed)
   HPI  Patient presents today for follow-up of chronic medical conditions.  Diabetes Good medication compliance Blood sugar fasting is average 150-175 No hypoglycemia. Watching diet well currently, however for about 2 months out of the last 3 he did not watch his diet at all.  Diabetic toe ulcer, patient states that it's been present for probably about 30 years, states that he dresses it daily and that it's doing better than it was previously. Would like to see wound care at [facility].  Obstructive sleep apnea Patient has difficulty tolerating the mask, he would like to consider seeing a specialist for this, however he declines actual referral today.   PMH: Smoking status noted ROS: Per HPI  Objective: BP (!) 152/79   Pulse (!) 57   Temp (!) 97.2 F (36.2 C) (Oral)   Ht 6\' 5"  (1.956 m)   Wt (!) 361 lb (163.7 kg)   BMI 42.81 kg/m  Gen: NAD, alert, cooperative with exam HEENT: NCAT, EOMI, PERRL CV: RRR, good S1/S2, no murmur Resp: CTABL, no wheezes, non-labored Abd: SNTND, BS present, no guarding or organomegaly Ext: No edema, warm Neuro: Alert and oriented, No gross deficits Skin:  L Great toe with shallow1.7 cm X 1.6 cm ulcer   Assessment and plan:  # T2DM Uncontrolled, A1C 8.1 Continue current meds for now, I believe he has good room for improvement with his diet F/u 3 months  # Diabetic toe ulcer Refer to wound care per his preference No sign of active infection.   # OSA Not tolerating CPAP, discussed nasal pillows He declines referral to pulm for now.     Orders Placed This Encounter  Procedures  . AMB referral to wound care center    Referral Priority:   Routine    Referral Type:   Consultation    Number of Visits Requested:   [place], MD [facility] 02/03/2017, 11:53 AM

## 2017-02-21 ENCOUNTER — Other Ambulatory Visit: Payer: Self-pay | Admitting: Family Medicine

## 2017-02-27 ENCOUNTER — Ambulatory Visit (INDEPENDENT_AMBULATORY_CARE_PROVIDER_SITE_OTHER): Payer: Medicaid Other

## 2017-02-27 DIAGNOSIS — Z23 Encounter for immunization: Secondary | ICD-10-CM | POA: Diagnosis not present

## 2017-03-20 LAB — HM DIABETES EYE EXAM

## 2017-04-05 ENCOUNTER — Other Ambulatory Visit: Payer: Self-pay | Admitting: Pediatrics

## 2017-04-05 DIAGNOSIS — E119 Type 2 diabetes mellitus without complications: Secondary | ICD-10-CM

## 2017-04-08 ENCOUNTER — Other Ambulatory Visit: Payer: Self-pay | Admitting: Family Medicine

## 2017-04-10 ENCOUNTER — Other Ambulatory Visit: Payer: Self-pay | Admitting: Family Medicine

## 2017-05-05 ENCOUNTER — Encounter: Payer: Self-pay | Admitting: Family Medicine

## 2017-05-05 ENCOUNTER — Ambulatory Visit: Payer: 59 | Admitting: Family Medicine

## 2017-05-05 VITALS — BP 126/72 | HR 55 | Temp 96.4°F | Ht 77.0 in | Wt 349.0 lb

## 2017-05-05 DIAGNOSIS — E1142 Type 2 diabetes mellitus with diabetic polyneuropathy: Secondary | ICD-10-CM | POA: Diagnosis not present

## 2017-05-05 DIAGNOSIS — M545 Low back pain, unspecified: Secondary | ICD-10-CM

## 2017-05-05 DIAGNOSIS — M109 Gout, unspecified: Secondary | ICD-10-CM

## 2017-05-05 LAB — BAYER DCA HB A1C WAIVED: HB A1C: 6.4 % (ref <7.0)

## 2017-05-05 MED ORDER — COLCHICINE 0.6 MG PO TABS: 0.6000 mg | ORAL_TABLET | Freq: Every day | ORAL | 1 refills | Status: DC

## 2017-05-05 MED ORDER — PREDNISONE 10 MG PO TABS: ORAL_TABLET | ORAL | 0 refills | Status: DC

## 2017-05-05 MED ORDER — ALLOPURINOL 100 MG PO TABS: ORAL_TABLET | ORAL | 0 refills | Status: DC

## 2017-05-05 MED ORDER — CYCLOBENZAPRINE HCL 10 MG PO TABS: 10.0000 mg | ORAL_TABLET | Freq: Three times a day (TID) | ORAL | 0 refills | Status: DC | PRN

## 2017-05-05 MED ORDER — ALLOPURINOL 300 MG PO TABS: 300.0000 mg | ORAL_TABLET | Freq: Every day | ORAL | 6 refills | Status: DC

## 2017-05-05 MED ORDER — OXYCODONE-ACETAMINOPHEN 5-325 MG PO TABS: 1.0000 | ORAL_TABLET | Freq: Four times a day (QID) | ORAL | 0 refills | Status: DC | PRN

## 2017-05-05 NOTE — Patient Instructions (Addendum)
Great to see you!  For Your back Start prednisone, take oxycodone as needed, take Flexeril primarily at night, this may make you very sleepy.  For gout. Take colchicine daily for 2 months while starting allopurinol. Start allopurinol as described on the bottle, 100 mg daily for 2 weeks, then 200 mg daily, then switch to 300 mg. Come back for labs in about 3 weeks while starting allopurinol to make sure that your kidney and liver are tolerating the medication well

## 2017-05-05 NOTE — Progress Notes (Signed)
HPI  Patient presents today here for follow-up chronic medical conditions as well as acute back pain.  Acute midline low back pain. Patient has been found to have multiple bulging lumbar disks on an MRI in December 2017.  He has had intermittent flares, however none as severe as this. He was trying to shovel snow and had sudden onset midline low back pain during the exercise. He is tried Percocet with no improvement.  Type 2 diabetes Fasting blood sugar ranging 140-170. Good medication compliance. Using weight watchers to watch his diet.  Gout Patient would like to try allopurinol, he is using colchicine about once a month.   PMH: Smoking status noted ROS: Per HPI  Objective: BP 126/72   Pulse (!) 55   Temp (!) 96.4 F (35.8 C) (Oral)   Ht _0  (1.956 m)   Wt (!) 349 lb (158.3 kg)   BMI 41.39 kg/m  Gen: NAD, alert, cooperative with exam HEENT: NCAT, EOMI, PERRL CV: RRR, good S1/S2, no murmur Resp: CTABL, no wheezes, non-labored Ext: No edema, warm Neuro: Alert and oriented, No gross deficits MSK:  NO TTP of BL paraspinal muscles, + midline ttp that is mild  Assessment and plan:  # Low back pain, w/o sciatica  History of bulging disc, discussed reasons to see his neurosurgeon encouraged him to follow-up if not improving within 1 week. No red flags. Prednisone for anti-inflammatory, avoiding NSAIDs with only one kidney. Flexeril at night, Percocet needed  #Type 2 diabetes Well-controlled Discussed medications will be better covered with his new insurance.  #Gout Monthly symptoms, starting allopurinol, check uric acid today Discussed possible reduced renal function, rechecking renal function in about 3 weeks, 1 week of treatment with 200 mg. Treatment with colchicine for 2 months.  Recommended waiting about 2 weeks to start gout treatment considering his back flare and multiple medications for this.   Orders Placed This Encounter  Procedures  . Bayer DCA  Hb A1c Waived  . Uric acid  . CMP14+EGFR  . CBC with Differential/Platelet  . Lipid panel  . CMP14+EGFR    Standing Status:   Future    Standing Expiration Date:   05/05/2018    Meds ordered this encounter  Medications  . allopurinol (ZYLOPRIM) 100 MG tablet    Sig: Tab daily for 2 weeks, then 2 tabs daily for 2 weeks, then change to 300 mg tablets    Dispense:  42 tablet    Refill:  0  . allopurinol (ZYLOPRIM) 300 MG tablet    Sig: Take 1 tablet (300 mg total) by mouth daily.    Dispense:  30 tablet    Refill:  6  . colchicine 0.6 MG tablet    Sig: Take 1 tablet (0.6 mg total) by mouth daily. While starting allopurinol    Dispense:  30 tablet    Refill:  1  . predniSONE (DELTASONE) 10 MG tablet    Sig: Take 4 pills a day for 3 days, then 3 pills a day for 3 days, then 2 pills a day for 3 days, then 1 pill a day for 3 days, then stop    Dispense:  30 tablet    Refill:  0  . cyclobenzaprine (FLEXERIL) 10 MG tablet    Sig: Take 1 tablet (10 mg total) by mouth 3 (three) times daily as needed for muscle spasms.    Dispense:  30 tablet    Refill:  0  . oxyCODONE-acetaminophen (ROXICET) 5-325 MG tablet  Sig: Take 1 tablet by mouth every 6 (six) hours as needed for severe pain.    Dispense:  20 tablet    Refill:  0    Laroy Apple, MD [facility] 05/05/2017, 10:01 AM

## 2017-05-06 LAB — CBC WITH DIFFERENTIAL/PLATELET
BASOS ABS: 0.1 10*3/uL (ref 0.0–0.2)
Basos: 1 %
EOS (ABSOLUTE): 0.3 10*3/uL (ref 0.0–0.4)
Eos: 3 %
Hematocrit: 45.1 % (ref 37.5–51.0)
Hemoglobin: 14.6 g/dL (ref 13.0–17.7)
IMMATURE GRANS (ABS): 0 10*3/uL (ref 0.0–0.1)
IMMATURE GRANULOCYTES: 0 %
LYMPHS: 25 %
Lymphocytes Absolute: 2.6 10*3/uL (ref 0.7–3.1)
MCH: 30.7 pg (ref 26.6–33.0)
MCHC: 32.4 g/dL (ref 31.5–35.7)
MCV: 95 fL (ref 79–97)
Monocytes Absolute: 0.9 10*3/uL (ref 0.1–0.9)
Monocytes: 8 %
NEUTROS PCT: 63 %
Neutrophils Absolute: 6.6 10*3/uL (ref 1.4–7.0)
PLATELETS: 282 10*3/uL (ref 150–379)
RBC: 4.76 x10E6/uL (ref 4.14–5.80)
RDW: 13.3 % (ref 12.3–15.4)
WBC: 10.6 10*3/uL (ref 3.4–10.8)

## 2017-05-06 LAB — CMP14+EGFR
A/G RATIO: 1.5 (ref 1.2–2.2)
ALT: 20 IU/L (ref 0–44)
AST: 20 IU/L (ref 0–40)
Albumin: 4.1 g/dL (ref 3.5–5.5)
Alkaline Phosphatase: 60 IU/L (ref 39–117)
BUN/Creatinine Ratio: 16 (ref 9–20)
BUN: 20 mg/dL (ref 6–24)
Bilirubin Total: 0.6 mg/dL (ref 0.0–1.2)
CALCIUM: 9.4 mg/dL (ref 8.7–10.2)
CHLORIDE: 96 mmol/L (ref 96–106)
CO2: 30 mmol/L — ABNORMAL HIGH (ref 20–29)
Creatinine, Ser: 1.28 mg/dL — ABNORMAL HIGH (ref 0.76–1.27)
GFR calc Af Amer: 72 mL/min/{1.73_m2} (ref >59)
GFR, EST NON AFRICAN AMERICAN: 62 mL/min/{1.73_m2} (ref >59)
Globulin, Total: 2.8 g/dL (ref 1.5–4.5)
Glucose: 225 mg/dL — ABNORMAL HIGH (ref 65–99)
POTASSIUM: 4.1 mmol/L (ref 3.5–5.2)
Sodium: 142 mmol/L (ref 134–144)
Total Protein: 6.9 g/dL (ref 6.0–8.5)

## 2017-05-06 LAB — LIPID PANEL
CHOLESTEROL TOTAL: 125 mg/dL (ref 100–199)
Chol/HDL Ratio: 5.2 ratio — ABNORMAL HIGH (ref 0.0–5.0)
HDL: 24 mg/dL — AB (ref >39)
LDL Calculated: 42 mg/dL (ref 0–99)
Triglycerides: 293 mg/dL — ABNORMAL HIGH (ref 0–149)
VLDL CHOLESTEROL CAL: 59 mg/dL — AB (ref 5–40)

## 2017-05-06 LAB — URIC ACID: URIC ACID: 10.8 mg/dL — AB (ref 3.7–8.6)

## 2017-05-08 ENCOUNTER — Other Ambulatory Visit: Payer: Self-pay | Admitting: Family Medicine

## 2017-05-08 DIAGNOSIS — E119 Type 2 diabetes mellitus without complications: Secondary | ICD-10-CM

## 2017-05-13 DIAGNOSIS — L97528 Non-pressure chronic ulcer of other part of left foot with other specified severity: Secondary | ICD-10-CM | POA: Diagnosis not present

## 2017-05-13 DIAGNOSIS — L97521 Non-pressure chronic ulcer of other part of left foot limited to breakdown of skin: Secondary | ICD-10-CM | POA: Diagnosis not present

## 2017-05-13 DIAGNOSIS — E11621 Type 2 diabetes mellitus with foot ulcer: Secondary | ICD-10-CM | POA: Diagnosis not present

## 2017-06-08 ENCOUNTER — Other Ambulatory Visit: Payer: Self-pay | Admitting: Family Medicine

## 2017-06-09 ENCOUNTER — Other Ambulatory Visit: Payer: Self-pay | Admitting: Urology

## 2017-06-09 ENCOUNTER — Ambulatory Visit (HOSPITAL_COMMUNITY)
Admission: RE | Admit: 2017-06-09 | Discharge: 2017-06-09 | Disposition: A | Discharge status: Home / Self Care | Payer: 59 | Source: Ambulatory Visit | Attending: Urology | Admitting: Urology

## 2017-06-09 DIAGNOSIS — I517 Cardiomegaly: Secondary | ICD-10-CM | POA: Diagnosis not present

## 2017-06-09 DIAGNOSIS — C641 Malignant neoplasm of right kidney, except renal pelvis: Secondary | ICD-10-CM

## 2017-06-09 DIAGNOSIS — C649 Malignant neoplasm of unspecified kidney, except renal pelvis: Secondary | ICD-10-CM | POA: Diagnosis not present

## 2017-06-10 DIAGNOSIS — L97529 Non-pressure chronic ulcer of other part of left foot with unspecified severity: Secondary | ICD-10-CM | POA: Diagnosis not present

## 2017-06-10 DIAGNOSIS — E11621 Type 2 diabetes mellitus with foot ulcer: Secondary | ICD-10-CM | POA: Diagnosis not present

## 2017-06-10 DIAGNOSIS — L97521 Non-pressure chronic ulcer of other part of left foot limited to breakdown of skin: Secondary | ICD-10-CM | POA: Diagnosis not present

## 2017-06-16 ENCOUNTER — Other Ambulatory Visit: Payer: Self-pay | Admitting: Family Medicine

## 2017-06-17 ENCOUNTER — Ambulatory Visit: Payer: 59 | Admitting: Urology

## 2017-06-17 DIAGNOSIS — C641 Malignant neoplasm of right kidney, except renal pelvis: Secondary | ICD-10-CM

## 2017-06-23 ENCOUNTER — Ambulatory Visit: Payer: 59 | Admitting: Family Medicine

## 2017-06-25 ENCOUNTER — Ambulatory Visit: Payer: 59 | Admitting: Family Medicine

## 2017-06-25 ENCOUNTER — Encounter: Payer: Self-pay | Admitting: Family Medicine

## 2017-06-25 VITALS — BP 127/79 | HR 72 | Temp 97.8°F | Ht 77.0 in | Wt 343.0 lb

## 2017-06-25 DIAGNOSIS — M109 Gout, unspecified: Secondary | ICD-10-CM | POA: Diagnosis not present

## 2017-06-25 DIAGNOSIS — M75101 Unspecified rotator cuff tear or rupture of right shoulder, not specified as traumatic: Secondary | ICD-10-CM | POA: Diagnosis not present

## 2017-06-25 MED ORDER — CYCLOBENZAPRINE HCL 10 MG PO TABS: 10.0000 mg | ORAL_TABLET | Freq: Every day | ORAL | 1 refills | Status: DC

## 2017-06-25 NOTE — Progress Notes (Signed)
   HPI  Patient presents today here to follow-up for gout and right shoulder pain.  Gout Fal[place] recently, pt feels allopurinol is not working and has stopped the med.  Has used an OTC supplement, he will return with details.  His symptoms have improved over the last 2-3 days and a new supplement.  Right shoulder pain Started about 6 weeks ago. States that he had aching persistent shoulder pain with intermittent sharp pains that improved whenever his gout flare came on. He states that it started back up as his gout has gotten better. Denies any injury  PMH: Smoking status noted ROS: Per HPI  Objective: BP 127/79   Pulse 72   Temp 97.8 F (36.6 C) (Oral)   Ht 6\' 5"  (1.956 m)   Wt (!) 343 lb (155.6 kg)   BMI 40.67 kg/m  Gen: NAD, alert, cooperative with exam HEENT: NCAT CV: RRR, good S1/S2, no murmur Resp: CTABL, no wheezes, non-labored Ext: No edema, warm Neuro: Alert and oriented, No gross deficits  MSK:  Right shoulder with preserved strength, positive Hawkins sign with posterior shoulder pain, negative empty can sign  Assessment and plan:  #Gout It is difficult that he has stopped allopurinol, he could really benefit from this of properly titrated, however he does not feel that is helping him despite discussion. He is doing well with his current supplement, we could consider Uloric in the future  #Rotator cuff syndrome Right sided rotator cuff syndrome, discussed conservative treatment with Flexeril, Tylenol, ice May warrant a cortisone injection Encouragement provided   Meds ordered this encounter  Medications  . cyclobenzaprine (FLEXERIL) 10 MG tablet    Sig: Take 1 tablet (10 mg total) by mouth at bedtime.    Dispense:  30 tablet    Refill:  [place], MD [place] 06/25/2017, 3:56 PM

## 2017-06-25 NOTE — Patient Instructions (Signed)
Great to see you!  Try flexeril, ice and tylenol for your shoulder. You may need a cortisone injection in your shoulder. ( this would make your sugars go up)  For your Gout we will check your kidney function next month and consider more meds ( like uloric) if you do not get good relief with the supplement.

## 2017-07-01 DIAGNOSIS — E1140 Type 2 diabetes mellitus with diabetic neuropathy, unspecified: Secondary | ICD-10-CM | POA: Diagnosis not present

## 2017-07-01 DIAGNOSIS — L97523 Non-pressure chronic ulcer of other part of left foot with necrosis of muscle: Secondary | ICD-10-CM | POA: Diagnosis not present

## 2017-07-01 DIAGNOSIS — E11621 Type 2 diabetes mellitus with foot ulcer: Secondary | ICD-10-CM | POA: Diagnosis not present

## 2017-07-01 DIAGNOSIS — L97528 Non-pressure chronic ulcer of other part of left foot with other specified severity: Secondary | ICD-10-CM | POA: Diagnosis not present

## 2017-07-07 DIAGNOSIS — L97528 Non-pressure chronic ulcer of other part of left foot with other specified severity: Secondary | ICD-10-CM | POA: Diagnosis not present

## 2017-07-07 DIAGNOSIS — L97523 Non-pressure chronic ulcer of other part of left foot with necrosis of muscle: Secondary | ICD-10-CM | POA: Diagnosis not present

## 2017-07-07 DIAGNOSIS — E11621 Type 2 diabetes mellitus with foot ulcer: Secondary | ICD-10-CM | POA: Diagnosis not present

## 2017-07-07 DIAGNOSIS — E1140 Type 2 diabetes mellitus with diabetic neuropathy, unspecified: Secondary | ICD-10-CM | POA: Diagnosis not present

## 2017-07-15 DIAGNOSIS — E11621 Type 2 diabetes mellitus with foot ulcer: Secondary | ICD-10-CM | POA: Diagnosis not present

## 2017-07-15 DIAGNOSIS — L97523 Non-pressure chronic ulcer of other part of left foot with necrosis of muscle: Secondary | ICD-10-CM | POA: Diagnosis not present

## 2017-08-03 ENCOUNTER — Other Ambulatory Visit: Payer: Self-pay | Admitting: Family Medicine

## 2017-08-03 DIAGNOSIS — E119 Type 2 diabetes mellitus without complications: Secondary | ICD-10-CM

## 2017-08-03 NOTE — Telephone Encounter (Signed)
OV 08/05/17

## 2017-08-05 ENCOUNTER — Ambulatory Visit: Payer: 59 | Admitting: Family Medicine

## 2017-08-05 ENCOUNTER — Encounter: Payer: Self-pay | Admitting: Family Medicine

## 2017-08-05 VITALS — BP 122/73 | HR 56 | Temp 97.5°F | Ht 77.0 in | Wt 337.4 lb

## 2017-08-05 DIAGNOSIS — M25572 Pain in left ankle and joints of left foot: Secondary | ICD-10-CM | POA: Diagnosis not present

## 2017-08-05 DIAGNOSIS — E1142 Type 2 diabetes mellitus with diabetic polyneuropathy: Secondary | ICD-10-CM

## 2017-08-05 DIAGNOSIS — I10 Essential (primary) hypertension: Secondary | ICD-10-CM

## 2017-08-05 DIAGNOSIS — E119 Type 2 diabetes mellitus without complications: Secondary | ICD-10-CM

## 2017-08-05 DIAGNOSIS — M25571 Pain in right ankle and joints of right foot: Secondary | ICD-10-CM

## 2017-08-05 LAB — BAYER DCA HB A1C WAIVED: HB A1C (BAYER DCA - WAIVED): 6.1 % (ref <7.0)

## 2017-08-05 MED ORDER — SITAGLIPTIN PHOSPHATE 100 MG PO TABS: 100.0000 mg | ORAL_TABLET | Freq: Every day | ORAL | 3 refills | Status: DC

## 2017-08-05 MED ORDER — FUROSEMIDE 20 MG PO TABS: ORAL_TABLET | ORAL | 3 refills | Status: DC

## 2017-08-05 MED ORDER — ALLOPURINOL 300 MG PO TABS: 300.0000 mg | ORAL_TABLET | Freq: Every day | ORAL | 3 refills | Status: DC

## 2017-08-05 MED ORDER — HYDROCHLOROTHIAZIDE 25 MG PO TABS: 25.0000 mg | ORAL_TABLET | Freq: Every day | ORAL | 3 refills | Status: DC

## 2017-08-05 MED ORDER — GABAPENTIN 600 MG PO TABS: 600.0000 mg | ORAL_TABLET | Freq: Every day | ORAL | 3 refills | Status: DC

## 2017-08-05 MED ORDER — METOPROLOL TARTRATE 100 MG PO TABS: 200.0000 mg | ORAL_TABLET | Freq: Two times a day (BID) | ORAL | 3 refills | Status: DC

## 2017-08-05 MED ORDER — BUPROPION HCL ER (XL) 300 MG PO TB24: 300.0000 mg | ORAL_TABLET | Freq: Every day | ORAL | 3 refills | Status: DC

## 2017-08-05 MED ORDER — LISINOPRIL 30 MG PO TABS: 30.0000 mg | ORAL_TABLET | Freq: Every day | ORAL | 3 refills | Status: DC

## 2017-08-05 MED ORDER — HYDRALAZINE HCL 25 MG PO TABS: 25.0000 mg | ORAL_TABLET | Freq: Three times a day (TID) | ORAL | 3 refills | Status: DC

## 2017-08-05 NOTE — Progress Notes (Signed)
   HPI  Patient presents today for follow-up.  Rotator cuff syndrome-resolved.  Type 2 diabetes Good medication compliance, patient is watching diet closely, no lows. Taking [name] Patient is also on weight watchers, lost 25 pounds so far. Fasting blood sugar ranges 120-130.  Ankle pain. Patient states 2-3 weeks he has had bilateral ankle pain, started after using new orthotics from podiatry, he has changed back to his regular insoles and gotten new shoes and states that it is improving.  There is still bothering him but not as severely.  PMH: Smoking status noted ROS: Per HPI  Objective: BP 122/73   Pulse (!) 56   Temp (!) 97.5 F (36.4 C) (Oral)   Ht 6\' 5"  (1.956 m)   Wt (!) 337 lb 6.4 oz (153 kg)   BMI 40.01 kg/m  Gen: NAD, alert, cooperative with exam HEENT: NCAT CV: RRR, good S1/S2, no murmur Resp: CTABL, no wheezes, non-labored Ext: No edema, warm Neuro: Alert and oriented, No gross deficits Diabetic Foot Exam - Simple   Simple Foot Form Diabetic Foot exam was performed with the following findings:  Yes 08/05/2017  9:02 AM  Visual Inspection See comments:  Yes Sensation Testing See comments:  Yes Pulse Check Posterior Tibialis and Dorsalis pulse intact bilaterally:  Yes Comments Sensation absent to monofilament on left heel, callus on right great toe that appears to be healing      Assessment and plan:  #Type 2 diabetes Well controlled, A1c 6.1 Congratulated on weight loss, continue [name].  #Hypertension Well-controlled, continue HCTZ and lisinopril, also beta-blocker  #Ankle pain Possibly secondary to starting new orthotics, he will follow-up with podiatry and discussed with them, for now he is doing better without the orthotics Return to clinic with any concerns, consider sports med if needed  Orders Placed This Encounter  Procedures  . Microalbumin / creatinine urine ratio  . Bayer [facility] Hb A1c Carney Bern, MD [facility] 08/05/2017, 9:03 AM

## 2017-08-06 ENCOUNTER — Other Ambulatory Visit: Payer: Self-pay | Admitting: Family Medicine

## 2017-08-06 LAB — MICROALBUMIN / CREATININE URINE RATIO
CREATININE, UR: 152.7 mg/dL
MICROALBUM., U, RANDOM: 143.4 ug/mL
Microalb/Creat Ratio: 93.9 mg/g creat — ABNORMAL HIGH (ref 0.0–30.0)

## 2017-09-02 ENCOUNTER — Encounter: Payer: Self-pay | Admitting: Family Medicine

## 2017-09-02 ENCOUNTER — Ambulatory Visit: Payer: 59 | Admitting: Family Medicine

## 2017-09-02 VITALS — BP 140/74 | HR 54 | Temp 97.7°F | Ht 77.0 in | Wt 334.0 lb

## 2017-09-02 DIAGNOSIS — J019 Acute sinusitis, unspecified: Secondary | ICD-10-CM

## 2017-09-02 DIAGNOSIS — R52 Pain, unspecified: Secondary | ICD-10-CM

## 2017-09-02 LAB — VERITOR FLU A/B WAIVED
INFLUENZA A: NEGATIVE
Influenza B: NEGATIVE

## 2017-09-02 MED ORDER — BENZONATATE 200 MG PO CAPS: 200.0000 mg | ORAL_CAPSULE | Freq: Two times a day (BID) | ORAL | 0 refills | Status: DC | PRN

## 2017-09-02 MED ORDER — AMOXICILLIN-POT CLAVULANATE 875-125 MG PO TABS: 1.0000 | ORAL_TABLET | Freq: Two times a day (BID) | ORAL | 0 refills | Status: AC
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
## 2017-09-02 NOTE — Patient Instructions (Addendum)
I think this is an allergic sinusitis.  Nothing on your exam appears bacterial.  Your flu test was negative.  We discussed continuing the Flonase nasal spray.  Add an oral antihistamine like Zyrtec, Allegra or Claritin.  I would recommend doing sinus rinses.  Again make sure that you are using distilled water with this.  If your symptoms are not improving by Sunday, I have given you an antibiotic that you may fill.  Hold off until Sunday before you fill this.  I have also sent you in a nonsedating cough medication.  You may use this twice a day if needed.  It is okay to drive while on this medicine if you need to.   Sinus Rinse What is a sinus rinse? A sinus rinse is a home treatment. It rinses your sinuses with a mixture of salt and water (saline solution). Sinuses are air-filled spaces in your skull behind the bones of your face and forehead. They open into your nasal cavity. To do a sinus rinse, you will need:  Saline solution.  Neti pot or spray bottle. This releases the saline solution into your nose and through your sinuses. You can buy neti pots and spray bottles at: ? [employer]. ? A health food store. ? Online.  When should I do a sinus rinse? A sinus rinse can help to clear your nasal cavity. It can clear:  Mucus.  Dirt.  Dust.  Pollen.  You may do a sinus rinse when you have:  A cold.  A virus.  Allergies.  A sinus infection.  A stuffy nose.  If you are considering a sinus rinse:  Ask your child's doctor before doing a sinus rinse on your child.  Do not do a sinus rinse if you have had: ? Ear or nasal surgery. ? An ear infection. ? Blocked ears.  How do I do a sinus rinse?  Wash your hands.  Disinfect your device using the directions that came with the device.  Dry your device.  Use the solution that comes with your device or one that is sold separately in stores. Follow the mixing directions on the package.  Fill your device with the  amount of saline solution as stated in the device instructions.  Stand over a sink and tilt your head sideways over the sink.  Place the spout of the device in your upper nostril (the one closer to the ceiling).  Gently pour or squeeze the saline solution into the nasal cavity. The liquid should drain to the lower nostril if you are not too congested.  Gently blow your nose. Blowing too hard may cause ear pain.  Repeat in the other nostril.  Clean and rinse your device with clean water.  Air-dry your device. Are there risks of a sinus rinse? Sinus rinse is normally very safe and helpful. However, there are a few risks, which include:  A burning feeling in the sinuses. This may happen if you do not make the saline solution as instructed. Make sure to follow all directions when making the saline solution.  Infection from unclean water. This is rare, but possible.  Nasal irritation.  This information is not intended to replace advice given to you by your health care provider. Make sure you discuss any questions you have with your health care provider. Document Released: 11/30/2013 Document Revised: 04/01/2016 Document Reviewed: 09/20/2013 Elsevier Interactive Patient Education  2017 Elsevier Inc.  Sinusitis, Adult Sinusitis is soreness and inflammation of your sinuses. Sinuses  are hollow spaces in the bones around your face. They are located:  Around your eyes.  In the middle of your forehead.  Behind your nose.  In your cheekbones.  Your sinuses and nasal passages are lined with a stringy fluid (mucus). Mucus normally drains out of your sinuses. When your nasal tissues get inflamed or swollen, the mucus can get trapped or blocked so air cannot flow through your sinuses. This lets bacteria, viruses, and funguses grow, and that leads to infection. Follow these instructions at home: Medicines  Take, use, or apply over-the-counter and prescription medicines only as told by your  doctor. These may include nasal sprays.  If you were prescribed an antibiotic medicine, take it as told by your doctor. Do not stop taking the antibiotic even if you start to feel better. Hydrate and Humidify  Drink enough water to keep your pee (urine) clear or pale yellow.  Use a cool mist humidifier to keep the humidity level in your home above 50%.  Breathe in steam for 10-15 minutes, 3-4 times a day or as told by your doctor. You can do this in the bathroom while a hot shower is running.  Try not to spend time in cool or dry air. Rest  Rest as much as possible.  Sleep with your head raised (elevated).  Make sure to get enough sleep each night. General instructions  Put a warm, moist washcloth on your face 3-4 times a day or as told by your doctor. This will help with discomfort.  Wash your hands often with soap and water. If there is no soap and water, use hand sanitizer.  Do not smoke. Avoid being around people who are smoking (secondhand smoke).  Keep all follow-up visits as told by your doctor. This is important. Contact a doctor if:  You have a fever.  Your symptoms get worse.  Your symptoms do not get better within 10 days. Get help right away if:  You have a very bad headache.  You cannot stop throwing up (vomiting).  You have pain or swelling around your face or eyes.  You have trouble seeing.  You feel confused.  Your neck is stiff.  You have trouble breathing. This information is not intended to replace advice given to you by your health care provider. Make sure you discuss any questions you have with your health care provider. Document Released: 10/22/2007 Document Revised: 12/30/2015 Document Reviewed: 02/28/2015 Elsevier Interactive Patient Education  [employer].

## 2017-11-06 ENCOUNTER — Ambulatory Visit (INDEPENDENT_AMBULATORY_CARE_PROVIDER_SITE_OTHER): Payer: 59 | Admitting: Family Medicine

## 2017-11-06 ENCOUNTER — Encounter: Payer: Self-pay | Admitting: Family Medicine

## 2017-11-06 VITALS — BP 113/66 | HR 54 | Temp 97.3°F | Ht 77.0 in | Wt 335.6 lb

## 2017-11-06 DIAGNOSIS — I10 Essential (primary) hypertension: Secondary | ICD-10-CM

## 2017-11-06 DIAGNOSIS — E119 Type 2 diabetes mellitus without complications: Secondary | ICD-10-CM | POA: Diagnosis not present

## 2017-11-06 DIAGNOSIS — K5900 Constipation, unspecified: Secondary | ICD-10-CM | POA: Diagnosis not present

## 2017-11-06 DIAGNOSIS — Z1211 Encounter for screening for malignant neoplasm of colon: Secondary | ICD-10-CM

## 2017-11-06 LAB — BMP8+EGFR
BUN / CREAT RATIO: 19 (ref 9–20)
BUN: 23 mg/dL (ref 6–24)
CHLORIDE: 95 mmol/L — AB (ref 96–106)
CO2: 28 mmol/L (ref 20–29)
Calcium: 9.7 mg/dL (ref 8.7–10.2)
Creatinine, Ser: 1.2 mg/dL (ref 0.76–1.27)
GFR calc non Af Amer: 67 mL/min/{1.73_m2} (ref >59)
GFR, EST AFRICAN AMERICAN: 78 mL/min/{1.73_m2} (ref >59)
Glucose: 214 mg/dL — ABNORMAL HIGH (ref 65–99)
POTASSIUM: 3.7 mmol/L (ref 3.5–5.2)
SODIUM: 139 mmol/L (ref 134–144)

## 2017-11-06 LAB — BAYER DCA HB A1C WAIVED: HB A1C: 6 % (ref <7.0)

## 2017-11-06 MED ORDER — CYCLOBENZAPRINE HCL 10 MG PO TABS: 10.0000 mg | ORAL_TABLET | Freq: Every day | ORAL | 5 refills | Status: DC

## 2017-11-06 MED ORDER — COLCHICINE 0.6 MG PO CAPS: ORAL_CAPSULE | ORAL | 2 refills | Status: DC

## 2017-11-06 MED ORDER — METOPROLOL TARTRATE 100 MG PO TABS: 200.0000 mg | ORAL_TABLET | Freq: Two times a day (BID) | ORAL | 3 refills | Status: DC

## 2017-11-06 MED ORDER — ALLOPURINOL 300 MG PO TABS: 300.0000 mg | ORAL_TABLET | Freq: Every day | ORAL | 3 refills | Status: DC

## 2017-11-06 MED ORDER — LISINOPRIL 30 MG PO TABS: 30.0000 mg | ORAL_TABLET | Freq: Every day | ORAL | 3 refills | Status: DC

## 2017-11-06 MED ORDER — BUPROPION HCL ER (XL) 300 MG PO TB24: 300.0000 mg | ORAL_TABLET | Freq: Every day | ORAL | 3 refills | Status: DC

## 2017-11-06 MED ORDER — HYDROCHLOROTHIAZIDE 25 MG PO TABS: 25.0000 mg | ORAL_TABLET | Freq: Every day | ORAL | 3 refills | Status: DC

## 2017-11-06 MED ORDER — HYDRALAZINE HCL 25 MG PO TABS: 25.0000 mg | ORAL_TABLET | Freq: Three times a day (TID) | ORAL | 3 refills | Status: DC

## 2017-11-06 MED ORDER — SITAGLIPTIN PHOSPHATE 100 MG PO TABS: 100.0000 mg | ORAL_TABLET | Freq: Every day | ORAL | 3 refills | Status: DC

## 2017-11-06 MED ORDER — GABAPENTIN 600 MG PO TABS: 600.0000 mg | ORAL_TABLET | Freq: Every day | ORAL | 3 refills | Status: DC

## 2017-11-06 MED ORDER — FUROSEMIDE 20 MG PO TABS: ORAL_TABLET | ORAL | 3 refills | Status: DC

## 2017-11-06 NOTE — Patient Instructions (Signed)
Great to see you!  Come back to see Dr. Gottschalk in 3 months 

## 2017-11-06 NOTE — Progress Notes (Signed)
HPI  Patient presents today for follow-up chronic medical conditions.  Type 2 diabetes Average fasting blood sugar is 1 30-1 50 No hypoglycemia Good medication compliance Watching diet well Relatively active.  Hypertension Good medication compliance No headache or chest pain.  Patient would like to try Cologuard for colon cancer screening  PMH: Smoking status noted ROS: Per HPI  Objective: BP 113/66   Pulse (!) 54   Temp (!) 97.3 F (36.3 C) (Oral)   Ht 6' 5"  (1.956 m)   Wt (!) 335 lb 9.6 oz (152.2 kg)   BMI 39.80 kg/m  Gen: NAD, alert, cooperative with exam HEENT: NCAT CV: RRR, good S1/S2, no murmur Resp: CTABL, no wheezes, non-labored Ext: L sided AFO in place Neuro: Alert and oriented, No gross deficits  Assessment and plan:  #type 2 diabetes Expect good control, A1c pending Continue current medications  #Hypertension Well-controlled No changes Labs  #Screening for colon cancer-Cologuard  #Constipation Oh by the way explanation, patient is going a few hours later than usual, not responding well to stool softeners Still having 1 stool a day which is more firm and harder to come by Adding Benefiber  Orders Placed This Encounter  Procedures  . Bayer DCA Hb A1c Waived  . Cologuard  . BMP8+EGFR    Meds ordered this encounter  Medications  . sitaGLIPtin ([name]) 100 MG tablet    Sig: Take 1 tablet (100 mg total) by mouth daily.    Dispense:  90 tablet    Refill:  3    This prescription was filled on 07/14/2017. Any refills authorized will be placed on file.  Marland Kitchen allopurinol (ZYLOPRIM) 300 MG tablet    Sig: Take 1 tablet (300 mg total) by mouth daily.    Dispense:  90 tablet    Refill:  3  . buPROPion (WELLBUTRIN XL) 300 MG 24 hr tablet    Sig: Take 1 tablet (300 mg total) by mouth daily.    Dispense:  90 tablet    Refill:  3  . Colchicine 0.6 MG CAPS    Sig: TAKE ONE CAPSULE BY MOUTH EVERY DAY WHILE STARTING ALLOPURINOL    Dispense:  30  capsule    Refill:  2    This prescription was filled on 05/19/2017. Any refills authorized will be placed on file.  . furosemide (LASIX) 20 MG tablet    Sig: TAKE TWO TABLETS BY MOUTH EVERY MORNING AND TAKE 1 TABLET BY MOUTH EVERY EVENING    Dispense:  270 tablet    Refill:  3  . gabapentin (NEURONTIN) 600 MG tablet    Sig: Take 1 tablet (600 mg total) by mouth at bedtime.    Dispense:  90 tablet    Refill:  3    This prescription was filled on 05/19/2017. Any refills authorized will be placed on file.  . hydrALAZINE (APRESOLINE) 25 MG tablet    Sig: Take 1 tablet (25 mg total) by mouth 3 (three) times daily.    Dispense:  270 tablet    Refill:  3  . hydrochlorothiazide (HYDRODIURIL) 25 MG tablet    Sig: Take 1 tablet (25 mg total) by mouth daily.    Dispense:  90 tablet    Refill:  3  . lisinopril (PRINIVIL,ZESTRIL) 30 MG tablet    Sig: Take 1 tablet (30 mg total) by mouth daily.    Dispense:  90 tablet    Refill:  3  . metoprolol tartrate (LOPRESSOR) 100 MG tablet  Sig: Take 2 tablets (200 mg total) by mouth 2 (two) times daily.    Dispense:  360 tablet    Refill:  3  . cyclobenzaprine (FLEXERIL) 10 MG tablet    Sig: Take 1 tablet (10 mg total) by mouth at bedtime.    Dispense:  30 tablet    Refill:  5    This prescription was filled on 07/17/2017. Any refills authorized will be placed on file.    Laroy Apple, MD [facility] 11/06/2017, 8:17 AM

## 2017-11-09 ENCOUNTER — Other Ambulatory Visit: Payer: Self-pay | Admitting: Urology

## 2017-11-09 DIAGNOSIS — C641 Malignant neoplasm of right kidney, except renal pelvis: Secondary | ICD-10-CM

## 2017-11-24 DIAGNOSIS — Z1211 Encounter for screening for malignant neoplasm of colon: Secondary | ICD-10-CM | POA: Diagnosis not present

## 2017-11-24 DIAGNOSIS — Z1212 Encounter for screening for malignant neoplasm of rectum: Secondary | ICD-10-CM | POA: Diagnosis not present

## 2017-12-09 ENCOUNTER — Ambulatory Visit (HOSPITAL_COMMUNITY)
Admission: RE | Admit: 2017-12-09 | Discharge: 2017-12-09 | Disposition: A | Discharge status: Home / Self Care | Payer: 59 | Source: Ambulatory Visit | Attending: Urology | Admitting: Urology

## 2017-12-09 DIAGNOSIS — N281 Cyst of kidney, acquired: Secondary | ICD-10-CM | POA: Insufficient documentation

## 2017-12-09 DIAGNOSIS — C641 Malignant neoplasm of right kidney, except renal pelvis: Secondary | ICD-10-CM | POA: Diagnosis not present

## 2017-12-09 MED ORDER — IOPAMIDOL (ISOVUE-300) INJECTION 61%
100.0000 mL | Freq: Once | INTRAVENOUS | Status: AC | PRN
  Administered 2017-12-09: 100 mL via INTRAVENOUS

## 2017-12-14 ENCOUNTER — Other Ambulatory Visit: Payer: Self-pay | Admitting: Family Medicine

## 2017-12-16 ENCOUNTER — Ambulatory Visit (INDEPENDENT_AMBULATORY_CARE_PROVIDER_SITE_OTHER): Payer: 59 | Admitting: Urology

## 2017-12-16 DIAGNOSIS — C641 Malignant neoplasm of right kidney, except renal pelvis: Secondary | ICD-10-CM | POA: Diagnosis not present

## 2018-02-08 ENCOUNTER — Ambulatory Visit: Payer: 59 | Admitting: Family Medicine

## 2018-02-12 ENCOUNTER — Ambulatory Visit (INDEPENDENT_AMBULATORY_CARE_PROVIDER_SITE_OTHER): Payer: 59 | Admitting: Family Medicine

## 2018-02-12 ENCOUNTER — Encounter: Payer: Self-pay | Admitting: Family Medicine

## 2018-02-12 VITALS — BP 152/92 | HR 63 | Temp 97.2°F | Ht 77.0 in | Wt 344.0 lb

## 2018-02-12 DIAGNOSIS — M21372 Foot drop, left foot: Secondary | ICD-10-CM

## 2018-02-12 DIAGNOSIS — G629 Polyneuropathy, unspecified: Secondary | ICD-10-CM

## 2018-02-12 DIAGNOSIS — M109 Gout, unspecified: Secondary | ICD-10-CM

## 2018-02-12 DIAGNOSIS — E119 Type 2 diabetes mellitus without complications: Secondary | ICD-10-CM

## 2018-02-12 DIAGNOSIS — E11621 Type 2 diabetes mellitus with foot ulcer: Secondary | ICD-10-CM

## 2018-02-12 DIAGNOSIS — Z23 Encounter for immunization: Secondary | ICD-10-CM | POA: Diagnosis not present

## 2018-02-12 DIAGNOSIS — I10 Essential (primary) hypertension: Secondary | ICD-10-CM

## 2018-02-12 DIAGNOSIS — E1159 Type 2 diabetes mellitus with other circulatory complications: Secondary | ICD-10-CM

## 2018-02-12 DIAGNOSIS — Z6841 Body Mass Index (BMI) 40.0 and over, adult: Secondary | ICD-10-CM | POA: Diagnosis not present

## 2018-02-12 DIAGNOSIS — I152 Hypertension secondary to endocrine disorders: Secondary | ICD-10-CM

## 2018-02-12 DIAGNOSIS — L97522 Non-pressure chronic ulcer of other part of left foot with fat layer exposed: Secondary | ICD-10-CM

## 2018-02-12 LAB — BAYER DCA HB A1C WAIVED: HB A1C (BAYER DCA - WAIVED): 6.3 % (ref <7.0)

## 2018-02-12 MED ORDER — SITAGLIPTIN PHOSPHATE 100 MG PO TABS: 100.0000 mg | ORAL_TABLET | Freq: Every day | ORAL | 3 refills | Status: DC

## 2018-02-12 MED ORDER — COLCHICINE 0.6 MG PO CAPS
ORAL_CAPSULE | ORAL | 2 refills | Status: DC
Start: 2018-02-12 — End: 2018-07-27
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
## 2018-02-13 LAB — BASIC METABOLIC PANEL
BUN / CREAT RATIO: 17 (ref 9–20)
BUN: 20 mg/dL (ref 6–24)
CHLORIDE: 94 mmol/L — AB (ref 96–106)
CO2: 29 mmol/L (ref 20–29)
Calcium: 9.6 mg/dL (ref 8.7–10.2)
Creatinine, Ser: 1.21 mg/dL (ref 0.76–1.27)
GFR, EST AFRICAN AMERICAN: 77 mL/min/{1.73_m2} (ref >59)
GFR, EST NON AFRICAN AMERICAN: 67 mL/min/{1.73_m2} (ref >59)
Glucose: 140 mg/dL — ABNORMAL HIGH (ref 65–99)
Potassium: 3.8 mmol/L (ref 3.5–5.2)
Sodium: 140 mmol/L (ref 134–144)

## 2018-02-13 LAB — CBC WITH DIFFERENTIAL/PLATELET
BASOS: 1 %
Basophils Absolute: 0.1 10*3/uL (ref 0.0–0.2)
EOS (ABSOLUTE): 0.4 10*3/uL (ref 0.0–0.4)
Eos: 4 %
HEMATOCRIT: 44.9 % (ref 37.5–51.0)
Hemoglobin: 15.1 g/dL (ref 13.0–17.7)
Immature Grans (Abs): 0 10*3/uL (ref 0.0–0.1)
Immature Granulocytes: 0 %
Lymphocytes Absolute: 2.8 10*3/uL (ref 0.7–3.1)
Lymphs: 28 %
MCH: 31.9 pg (ref 26.6–33.0)
MCHC: 33.6 g/dL (ref 31.5–35.7)
MCV: 95 fL (ref 79–97)
MONOS ABS: 1 10*3/uL — AB (ref 0.1–0.9)
Monocytes: 10 %
NEUTROS ABS: 5.8 10*3/uL (ref 1.4–7.0)
Neutrophils: 57 %
Platelets: 295 10*3/uL (ref 150–450)
RBC: 4.73 x10E6/uL (ref 4.14–5.80)
RDW: 13.5 % (ref 12.3–15.4)
WBC: 10 10*3/uL (ref 3.4–10.8)

## 2018-02-18 DIAGNOSIS — L97528 Non-pressure chronic ulcer of other part of left foot with other specified severity: Secondary | ICD-10-CM | POA: Diagnosis not present

## 2018-02-18 DIAGNOSIS — L97529 Non-pressure chronic ulcer of other part of left foot with unspecified severity: Secondary | ICD-10-CM | POA: Diagnosis not present

## 2018-03-01 ENCOUNTER — Telehealth: Payer: Self-pay | Admitting: Family Medicine

## 2018-03-01 DIAGNOSIS — E119 Type 2 diabetes mellitus without complications: Secondary | ICD-10-CM

## 2018-03-01 MED ORDER — SITAGLIPTIN PHOSPHATE 100 MG PO TABS: 100.0000 mg | ORAL_TABLET | Freq: Every day | ORAL | 3 refills | Status: DC

## 2018-03-01 NOTE — Telephone Encounter (Signed)
Patient states that his insurance wants him to use [facility] and would not let him get it filled at [facility]- rx sent

## 2018-03-29 DIAGNOSIS — L97522 Non-pressure chronic ulcer of other part of left foot with fat layer exposed: Secondary | ICD-10-CM | POA: Diagnosis not present

## 2018-03-29 DIAGNOSIS — M21372 Foot drop, left foot: Secondary | ICD-10-CM | POA: Diagnosis not present

## 2018-03-29 DIAGNOSIS — E11621 Type 2 diabetes mellitus with foot ulcer: Secondary | ICD-10-CM | POA: Diagnosis not present

## 2018-05-17 ENCOUNTER — Encounter: Payer: Self-pay | Admitting: Family Medicine

## 2018-05-17 ENCOUNTER — Ambulatory Visit (INDEPENDENT_AMBULATORY_CARE_PROVIDER_SITE_OTHER): Payer: 59 | Admitting: Family Medicine

## 2018-05-17 VITALS — BP 151/85 | HR 77 | Temp 97.8°F | Ht >= 80 in | Wt 352.0 lb

## 2018-05-17 DIAGNOSIS — G2581 Restless legs syndrome: Secondary | ICD-10-CM | POA: Diagnosis not present

## 2018-05-17 DIAGNOSIS — E1159 Type 2 diabetes mellitus with other circulatory complications: Secondary | ICD-10-CM | POA: Diagnosis not present

## 2018-05-17 DIAGNOSIS — I152 Hypertension secondary to endocrine disorders: Secondary | ICD-10-CM

## 2018-05-17 DIAGNOSIS — E119 Type 2 diabetes mellitus without complications: Secondary | ICD-10-CM

## 2018-05-17 DIAGNOSIS — Z23 Encounter for immunization: Secondary | ICD-10-CM

## 2018-05-17 DIAGNOSIS — I10 Essential (primary) hypertension: Secondary | ICD-10-CM | POA: Diagnosis not present

## 2018-05-17 LAB — BAYER DCA HB A1C WAIVED: HB A1C (BAYER DCA - WAIVED): 8.5 % — ABNORMAL HIGH (ref <7.0)

## 2018-05-17 MED ORDER — LISINOPRIL 40 MG PO TABS: 40.0000 mg | ORAL_TABLET | Freq: Every day | ORAL | 3 refills | Status: DC

## 2018-05-17 MED ORDER — ROPINIROLE HCL 0.25 MG PO TABS: 0.2500 mg | ORAL_TABLET | Freq: Every day | ORAL | 0 refills | Status: DC

## 2018-05-17 MED ORDER — CYCLOBENZAPRINE HCL 10 MG PO TABS: 10.0000 mg | ORAL_TABLET | Freq: Every day | ORAL | 5 refills | Status: DC

## 2018-05-17 MED ORDER — LISINOPRIL 40 MG PO TABS: 40.0000 mg | ORAL_TABLET | Freq: Every day | ORAL | 0 refills | Status: DC

## 2018-05-17 NOTE — Patient Instructions (Signed)
We are starting Requip for restless legs. See me in 1 month for recheck. We have increased your lisinopril to 40 mg daily. Monitor blood pressure and work on diet.  Your hemoglobin A1c was too high today.  I know that you can be controlled on just [name] because you are a 6.3 at last visit.  We will hold off on any medication changes for now and recheck in 3 months.
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
## 2018-06-10 DIAGNOSIS — E11319 Type 2 diabetes mellitus with unspecified diabetic retinopathy without macular edema: Secondary | ICD-10-CM | POA: Diagnosis not present

## 2018-06-10 LAB — HM DIABETES EYE EXAM

## 2018-06-18 ENCOUNTER — Encounter: Payer: Self-pay | Admitting: Family Medicine

## 2018-06-18 ENCOUNTER — Ambulatory Visit (INDEPENDENT_AMBULATORY_CARE_PROVIDER_SITE_OTHER): Payer: 59 | Admitting: Family Medicine

## 2018-06-18 VITALS — BP 135/80 | HR 62 | Temp 97.0°F | Ht 77.0 in | Wt 342.0 lb

## 2018-06-18 DIAGNOSIS — G2581 Restless legs syndrome: Secondary | ICD-10-CM | POA: Diagnosis not present

## 2018-06-18 DIAGNOSIS — I10 Essential (primary) hypertension: Secondary | ICD-10-CM

## 2018-06-18 DIAGNOSIS — E1159 Type 2 diabetes mellitus with other circulatory complications: Secondary | ICD-10-CM | POA: Diagnosis not present

## 2018-06-18 DIAGNOSIS — I700 Atherosclerosis of aorta: Secondary | ICD-10-CM

## 2018-06-18 DIAGNOSIS — I77810 Thoracic aortic ectasia: Secondary | ICD-10-CM

## 2018-06-18 DIAGNOSIS — N2889 Other specified disorders of kidney and ureter: Secondary | ICD-10-CM | POA: Diagnosis not present

## 2018-06-18 DIAGNOSIS — I152 Hypertension secondary to endocrine disorders: Secondary | ICD-10-CM

## 2018-06-18 MED ORDER — CYCLOBENZAPRINE HCL 10 MG PO TABS: 10.0000 mg | ORAL_TABLET | Freq: Every day | ORAL | 1 refills | Status: DC

## 2018-06-18 NOTE — Patient Instructions (Addendum)
Follow up in March for sugar.  You had labs performed today.  You will be contacted with the results of the labs once they are available, usually in the next 3 business days for routine lab work.  If you had a pap smear or biopsy performed, expect to be contacted in about 7-10 days.  Let me know if you end up wanting to see the Neurologist about your feet.
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
## 2018-06-19 LAB — BASIC METABOLIC PANEL
BUN/Creatinine Ratio: 14 (ref 9–20)
BUN: 17 mg/dL (ref 6–24)
CALCIUM: 10 mg/dL (ref 8.7–10.2)
CHLORIDE: 98 mmol/L (ref 96–106)
CO2: 23 mmol/L (ref 20–29)
Creatinine, Ser: 1.25 mg/dL (ref 0.76–1.27)
GFR calc Af Amer: 73 mL/min/{1.73_m2} (ref >59)
GFR, EST NON AFRICAN AMERICAN: 64 mL/min/{1.73_m2} (ref >59)
GLUCOSE: 223 mg/dL — AB (ref 65–99)
POTASSIUM: 3.7 mmol/L (ref 3.5–5.2)
Sodium: 146 mmol/L — ABNORMAL HIGH (ref 134–144)

## 2018-06-23 ENCOUNTER — Ambulatory Visit (HOSPITAL_COMMUNITY)
Admission: RE | Admit: 2018-06-23 | Discharge: 2018-06-23 | Disposition: A | Discharge status: Home / Self Care | Payer: 59 | Source: Ambulatory Visit | Attending: Urology | Admitting: Urology

## 2018-06-23 ENCOUNTER — Other Ambulatory Visit (HOSPITAL_COMMUNITY): Payer: Self-pay | Admitting: Urology

## 2018-06-23 DIAGNOSIS — C641 Malignant neoplasm of right kidney, except renal pelvis: Secondary | ICD-10-CM | POA: Insufficient documentation

## 2018-06-23 DIAGNOSIS — R079 Chest pain, unspecified: Secondary | ICD-10-CM | POA: Diagnosis not present

## 2018-06-30 ENCOUNTER — Ambulatory Visit (INDEPENDENT_AMBULATORY_CARE_PROVIDER_SITE_OTHER): Payer: 59 | Admitting: Urology

## 2018-06-30 DIAGNOSIS — C641 Malignant neoplasm of right kidney, except renal pelvis: Secondary | ICD-10-CM | POA: Diagnosis not present

## 2018-07-26 ENCOUNTER — Encounter: Payer: Self-pay | Admitting: Cardiology
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
## 2018-07-27 ENCOUNTER — Telehealth: Payer: Self-pay | Admitting: Cardiology

## 2018-07-27 ENCOUNTER — Ambulatory Visit (INDEPENDENT_AMBULATORY_CARE_PROVIDER_SITE_OTHER): Payer: 59 | Admitting: Cardiology

## 2018-07-27 ENCOUNTER — Encounter: Payer: Self-pay | Admitting: Cardiology

## 2018-07-27 VITALS — BP 150/98 | HR 68 | Ht 77.0 in | Wt 348.0 lb

## 2018-07-27 DIAGNOSIS — I10 Essential (primary) hypertension: Secondary | ICD-10-CM | POA: Diagnosis not present

## 2018-07-27 DIAGNOSIS — R0602 Shortness of breath: Secondary | ICD-10-CM | POA: Diagnosis not present

## 2018-07-27 DIAGNOSIS — I351 Nonrheumatic aortic (valve) insufficiency: Secondary | ICD-10-CM | POA: Diagnosis not present

## 2018-07-27 DIAGNOSIS — I77810 Thoracic aortic ectasia: Secondary | ICD-10-CM

## 2018-07-27 NOTE — Patient Instructions (Addendum)
Medication Instructions:   Your physician recommends that you continue on your current medications as directed. Please refer to the Current Medication list given to you today.  Labwork:  Your physician recommends that you return for lab work in: prior to Chest CTA to check your BMET.  Testing/Procedures: Chest CTA-Non-Cardiac CT scanning, (CAT scanning), is a noninvasive, special x-ray that produces cross-sectional images of the body using x-rays and a computer. CT scans help physicians diagnose and treat medical conditions. For some CT exams, a contrast material is used to enhance visibility in the area of the body being studied. CT scans provide greater clarity and reveal more details than regular x-ray exams. Your physician has requested that you have an echocardiogram. Echocardiography is a painless test that uses sound waves to create images of your heart. It provides your doctor with information about the size and shape of your heart and how well your heart's chambers and valves are working. This procedure takes approximately one hour. There are no restrictions for this procedure.  Follow-Up:  Your physician recommends that you schedule a follow-up appointment in: pending test results.   Any Other Special Instructions Will Be Listed Below (If Applicable).  If you need a refill on your cardiac medications before your next appointment, please call your pharmacy.

## 2018-07-27 NOTE — Telephone Encounter (Signed)
Pre-cert Verification for the following procedure    CT Angio Chest scheduled for 08/03/2018 at [facility]

## 2018-07-28 DIAGNOSIS — R809 Proteinuria, unspecified: Secondary | ICD-10-CM | POA: Diagnosis not present

## 2018-07-28 DIAGNOSIS — N183 Chronic kidney disease, stage 3 (moderate): Secondary | ICD-10-CM | POA: Diagnosis not present

## 2018-07-28 DIAGNOSIS — D509 Iron deficiency anemia, unspecified: Secondary | ICD-10-CM | POA: Diagnosis not present

## 2018-07-28 DIAGNOSIS — R0602 Shortness of breath: Secondary | ICD-10-CM | POA: Diagnosis not present

## 2018-07-28 LAB — BASIC METABOLIC PANEL
BUN: 21 mg/dL (ref 7–25)
CALCIUM: 9.8 mg/dL (ref 8.6–10.3)
CO2: 32 mmol/L (ref 20–32)
Chloride: 98 mmol/L (ref 98–110)
Creat: 1.09 mg/dL (ref 0.70–1.33)
GLUCOSE: 191 mg/dL — AB (ref 65–139)
POTASSIUM: 3.7 mmol/L (ref 3.5–5.3)
SODIUM: 140 mmol/L (ref 135–146)

## 2018-08-05 ENCOUNTER — Telehealth: Payer: Self-pay | Admitting: *Deleted

## 2018-08-05 ENCOUNTER — Other Ambulatory Visit: Payer: Self-pay

## 2018-08-05 ENCOUNTER — Ambulatory Visit (INDEPENDENT_AMBULATORY_CARE_PROVIDER_SITE_OTHER): Payer: 59

## 2018-08-05 DIAGNOSIS — R0602 Shortness of breath: Secondary | ICD-10-CM | POA: Diagnosis not present

## 2018-08-05 NOTE — Telephone Encounter (Signed)
-----   Message from Erma Heritage, [place] sent at 08/05/2018  1:37 PM EDT ----- Covering for Dr. Domenic Polite - Please let the patient know his echocardiogram showed normal pumping function of the heart with a preserved EF of 60-65%. No wall motion abnormalities. Aortic regurgitation (leakage along the valve) remains moderate which is similar to prior imaging in 2016. Overall, no significant change. Please forward a copy to [place] M, DO. Thank you.

## 2018-08-05 NOTE — Telephone Encounter (Signed)
Patient informed. Copy sent to PCP °

## 2018-08-06 ENCOUNTER — Other Ambulatory Visit: Payer: Self-pay | Admitting: Family Medicine

## 2018-08-06 NOTE — Telephone Encounter (Signed)
Last seen 06/18/2018

## 2018-08-10 ENCOUNTER — Telehealth: Payer: Self-pay | Admitting: *Deleted

## 2018-08-10 ENCOUNTER — Other Ambulatory Visit: Payer: Self-pay

## 2018-08-10 ENCOUNTER — Ambulatory Visit (HOSPITAL_COMMUNITY)
Admission: RE | Admit: 2018-08-10 | Discharge: 2018-08-10 | Disposition: A | Discharge status: Home / Self Care | Payer: 59 | Source: Ambulatory Visit | Attending: Cardiology | Admitting: Cardiology

## 2018-08-10 DIAGNOSIS — I77810 Thoracic aortic ectasia: Secondary | ICD-10-CM | POA: Diagnosis not present

## 2018-08-10 DIAGNOSIS — I712 Thoracic aortic aneurysm, without rupture: Secondary | ICD-10-CM | POA: Diagnosis not present

## 2018-08-10 MED ORDER — IOHEXOL 350 MG/ML SOLN
100.0000 mL | Freq: Once | INTRAVENOUS | Status: AC | PRN
  Administered 2018-08-10: 100 mL via INTRAVENOUS

## 2018-08-10 NOTE — Telephone Encounter (Signed)
Patient informed and verbalized understanding of plan. 

## 2018-08-10 NOTE — Telephone Encounter (Signed)
-----   Message from Satira Sark, MD sent at 08/10/2018 10:00 AM EDT ----- Results reviewed.  Ascending thoracic aorta measures 4.4 cm, mildly aneurysmal but no larger than prior assessment.  He will need an annual follow-up for this, please schedule a one-year visit from his recent office encounter and we can arrange testing at that time.  Incidentally noted left thyroid goiter reportedly similar in appearance to previous testing, forward result to PCP. A copy of this test should be forwarded to [name], DO.

## 2018-08-20 ENCOUNTER — Encounter: Payer: Self-pay | Admitting: Family Medicine

## 2018-09-03 ENCOUNTER — Other Ambulatory Visit: Payer: Self-pay | Admitting: *Deleted

## 2018-09-03 DIAGNOSIS — E119 Type 2 diabetes mellitus without complications: Secondary | ICD-10-CM

## 2018-09-03 MED ORDER — SITAGLIPTIN PHOSPHATE 100 MG PO TABS: 100.0000 mg | ORAL_TABLET | Freq: Every day | ORAL | 0 refills | Status: DC

## 2018-09-20 ENCOUNTER — Ambulatory Visit (INDEPENDENT_AMBULATORY_CARE_PROVIDER_SITE_OTHER): Payer: 59 | Admitting: Family Medicine

## 2018-09-20 ENCOUNTER — Other Ambulatory Visit: Payer: Self-pay

## 2018-09-20 DIAGNOSIS — I152 Hypertension secondary to endocrine disorders: Secondary | ICD-10-CM

## 2018-09-20 DIAGNOSIS — F32A Depression, unspecified: Secondary | ICD-10-CM

## 2018-09-20 DIAGNOSIS — I10 Essential (primary) hypertension: Secondary | ICD-10-CM

## 2018-09-20 DIAGNOSIS — E782 Mixed hyperlipidemia: Secondary | ICD-10-CM

## 2018-09-20 DIAGNOSIS — E1159 Type 2 diabetes mellitus with other circulatory complications: Secondary | ICD-10-CM

## 2018-09-20 DIAGNOSIS — E1142 Type 2 diabetes mellitus with diabetic polyneuropathy: Secondary | ICD-10-CM | POA: Diagnosis not present

## 2018-09-20 DIAGNOSIS — F329 Major depressive disorder, single episode, unspecified: Secondary | ICD-10-CM

## 2018-09-20 MED ORDER — GABAPENTIN 600 MG PO TABS: 600.0000 mg | ORAL_TABLET | Freq: Every day | ORAL | 3 refills | Status: DC

## 2018-09-20 MED ORDER — METOPROLOL TARTRATE 100 MG PO TABS: 200.0000 mg | ORAL_TABLET | Freq: Two times a day (BID) | ORAL | 3 refills | Status: DC

## 2018-09-20 MED ORDER — HYDRALAZINE HCL 25 MG PO TABS: 25.0000 mg | ORAL_TABLET | Freq: Three times a day (TID) | ORAL | 3 refills | Status: DC

## 2018-09-20 MED ORDER — HYDROCHLOROTHIAZIDE 25 MG PO TABS: 25.0000 mg | ORAL_TABLET | Freq: Every day | ORAL | 3 refills | Status: DC

## 2018-09-20 MED ORDER — FUROSEMIDE 20 MG PO TABS: 40.0000 mg | ORAL_TABLET | Freq: Every day | ORAL | 2 refills | Status: DC

## 2018-09-20 MED ORDER — BUPROPION HCL ER (XL) 300 MG PO TB24: 300.0000 mg | ORAL_TABLET | Freq: Every day | ORAL | 3 refills | Status: DC

## 2018-09-20 MED ORDER — GLIPIZIDE 5 MG PO TABS: ORAL_TABLET | ORAL | 0 refills | Status: DC
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
## 2018-09-27 ENCOUNTER — Other Ambulatory Visit: Payer: Self-pay

## 2018-09-27 ENCOUNTER — Ambulatory Visit (INDEPENDENT_AMBULATORY_CARE_PROVIDER_SITE_OTHER): Payer: 59 | Admitting: Family Medicine

## 2018-09-27 ENCOUNTER — Encounter: Payer: Self-pay | Admitting: Family Medicine

## 2018-09-27 VITALS — BP 145/90 | HR 86 | Temp 97.6°F | Ht 77.0 in | Wt 349.0 lb

## 2018-09-27 DIAGNOSIS — L03031 Cellulitis of right toe: Secondary | ICD-10-CM | POA: Diagnosis not present

## 2018-09-27 DIAGNOSIS — E119 Type 2 diabetes mellitus without complications: Secondary | ICD-10-CM | POA: Diagnosis not present

## 2018-09-27 MED ORDER — CEPHALEXIN 500 MG PO CAPS: 500.0000 mg | ORAL_CAPSULE | Freq: Four times a day (QID) | ORAL | 0 refills | Status: AC

## 2018-09-27 MED ORDER — CEFTRIAXONE SODIUM 1 G IJ SOLR
1.0000 g | Freq: Once | INTRAMUSCULAR | Status: AC
  Administered 2018-09-27: 15:00:00 1 g via INTRAMUSCULAR
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
## 2018-09-27 NOTE — Patient Instructions (Signed)
You are given a dose of Rocephin via injection today.  This is an antibiotic.  I would like you to start the cephalexin which was sent to [place] this evening with supper.  You can start taking this 4 times a day beginning tomorrow with breakfast.  Take roughly every 6 hours if possible.  We discussed that if you have no significant improvement in symptoms after being on antibiotics for 2 days that you are to contact me and we will add an additional antibiotic to your regimen.  If you develop any other worrisome symptoms or signs that we described including joint pain in the great toe, worsening redness, fevers you are to seek immediate medical attention as this can be signs of systemic/significant infection.   Cellulitis, Adult  Cellulitis is a skin infection. The infected area is often warm, red, swollen, and sore. It occurs most often in the arms and lower legs. It is very important to get treated for this condition. What are the causes? This condition is caused by bacteria. The bacteria enter through a break in the skin, such as a cut, burn, insect bite, open sore, or crack. What increases the risk? This condition is more likely to occur in people who:  Have a weak body defense system (immune system).  Have open cuts, burns, bites, or scrapes on the skin.  Are older than 58 years of age.  Have a blood sugar problem (diabetes).  Have a long-lasting (chronic) liver disease (cirrhosis) or kidney disease.  Are very overweight (obese).  Have a skin problem, such as: ? Itchy rash (eczema). ? Slow movement of blood in the veins (venous stasis). ? Fluid buildup below the skin (edema).  Have been treated with high-energy rays (radiation).  Use IV drugs. What are the signs or symptoms? Symptoms of this condition include:  Skin that is: ? Red. ? Streaking. ? Spotting. ? Swollen. ? Sore or painful when you touch it. ? Warm.  A fever.  Chills.  Blisters. How is this  diagnosed? This condition is diagnosed based on:  Medical history.  Physical exam.  Blood tests.  Imaging tests. How is this treated? Treatment for this condition may include:  Medicines to treat infections or allergies.  Home care, such as: ? Rest. ? Placing cold or warm cloths (compresses) on the skin.  Hospital care, if the condition is very bad. Follow these instructions at home: Medicines  Take over-the-counter and prescription medicines only as told by your doctor.  If you were prescribed an antibiotic medicine, take it as told by your doctor. Do not stop taking it even if you start to feel better. General instructions   Drink enough fluid to keep your pee (urine) pale yellow.  Do not touch or rub the infected area.  Raise (elevate) the infected area above the level of your heart while you are sitting or lying down.  Place cold or warm cloths on the area as told by your doctor.  Keep all follow-up visits as told by your doctor. This is important. Contact a doctor if:  You have a fever.  You do not start to get better after 1-2 days of treatment.  Your bone or joint under the infected area starts to hurt after the skin has healed.  Your infection comes back. This can happen in the same area or another area.  You have a swollen bump in the area.  You have new symptoms.  You feel ill and have muscle aches  and pains. Get help right away if:  Your symptoms get worse.  You feel very sleepy.  You throw up (vomit) or have watery poop (diarrhea) for a long time.  You see red streaks coming from the area.  Your red area gets larger.  Your red area turns dark in color. These symptoms may represent a serious problem that is an emergency. Do not wait to see if the symptoms will go away. Get medical help right away. Call your local emergency services (911 in the [place] Do not drive yourself to the hospital. Summary  Cellulitis is a skin infection. The area  is often warm, red, swollen, and sore.  This condition is treated with medicines, rest, and cold and warm cloths.  Take all medicines only as told by your doctor.  Tell your doctor if symptoms do not start to get better after 1-2 days of treatment. This information is not intended to replace advice given to you by your health care provider. Make sure you discuss any questions you have with your health care provider. Document Released: 10/22/2007 Document Revised: 09/24/2017 Document Reviewed: 09/24/2017 Elsevier Interactive Patient Education  2019 [employer].

## 2018-10-05 ENCOUNTER — Other Ambulatory Visit: Payer: Self-pay | Admitting: Family Medicine

## 2018-10-06 ENCOUNTER — Other Ambulatory Visit: Payer: Self-pay | Admitting: *Deleted

## 2018-10-06 MED ORDER — ALLOPURINOL 300 MG PO TABS: 300.0000 mg | ORAL_TABLET | Freq: Every day | ORAL | 0 refills | Status: DC

## 2018-11-21 ENCOUNTER — Other Ambulatory Visit: Payer: Self-pay | Admitting: Family Medicine

## 2018-11-21 DIAGNOSIS — E119 Type 2 diabetes mellitus without complications: Secondary | ICD-10-CM

## 2018-12-02 ENCOUNTER — Ambulatory Visit (INDEPENDENT_AMBULATORY_CARE_PROVIDER_SITE_OTHER): Payer: 59 | Admitting: *Deleted

## 2018-12-02 VITALS — BP 166/84 | HR 86 | Ht 77.0 in | Wt 337.0 lb

## 2018-12-02 DIAGNOSIS — E1142 Type 2 diabetes mellitus with diabetic polyneuropathy: Secondary | ICD-10-CM | POA: Diagnosis not present

## 2018-12-02 DIAGNOSIS — Z0000 Encounter for general adult medical examination without abnormal findings: Secondary | ICD-10-CM

## 2018-12-02 DIAGNOSIS — Z0001 Encounter for general adult medical examination with abnormal findings: Secondary | ICD-10-CM | POA: Diagnosis not present

## 2018-12-02 MED ORDER — CYCLOBENZAPRINE HCL 10 MG PO TABS: 10.0000 mg | ORAL_TABLET | Freq: Every day | ORAL | 1 refills | Status: DC

## 2018-12-02 MED ORDER — GLIPIZIDE 5 MG PO TABS: 5.0000 mg | ORAL_TABLET | Freq: Two times a day (BID) | ORAL | 1 refills | Status: DC
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
## 2018-12-02 NOTE — Patient Instructions (Signed)
  Anthony Logan , Thank you for taking time to come for your Medicare Wellness Visit. I appreciate your ongoing commitment to your health goals. Please review the following plan we discussed and let me know if I can assist you in the future.   These are the goals we discussed: Goals    . Prevent falls     Loose weight for better control of HTN and DM  Stay active        This is a list of the screening recommended for you and due dates:  Health Maintenance  Topic Date Due  . Colon Cancer Screening  03/20/2011  . Complete foot exam   08/06/2018  . Hemoglobin A1C  11/16/2018  . Pneumococcal vaccine  05/20/2019*  .  Hepatitis C: One time screening is recommended by Center for Disease Control  (CDC) for  adults born from [birth year] through 1965.   12/02/2019*  . HIV Screening  12/02/2019*  . Flu Shot  12/18/2018  . Eye exam for diabetics  06/11/2019  . Tetanus Vaccine  06/19/2021  *Topic was postponed. The date shown is not the original due date.

## 2018-12-08 ENCOUNTER — Telehealth: Payer: Self-pay | Admitting: Family Medicine

## 2018-12-08 ENCOUNTER — Other Ambulatory Visit (HOSPITAL_COMMUNITY): Payer: Self-pay | Admitting: Urology

## 2018-12-08 ENCOUNTER — Other Ambulatory Visit: Payer: Self-pay | Admitting: Urology

## 2018-12-08 DIAGNOSIS — C641 Malignant neoplasm of right kidney, except renal pelvis: Secondary | ICD-10-CM

## 2018-12-08 NOTE — Chronic Care Management (AMB) (Signed)
Chronic Care Management   Note  12/08/2018 Name: [name] MRN: [id number] DOB: 08/31/1960  Anthony Logan is a 58 y.o. year old male who is a primary care patient of [name], DO. I reached out to Anthony Logan by phone today in response to a referral sent by Anthony Logan's health plan.    Anthony Logan was given information about Chronic Care Management services today including:  1. CCM service includes personalized support from designated clinical staff supervised by his physician, including individualized plan of care and coordination with other care providers 2. 24/7 contact phone numbers for assistance for urgent and routine care needs. 3. Service will only be billed when office clinical staff spend 20 minutes or more in a month to coordinate care. 4. Only one practitioner may furnish and bill the service in a calendar month. 5. The patient may stop CCM services at any time (effective at the end of the month) by phone call to the office staff. 6. The patient will be responsible for cost sharing (co-pay) of up to 20% of the service fee (after annual deductible is met).  Patient agreed to services and verbal consent obtained.   Follow up plan: Telephone appointment with CCM team member scheduled for: 12/27/2018  Mitchell  ??[email or web address]   ??[phone]

## 2018-12-20 ENCOUNTER — Other Ambulatory Visit: Payer: Self-pay

## 2018-12-21 ENCOUNTER — Encounter: Payer: Self-pay | Admitting: Family Medicine

## 2018-12-21 ENCOUNTER — Ambulatory Visit (INDEPENDENT_AMBULATORY_CARE_PROVIDER_SITE_OTHER): Payer: 59 | Admitting: Family Medicine

## 2018-12-21 VITALS — BP 134/79 | HR 64 | Temp 95.5°F | Ht 77.0 in | Wt 344.8 lb

## 2018-12-21 DIAGNOSIS — E119 Type 2 diabetes mellitus without complications: Secondary | ICD-10-CM | POA: Diagnosis not present

## 2018-12-21 DIAGNOSIS — H9313 Tinnitus, bilateral: Secondary | ICD-10-CM | POA: Diagnosis not present

## 2018-12-21 DIAGNOSIS — E1142 Type 2 diabetes mellitus with diabetic polyneuropathy: Secondary | ICD-10-CM | POA: Diagnosis not present

## 2018-12-21 DIAGNOSIS — E785 Hyperlipidemia, unspecified: Secondary | ICD-10-CM

## 2018-12-21 DIAGNOSIS — E1169 Type 2 diabetes mellitus with other specified complication: Secondary | ICD-10-CM

## 2018-12-21 DIAGNOSIS — E1159 Type 2 diabetes mellitus with other circulatory complications: Secondary | ICD-10-CM | POA: Diagnosis not present

## 2018-12-21 DIAGNOSIS — I152 Hypertension secondary to endocrine disorders: Secondary | ICD-10-CM

## 2018-12-21 DIAGNOSIS — I10 Essential (primary) hypertension: Secondary | ICD-10-CM | POA: Diagnosis not present

## 2018-12-21 LAB — BAYER DCA HB A1C WAIVED: HB A1C (BAYER DCA - WAIVED): 7.1 % — ABNORMAL HIGH (ref <7.0)

## 2018-12-21 MED ORDER — SITAGLIPTIN PHOSPHATE 100 MG PO TABS: 100.0000 mg | ORAL_TABLET | Freq: Every day | ORAL | 1 refills | Status: DC

## 2018-12-21 MED ORDER — GABAPENTIN 600 MG PO TABS: ORAL_TABLET | ORAL | 1 refills | Status: DC

## 2018-12-21 NOTE — Patient Instructions (Addendum)
You had labs performed today.  You will be contacted with the results of the labs once they are available, usually in the next 3 business days for routine lab work.  If you have an active my chart account, they will be released to your MyChart.  If you prefer to have these labs released to you via telephone, please let us know.  If you had a pap smear or biopsy performed, expect to be contacted in about 7-10 days.   Tinnitus Tinnitus refers to hearing a sound when there is no actual source for that sound. This is often described as ringing in the ears. However, people with this condition may hear a variety of noises, in one ear or in both ears. The sounds of tinnitus can be soft, loud, or somewhere in between. Tinnitus can last for a few seconds or can be constant for days. It may go away without treatment and come back at various times. When tinnitus is constant or happens often, it can lead to other problems, such as trouble sleeping and trouble concentrating. Almost everyone experiences tinnitus at some point. Tinnitus that is long-lasting (chronic) or comes back often (recurs) may require medical attention. What are the causes? The cause of tinnitus is often not known. In some cases, it can result from other problems or conditions, including:  Exposure to loud noises from machinery, music, or other sources.  Hearing loss.  Ear or sinus infections.  Earwax buildup.  An object (foreign body) stuck in the ear.  Taking certain medicines.  Drinking alcohol or caffeine.  High blood pressure.  Heart diseases.  Anemia.  Allergies.  Meniere's disease.  Thyroid problems.  Tumors.  A weak, bulging blood vessel (aneurysm) near the ear.  Depression or other mood disorders. What are the signs or symptoms? The main symptom of tinnitus is hearing a sound when there is no source for that sound. It may sound like:  Buzzing.  Roaring.  Ringing.  Blowing air, like the sound heard  when you listen to a seashell.  Hissing.  Whistling.  Sizzling.  Humming.  Running water.  A musical note.  Tapping. Symptoms may affect only one ear (unilateral) or both ears (bilateral). How is this diagnosed? Tinnitus is diagnosed based on your symptoms, your medical history, and a physical exam. Your health care provider may do a thorough hearing test (audiologic exam) if your tinnitus:  Is unilateral.  Causes hearing difficulties.  Lasts 6 months or longer. You may work with a health care provider who specializes in hearing disorders (audiologist). You may be asked questions about your symptoms and how they affect your daily life. You may have other tests done, such as:  CT scan.  MRI.  An imaging test of how blood flows through your blood vessels (angiogram). How is this treated? Treating an underlying medical condition can sometimes make tinnitus go away. If your tinnitus continues, other treatments may include:  Medicines, such as antidepressants or sleeping aids.  Sound generators to mask the tinnitus. These include: ? Tabletop sound machines that play relaxing sounds to help you fall asleep. ? Wearable devices that fit in your ear and play sounds or music. ? Acoustic neural stimulation. This involves using headphones to listen to music that contains an auditory signal. Over time, listening to this signal may change some pathways in your brain and make you less sensitive to tinnitus. This treatment is used for very severe cases when no other treatment is working.  Therapy and  counseling to help you manage the stress of living with tinnitus.  Using hearing aids or cochlear implants if your tinnitus is related to hearing loss. Hearing aids are worn in the outer ear. Cochlear implants are surgically placed in the inner ear. Follow these instructions at home: Managing symptoms      When possible, avoid being in loud places and being exposed to loud sounds.   Wear hearing protection, such as earplugs, when you are exposed to loud noises.  Use a white noise machine, a humidifier, or other devices to mask the sound of tinnitus.  Practice techniques for reducing stress, such as meditation, yoga, or deep breathing. Work with your health care provider if you need help with managing stress.  Sleep with your head slightly raised. This may reduce the impact of tinnitus. General instructions  Do not use stimulants, such as nicotine, alcohol, or caffeine. Talk with your health care provider about other stimulants to avoid. Stimulants are substances that can make you feel alert and attentive by increasing certain activities in the body (such as heart rate and blood pressure). These substances may make tinnitus worse.  Take over-the-counter and prescription medicines only as told by your health care provider.  Try to get plenty of sleep each night.  Keep all follow-up visits as told by your health care provider. This is important. Contact a health care provider if:  Your tinnitus continues for 3 weeks or longer without stopping.  Your symptoms get worse or do not get better with home care.  You develop tinnitus after a head injury.  You have tinnitus along with any of the following: ? Dizziness. ? Loss of balance. ? Nausea and vomiting. Summary  Tinnitus refers to hearing a sound when there is no actual source for that sound. This is often described as ringing in the ears.  Symptoms may affect only one ear (unilateral) or both ears (bilateral).  Use a white noise machine, a humidifier, or other devices to mask the sound of tinnitus.  Do not use stimulants, such as nicotine, alcohol, or caffeine. Talk with your health care provider about other stimulants to avoid. These substances may make tinnitus worse. This information is not intended to replace advice given to you by your health care provider. Make sure you discuss any questions you have with  your health care provider. Document Released: 05/05/2005 Document Revised: 04/17/2017 Document Reviewed: 02/12/2017 Elsevier Patient Education  2020 [employer].
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
## 2018-12-22 ENCOUNTER — Other Ambulatory Visit: Payer: Self-pay | Admitting: Family Medicine

## 2018-12-22 LAB — LIPID PANEL
Chol/HDL Ratio: 5 ratio (ref 0.0–5.0)
Cholesterol, Total: 134 mg/dL (ref 100–199)
HDL: 27 mg/dL — ABNORMAL LOW (ref >39)
LDL Calculated: 35 mg/dL (ref 0–99)
Triglycerides: 360 mg/dL — ABNORMAL HIGH (ref 0–149)
VLDL Cholesterol Cal: 72 mg/dL — ABNORMAL HIGH (ref 5–40)

## 2018-12-22 LAB — CMP14+EGFR
ALT: 28 IU/L (ref 0–44)
AST: 24 IU/L (ref 0–40)
Albumin/Globulin Ratio: 1.6 (ref 1.2–2.2)
Albumin: 4.1 g/dL (ref 3.8–4.9)
Alkaline Phosphatase: 69 IU/L (ref 39–117)
BUN/Creatinine Ratio: 15 (ref 9–20)
BUN: 18 mg/dL (ref 6–24)
Bilirubin Total: 0.6 mg/dL (ref 0.0–1.2)
CO2: 27 mmol/L (ref 20–29)
Calcium: 9.3 mg/dL (ref 8.7–10.2)
Chloride: 95 mmol/L — ABNORMAL LOW (ref 96–106)
Creatinine, Ser: 1.17 mg/dL (ref 0.76–1.27)
GFR calc Af Amer: 80 mL/min/{1.73_m2} (ref >59)
GFR calc non Af Amer: 69 mL/min/{1.73_m2} (ref >59)
Globulin, Total: 2.5 g/dL (ref 1.5–4.5)
Glucose: 233 mg/dL — ABNORMAL HIGH (ref 65–99)
Potassium: 3.9 mmol/L (ref 3.5–5.2)
Sodium: 140 mmol/L (ref 134–144)
Total Protein: 6.6 g/dL (ref 6.0–8.5)

## 2018-12-22 MED ORDER — ATORVASTATIN CALCIUM 20 MG PO TABS: 20.0000 mg | ORAL_TABLET | Freq: Every day | ORAL | 3 refills | Status: DC

## 2018-12-24 ENCOUNTER — Other Ambulatory Visit: Payer: Self-pay

## 2018-12-24 ENCOUNTER — Encounter (HOSPITAL_COMMUNITY): Payer: Self-pay

## 2018-12-24 ENCOUNTER — Other Ambulatory Visit: Payer: Self-pay | Admitting: Family Medicine

## 2018-12-24 ENCOUNTER — Ambulatory Visit (HOSPITAL_COMMUNITY)
Admission: RE | Admit: 2018-12-24 | Discharge: 2018-12-24 | Disposition: A | Discharge status: Home / Self Care | Payer: 59 | Source: Ambulatory Visit | Attending: Urology | Admitting: Urology

## 2018-12-24 DIAGNOSIS — C641 Malignant neoplasm of right kidney, except renal pelvis: Secondary | ICD-10-CM | POA: Insufficient documentation

## 2018-12-24 DIAGNOSIS — N281 Cyst of kidney, acquired: Secondary | ICD-10-CM | POA: Diagnosis not present

## 2018-12-24 LAB — SPECIMEN STATUS REPORT

## 2018-12-24 LAB — TSH: TSH: 1.37 u[IU]/mL (ref 0.450–4.500)

## 2018-12-24 MED ORDER — IOHEXOL 300 MG/ML  SOLN
100.0000 mL | Freq: Once | INTRAMUSCULAR | Status: AC | PRN
  Administered 2018-12-24: 08:00:00 100 mL via INTRAVENOUS

## 2018-12-27 ENCOUNTER — Ambulatory Visit (INDEPENDENT_AMBULATORY_CARE_PROVIDER_SITE_OTHER): Payer: 59 | Admitting: *Deleted

## 2018-12-27 DIAGNOSIS — F329 Major depressive disorder, single episode, unspecified: Secondary | ICD-10-CM

## 2018-12-27 DIAGNOSIS — I10 Essential (primary) hypertension: Secondary | ICD-10-CM

## 2018-12-27 DIAGNOSIS — E1159 Type 2 diabetes mellitus with other circulatory complications: Secondary | ICD-10-CM

## 2018-12-27 DIAGNOSIS — F32A Depression, unspecified: Secondary | ICD-10-CM

## 2018-12-27 DIAGNOSIS — E1169 Type 2 diabetes mellitus with other specified complication: Secondary | ICD-10-CM

## 2018-12-27 DIAGNOSIS — E785 Hyperlipidemia, unspecified: Secondary | ICD-10-CM | POA: Diagnosis not present

## 2018-12-27 DIAGNOSIS — E1142 Type 2 diabetes mellitus with diabetic polyneuropathy: Secondary | ICD-10-CM

## 2018-12-27 DIAGNOSIS — I152 Hypertension secondary to endocrine disorders: Secondary | ICD-10-CM

## 2018-12-27 NOTE — Chronic Care Management (AMB) (Signed)
Chronic Care Management   Initial Visit Note  12/27/2018 Name: Anthony Logan MRN: [id number] DOB: [birth year]  Referred by: Anthony Norlander, DO Reason for referral : Chronic Care Management (RNCM Initial Visit)   Anthony Logan is a 58 y.o. year old male who is a primary care patient of Anthony Norlander, DO. The CCM team was consulted for assistance with chronic disease management and care coordination needs.   Subjective: "I'm having trouble sleeping at night. It feels like someone is grabbing my feet."   I talked with Anthony Logan by telephone today. He was unavailable when I initially called but he returned my call a little later. Acute on chronic concerns regarding diabetes, foot care, and peripheral neuropathy were discussed today. Additional assessment questions will be answered and addressed at future visit.     Objective:   Lab Results  Component Value Date   HGBA1C 7.1 (H) 12/21/2018   HGBA1C 8.5 (H) 05/17/2018   HGBA1C 6.3 02/12/2018   Lab Results  Component Value Date   LDLCALC 35 12/21/2018   CREATININE 1.17 12/21/2018   BP Readings from Last 3 Encounters:  12/21/18 134/79  12/02/18 (!) 166/84  09/27/18 (!) 145/90   Wt Readings from Last 3 Encounters:  12/21/18 (!) 344 lb 12.8 oz (156.4 kg)  12/02/18 (!) 337 lb (152.9 kg)  09/27/18 (!) 349 lb (158.3 kg)   Medication List Accu-Chek Aviva Plus w/Device Kit Use to check BG daily   allopurinol 300 MG tablet Commonly known as: ZYLOPRIM TAKE 1 TABLET DAILY   aspirin 81 MG chewable tablet Chew 81 mg by mouth daily.   atorvastatin 20 MG tablet Commonly known as: LIPITOR Take 1 tablet (20 mg total) by mouth daily.   buPROPion 300 MG 24 hr tablet Commonly known as: Wellbutrin XL Take 1 tablet (300 mg total) by mouth daily.   cholecalciferol 1000 units tablet Commonly known as: VITAMIN D Take 1,000 Units by mouth daily.   cyclobenzaprine 10 MG tablet Commonly known as: FLEXERIL Take 1 tablet (10 mg  total) by mouth at bedtime.   furosemide 20 MG tablet Commonly known as: LASIX Take 2 tablets (40 mg total) by mouth daily.   gabapentin 600 MG tablet Commonly known as: NEURONTIN Take 1 tablet (600 mg total) by mouth daily with breakfast AND 1.5 tablets (900 mg total) at bedtime. Taling 318m in the morning and 901mat bedtime. Morning dose does not make him drowsy anymore. Nighttime dose is not helping with sleep.   glipiZIDE 5 MG tablet Commonly known as: GLUCOTROL Take 1 tablet (5 mg total) by mouth 2 (two) times daily before a meal.   glucose blood test strip Commonly known as: Accu-Chek Aviva Use to check BG daily   hydrALAZINE 25 MG tablet Commonly known as: APRESOLINE Take 1 tablet (25 mg total) by mouth 3 (three) times daily.   hydrochlorothiazide 25 MG tablet Commonly known as: HYDRODIURIL Take 1 tablet (25 mg total) by mouth daily.   Lancets Misc Use to check BG daily   lisinopril 40 MG tablet Commonly known as: ZESTRIL Take 1 tablet (40 mg total) by mouth daily.   metoprolol tartrate 100 MG tablet Commonly known as: LOPRESSOR Take 2 tablets (200 mg total) by mouth 2 (two) times daily.   sitaGLIPtin 100 MG tablet Commonly known as: [name] Take 1 tablet (100 mg total) by mouth daily.       Assessment: Goals Addressed  This Visit's Progress     Patient Stated   . "I want the ulcers on my toes to heal" (pt-stated)       Current Barriers:  . Chronic Disease Management support and education needs related to diabetic foot care  Nurse Case Manager Clinical Goal(s):  Marland Kitchen Over the next 30 days, patient will verbalize understanding of plan for diabetic foot care . Over the next 30 days, patient will meet with RN Care Manager to address ulcerations on both toes  Interventions:  . Evaluation of current treatment plan related to diabetic foot care and patient's adherence to plan as established by provider. o Ulceration on left toe for > 20 years.  Has seen wound care in the past. Seeing continual improvement.  o Ulceration on Right great toe for 2 months. Was a callous that he pulled skin off of. Treating it the same as left toe and seeing continual improvement.  o Has seen a podiatrist in the past. Does not see one now. o Checks feet daily. Uses dressing techniques that he learned from the wound center . Advised patient to continue checking feet daily and to report any new or worsening symptoms or if continued improvement is not noted. . Provided education to patient re: diabetic foot care. o S/s of infection reviewed. o Continue to wear shoes and socks when walking o Keep feet dry . Discussed plans with patient for ongoing care management follow up and provided patient with direct contact information for care management team  Patient Self Care Activities:  . Performs ADL's independently . Performs IADL's independently  Initial goal documentation     . "I want to keep my blood sugar under control" (pt-stated)       Current Barriers:  . Chronic Disease Management support and education needs related to diabetes  Nurse Case Manager Clinical Goal(s):  Marland Kitchen Over the next 30 days, patient will meet with RN Care Manager to address diabetes, recommended diet, and blood sugar control.   Interventions:  . Evaluation of current treatment plan related to diabetes and patient's adherence to plan as established by provider. . Provided education to patient re: meal planning, portion sizes, and carbohydrates . Reviewed medications with patient and discussed glipizide and [name] . Discussed plans with patient for ongoing care management follow up and provided patient with direct contact information for care management team . Discussed home blood sugar testing and average readings o Typically 150 to 200 fasting in the morning  Patient Self Care Activities:  . Performs ADL's independently . Performs IADL's independently  Initial goal  documentation     . "I'd like to get better control of my foot/leg discomfort so I can sleep better" (pt-stated)       Current Barriers:  Marland Kitchen Knowledge Deficits related to neuropathy management . Chronic Disease Management support and education needs related to sleep hygiene and peripheral neuropathy management  Nurse Case Manager Clinical Goal(s):  Marland Kitchen Over the next 30 days, patient will work with PCP to address needs related to neuropathic pain management (medication) . Over the next 30 days, patient will meet with RN Care Manager to address ongoing self management of peripheral neuropathy and sleep.  Interventions:  . Evaluation of current treatment plan related to peripheral neuropathy and patient's adherence to plan as established by provider. . Reviewed medications with patient and discussed gabapentin . Discussed plans with patient for ongoing care management follow up and provided patient with direct contact information for care management team .  Will collaborate with PCP, Dr Lajuana Ripple, regarding ineffectiveness of recent gabapentin dose increase on peripheral neuropathy  Patient Self Care Activities:  . Performs ADL's independently . Performs IADL's independently  Initial goal documentation        Plan:  RNCM will collaborate with PCP regarding leg/foot discomfort at night Telephone follow up appointment with care management team member scheduled for: The patient will call [phone] or [phone]* as advised to report any new or worsening foot ulcerations..  Follow up with provider re: difficulty sleeping due to leg/foot discomfort  [name] BSN, RN-BC [place] / [place]: [phone]

## 2018-12-27 NOTE — Chronic Care Management (AMB) (Signed)
Chronic Care Management   Initial Visit Note  12/27/2018 Name: Anthony Logan MRN: [id number] DOB: 1961/05/18  Referred by: [name], DO Reason for referral : Chronic Care Management (RNCM Initial Visit)   Anthony Logan is a 58 y.o. year old male who is a primary care patient of [name], DO. The CCM team was consulted for assistance with chronic disease management and care coordination needs.   Review of patient status, including review of consultants reports, relevant laboratory and other test results, and collaboration with appropriate care team members and the patient's provider was performed as part of comprehensive patient evaluation and provision of chronic care management services.    SDOH (Social Determinants of Health) screening performed today. See Care Plan Entry related to challenges with: Physical Activity  Objective:   Goals Addressed            This Visit's Progress     Patient Stated   . "I want the ulcers on my toes to heal" (pt-stated)       Current Barriers:  . Chronic Disease Management support and education needs related to diabetic foot care  Nurse Case Manager Clinical Goal(s):  Marland Kitchen Over the next 30 days, patient will verbalize understanding of plan for diabetic foot care . Over the next 30 days, patient will meet with RN Care Manager to address ulcerations on both toes  Interventions:  . Evaluation of current treatment plan related to diabetic foot care and patient's adherence to plan as established by provider. o Ulceration on left toe for > 20 years. Has seen wound care in the past. Seeing continual improvement.  o Ulceration on Right great toe for 2 months. Was a callous that he pulled skin off of. Treating it the same as left toe and seeing continual improvement.  o Has seen a podiatrist in the past. Does not see one now. o Checks feet daily. Uses dressing techniques that he learned from the wound center . Advised patient to  continue checking feet daily and to report any new or worsening symptoms or if continued improvement is not noted. . Provided education to patient re: diabetic foot care. o S/s of infection reviewed. o Continue to wear shoes and socks when walking o Keep feet dry . Discussed plans with patient for ongoing care management follow up and provided patient with direct contact information for care management team  Patient Self Care Activities:  . Performs ADL's independently . Performs IADL's independently  Initial goal documentation     . "I want to keep my blood sugar under control" (pt-stated)       Current Barriers:  . Chronic Disease Management support and education needs related to diabetes  Nurse Case Manager Clinical Goal(s):  Marland Kitchen Over the next 30 days, patient will meet with RN Care Manager to address diabetes, recommended diet, and blood sugar control.   Interventions:  . Evaluation of current treatment plan related to diabetes and patient's adherence to plan as established by provider. . Provided education to patient re: meal planning, portion sizes, and carbohydrates . Reviewed medications with patient and discussed glipizide and [name] . Discussed plans with patient for ongoing care management follow up and provided patient with direct contact information for care management team . Discussed home blood sugar testing and average readings o Typically 150 to 200 fasting in the morning  Patient Self Care Activities:  . Performs ADL's independently . Performs IADL's independently  Initial goal documentation     . "  I'd like to get better control of my foot/leg discomfort so I can sleep better" (pt-stated)       Current Barriers:  Marland Kitchen Knowledge Deficits related to neuropathy management . Chronic Disease Management support and education needs related to sleep hygiene and peripheral neuropathy management  Nurse Case Manager Clinical Goal(s):  Marland Kitchen Over the next 30 days, patient will  work with PCP to address needs related to neuropathic pain management (medication) . Over the next 30 days, patient will meet with RN Care Manager to address ongoing self management of peripheral neuropathy and sleep.  Interventions:  . Evaluation of current treatment plan related to peripheral neuropathy and patient's adherence to plan as established by provider. . Reviewed medications with patient and discussed gabapentin . Discussed plans with patient for ongoing care management follow up and provided patient with direct contact information for care management team . Will collaborate with PCP, Dr Lajuana Ripple, regarding ineffectiveness of recent gabapentin dose increase on peripheral neuropathy  Patient Self Care Activities:  . Performs ADL's independently . Performs IADL's independently  Initial goal documentation         Anthony Logan was given information about Chronic Care Management services today including:  1. CCM service includes personalized support from designated clinical staff supervised by his physician, including individualized plan of care and coordination with other care providers 2. 24/7 contact phone numbers for assistance for urgent and routine care needs. 3. Service will only be billed when office clinical staff spend 20 minutes or more in a month to coordinate care. 4. Only one practitioner may furnish and bill the service in a calendar month. 5. The patient may stop CCM services at any time (effective at the end of the month) by phone call to the office staff. 6. The patient will be responsible for cost sharing (co-pay) of up to 20% of the service fee (after annual deductible is met).  Patient agreed to services and verbal consent obtained.   Plan:   The care management team will reach out to the patient again over the next 30 days.   Provider Signature

## 2018-12-27 NOTE — Chronic Care Management (AMB) (Signed)
  Chronic Care Management   Outreach Note  12/27/2018 Name: Anthony Logan MRN: [id number] DOB: 16-Apr-1961  Referred by: [name], DO Reason for referral : Chronic Care Management (RNCM Initial Visit)   An unsuccessful telephone outreach was attempted today. The patient was referred by his health plan to the case management team by for assistance with chronic care management and care coordination. Chart review was performed and patient may have CCM needs related to diabetes, hypertension, hx of renal cell carcinoma, hyperlipidemia, and depression.  Follow Up Plan: A HIPPA compliant phone message was left for the patient providing contact information and requesting a return call.  The care management team will reach out to the patient again over the next 14 days.    [name] BSN, RN-BC Embedded Chronic Care Manager [facility] [place] [facility] Direct Dial: [phone]

## 2018-12-27 NOTE — Patient Instructions (Addendum)
Visit Information  Goals Addressed            This Visit's Progress     Patient Stated   . "I want the ulcers on my toes to heal" (pt-stated)       Current Barriers:  . Chronic Disease Management support and education needs related to diabetic foot care  Nurse Case Manager Clinical Goal(s):  Marland Kitchen Over the next 30 days, patient will verbalize understanding of plan for diabetic foot care . Over the next 30 days, patient will meet with RN Care Manager to address ulcerations on both toes  Interventions:  . Evaluation of current treatment plan related to diabetic foot care and patient's adherence to plan as established by provider. o Ulceration on left toe for > 20 years. Has seen wound care in the past. Seeing continual improvement.  o Ulceration on Right great toe for 2 months. Was a callous that he pulled skin off of. Treating it the same as left toe and seeing continual improvement.  o Has seen a podiatrist in the past. Does not see one now. o Checks feet daily. Uses dressing techniques that he learned from the wound center . Advised patient to continue checking feet daily and to report any new or worsening symptoms or if continued improvement is not noted. . Provided education to patient re: diabetic foot care. o S/s of infection reviewed. o Continue to wear shoes and socks when walking o Keep feet dry . Discussed plans with patient for ongoing care management follow up and provided patient with direct contact information for care management team  Patient Self Care Activities:  . Performs ADL's independently . Performs IADL's independently  Initial goal documentation     . "I want to keep my blood sugar under control" (pt-stated)       Current Barriers:  . Chronic Disease Management support and education needs related to diabetes  Nurse Case Manager Clinical Goal(s):  Marland Kitchen Over the next 30 days, patient will meet with RN Care Manager to address diabetes, recommended diet, and  blood sugar control.   Interventions:  . Evaluation of current treatment plan related to diabetes and patient's adherence to plan as established by provider. . Provided education to patient re: meal planning, portion sizes, and carbohydrates . Reviewed medications with patient and discussed glipizide and [name] . Discussed plans with patient for ongoing care management follow up and provided patient with direct contact information for care management team . Discussed home blood sugar testing and average readings o Typically 150 to 200 fasting in the morning  Patient Self Care Activities:  . Performs ADL's independently . Performs IADL's independently  Initial goal documentation     . "I'd like to get better control of my foot/leg discomfort so I can sleep better" (pt-stated)       Current Barriers:  Marland Kitchen Knowledge Deficits related to neuropathy management . Chronic Disease Management support and education needs related to sleep hygiene and peripheral neuropathy management  Nurse Case Manager Clinical Goal(s):  Marland Kitchen Over the next 30 days, patient will work with PCP to address needs related to neuropathic pain management (medication) . Over the next 30 days, patient will meet with RN Care Manager to address ongoing self management of peripheral neuropathy and sleep.  Interventions:  . Evaluation of current treatment plan related to peripheral neuropathy and patient's adherence to plan as established by provider. . Reviewed medications with patient and discussed gabapentin . Discussed plans with patient for ongoing care  management follow up and provided patient with direct contact information for care management team . Will collaborate with PCP, Dr Lajuana Ripple, regarding ineffectiveness of recent gabapentin dose increase on peripheral neuropathy  Patient Self Care Activities:  . Performs ADL's independently . Performs IADL's independently  Initial goal documentation        Print copy  of patient instructions provided.  Printed and mailed to patient.   Mr. Caseres was given information about Chronic Care Management services today including:  1. CCM service includes personalized support from designated clinical staff supervised by his physician, including individualized plan of care and coordination with other care providers 2. 24/7 contact phone numbers for assistance for urgent and routine care needs. 3. Service will only be billed when office clinical staff spend 20 minutes or more in a month to coordinate care. 4. Only one practitioner may furnish and bill the service in a calendar month. 5. The patient may stop CCM services at any time (effective at the end of the month) by phone call to the office staff. 6. The patient will be responsible for cost sharing (co-pay) of up to 20% of the service fee (after annual deductible is met).  Patient agreed to services and verbal consent obtained.   Plan RNCM will collaborate with PCP regarding leg/foot discomfort at night Telephone follow up appointment with care management team member scheduled for: The patient will call [phone] or [phone]* as advised to report any new or worsening foot ulcerations..  Follow up with provider re: difficulty sleeping due to leg/foot discomfort   [name] BSN, RN-BC [place] / [place]: [phone]

## 2019-01-14 ENCOUNTER — Ambulatory Visit (INDEPENDENT_AMBULATORY_CARE_PROVIDER_SITE_OTHER): Payer: 59 | Admitting: Urology

## 2019-01-14 DIAGNOSIS — R800 Isolated proteinuria: Secondary | ICD-10-CM | POA: Diagnosis not present

## 2019-01-14 DIAGNOSIS — Z85528 Personal history of other malignant neoplasm of kidney: Secondary | ICD-10-CM

## 2019-01-14 DIAGNOSIS — N182 Chronic kidney disease, stage 2 (mild): Secondary | ICD-10-CM

## 2019-01-18 ENCOUNTER — Ambulatory Visit (INDEPENDENT_AMBULATORY_CARE_PROVIDER_SITE_OTHER): Payer: 59 | Admitting: *Deleted

## 2019-01-18 DIAGNOSIS — E1142 Type 2 diabetes mellitus with diabetic polyneuropathy: Secondary | ICD-10-CM

## 2019-01-18 DIAGNOSIS — G629 Polyneuropathy, unspecified: Secondary | ICD-10-CM

## 2019-01-18 NOTE — Chronic Care Management (AMB) (Signed)
Chronic Care Management   Follow Up Note   01/18/2019 Name: Anthony Logan MRN: [id number] DOB: 07/13/1960  Referred by: [name], DO Reason for referral : Chronic Care Management (RNCM follow up)   Anthony Logan is a 58 y.o. year old male who is a primary care patient of [name], DO. The CCM team was consulted for assistance with chronic disease management and care coordination needs.    Review of patient status, including review of consultants reports, relevant laboratory and other test results, and collaboration with appropriate care team members and the patient's provider was performed as part of comprehensive patient evaluation and provision of chronic care management services.  I spoke with Anthony Logan by telephone today regarding self management of diabetes and peripheral neuropathy.     SDOH (Social Determinants of Health) screening performed today: Physical Activity. See Care Plan for related entries.   Outpatient Encounter Medications as of 01/18/2019  Medication Sig Note  . allopurinol (ZYLOPRIM) 300 MG tablet TAKE 1 TABLET DAILY   . aspirin 81 MG chewable tablet Chew 81 mg by mouth daily.   Marland Kitchen atorvastatin (LIPITOR) 20 MG tablet Take 1 tablet (20 mg total) by mouth daily.   . Blood Glucose Monitoring Suppl (ACCU-CHEK AVIVA PLUS) w/Device KIT Use to check BG daily   . buPROPion (WELLBUTRIN XL) 300 MG 24 hr tablet Take 1 tablet (300 mg total) by mouth daily.   . cholecalciferol (VITAMIN D) 1000 units tablet Take 1,000 Units by mouth daily.   . cyclobenzaprine (FLEXERIL) 10 MG tablet Take 1 tablet (10 mg total) by mouth at bedtime.   . furosemide (LASIX) 20 MG tablet Take 2 tablets (40 mg total) by mouth daily.   Marland Kitchen gabapentin (NEURONTIN) 600 MG tablet Take 1 tablet (600 mg total) by mouth daily with breakfast AND 1.5 tablets (900 mg total) at bedtime. 12/27/2018: Patient reported: taking 380m (1/2 tab) in the morning and 9067m(1.5 tabs) in the evening  .  glipiZIDE (GLUCOTROL) 5 MG tablet Take 1 tablet (5 mg total) by mouth 2 (two) times daily before a meal.   . glucose blood (ACCU-CHEK AVIVA) test strip Use to check BG daily   . hydrALAZINE (APRESOLINE) 25 MG tablet Take 1 tablet (25 mg total) by mouth 3 (three) times daily.   . hydrochlorothiazide (HYDRODIURIL) 25 MG tablet Take 1 tablet (25 mg total) by mouth daily.   . Lancets MISC Use to check BG daily   . lisinopril (PRINIVIL,ZESTRIL) 40 MG tablet Take 1 tablet (40 mg total) by mouth daily.   . metoprolol tartrate (LOPRESSOR) 100 MG tablet Take 2 tablets (200 mg total) by mouth 2 (two) times daily.   . sitaGLIPtin ([name]) 100 MG tablet Take 1 tablet (100 mg total) by mouth daily.    No facility-administered encounter medications on file as of 01/18/2019.      Goals Addressed            This Visit's Progress     Patient Stated   . "I want the ulcers on my toes to heal" (pt-stated)       Current Barriers:  . Chronic Disease Management support and education needs related to diabetic foot care  Nurse Case Manager Clinical Goal(s):  . Marland Kitchenver the next 30 days, the patient will demonstrate ongoing self health care management ability as evidenced by continued improvement in foot ulcerations*  Interventions:  . Discussed current condition of feet o Checks feet daily. Uses dressing techniques  that he learned from the wound center o Reports that ulcerations are improving and that the newest one is almost healed . Advised patient to continue checking feet daily and to report any new or worsening symptoms or if continued improvement is not noted. . Provided education to patient re: diabetic foot care. o S/s of infection reviewed. o Continue to wear shoes and socks when walking o Keep feet dry . Discussed plans with patient for ongoing care management follow up and provided patient with direct contact information for care management team  Patient Self Care Activities:  . Performs ADL's  independently . Performs IADL's independently  Please see past updates related to this goal by clicking on the "Past Updates" button in the selected goal      . "I want to keep my blood sugar under control" (pt-stated)       Current Barriers:  . Chronic Disease Management support and education needs related to diabetes  Nurse Case Manager Clinical Goal(s):  Marland Kitchen Over the next 30 days, the patient will demonstrate ongoing self health care management ability as evidenced by proper meal planning and portion sizes* . Over the next 30 days, patient will demonstrate improvement in self health care management as evidenced by an average fasting CBG of less than 150.   Interventions:  . Evaluation of current treatment plan related to diabetes and patient's adherence to plan as established by provider. . Encouraged to continue with meal planning and eating appropriate portion sizes . Patient taking medications as prescribed . Discussed plans with patient for ongoing care management follow up and provided patient with direct contact information for care management team . Discussed home blood sugar testing and average readings o Typically 150 to 200 fasting in the morning  Patient Self Care Activities:  . Performs ADL's independently . Performs IADL's independently  Please see past updates related to this goal by clicking on the "Past Updates" button in the selected goal      . "I'd like to get better control of my foot/leg discomfort so I can sleep better" (pt-stated)       Current Barriers:  Marland Kitchen Knowledge Deficits related to neuropathy management . Chronic Disease Management support and education needs related to sleep hygiene and peripheral neuropathy management  Nurse Case Manager Clinical Goal(s):  Marland Kitchen Over the next 30 days, patient will work with [facility] to address needs related to peripheral neuropathy and sleep disturbance  Interventions:  . Reviewed medications with patient and discussed  gabapentin. Patient is taking 327m (1/2 tab) in the morning and 9039m(1.5 tabs) at bedtime.  . Discussed plans with patient for ongoing care management follow up and provided patient with direct contact information for care management team . Discussed prior nerve conduction study. [facility]  . Marland Kitchenollaborated with Dr GoLajuana RippleShe recommended increasing morning dose to 60039ms prescribed and maintaining nighttime dose of 900m68m. RNMarland KitchenM will f/u over the next 30 days to assess improvement . Advised patient to report any new or worsening symptoms/side effects by calling the office at [phone] can reach out to me at [phone] Self Care Activities:  . Performs ADL's independently . Performs IADL's independently  Please see past updates related to this goal by clicking on the "Past Updates" button in the selected goal         Plan Patient will increase morning dose of Gabapentin to 600mg18m  per Dr Lajuana Ripple The care management team will reach out to the patient again over the next 30 days.   RNCM will request paper chart from [place] to see if nerve conduction report is available   [name] BSN, RN-BC [place] / [place] [facility] Direct Dial: [phone]

## 2019-01-18 NOTE — Patient Instructions (Signed)
Visit Information  Goals Addressed            This Visit's Progress     Patient Stated   . "I want the ulcers on my toes to heal" (pt-stated)       Current Barriers:  . Chronic Disease Management support and education needs related to diabetic foot care  Nurse Case Manager Clinical Goal(s):  Marland Kitchen Over the next 30 days, the patient will demonstrate ongoing self health care management ability as evidenced by continued improvement in foot ulcerations*  Interventions:  . Discussed current condition of feet o Checks feet daily. Uses dressing techniques that he learned from the wound center o Reports that ulcerations are improving and that the newest one is almost healed . Advised patient to continue checking feet daily and to report any new or worsening symptoms or if continued improvement is not noted. . Provided education to patient re: diabetic foot care. o S/s of infection reviewed. o Continue to wear shoes and socks when walking o Keep feet dry . Discussed plans with patient for ongoing care management follow up and provided patient with direct contact information for care management team  Patient Self Care Activities:  . Performs ADL's independently . Performs IADL's independently  Please see past updates related to this goal by clicking on the "Past Updates" button in the selected goal      . "I want to keep my blood sugar under control" (pt-stated)       Current Barriers:  . Chronic Disease Management support and education needs related to diabetes  Nurse Case Manager Clinical Goal(s):  Marland Kitchen Over the next 30 days, the patient will demonstrate ongoing self health care management ability as evidenced by proper meal planning and portion sizes* . Over the next 30 days, patient will demonstrate improvement in self health care management as evidenced by an average fasting CBG of less than 150.   Interventions:  . Evaluation of current treatment plan related to diabetes and  patient's adherence to plan as established by provider. . Encouraged to continue with meal planning and eating appropriate portion sizes . Patient taking medications as prescribed . Discussed plans with patient for ongoing care management follow up and provided patient with direct contact information for care management team . Discussed home blood sugar testing and average readings o Typically 150 to 200 fasting in the morning  Patient Self Care Activities:  . Performs ADL's independently . Performs IADL's independently  Please see past updates related to this goal by clicking on the "Past Updates" button in the selected goal      . "I'd like to get better control of my foot/leg discomfort so I can sleep better" (pt-stated)       Current Barriers:  Marland Kitchen Knowledge Deficits related to neuropathy management . Chronic Disease Management support and education needs related to sleep hygiene and peripheral neuropathy management  Nurse Case Manager Clinical Goal(s):  Marland Kitchen Over the next 30 days, patient will work with [facility] to address needs related to peripheral neuropathy and sleep disturbance  Interventions:  . Reviewed medications with patient and discussed gabapentin. Patient is taking 300mg  (1/2 tab) in the morning and 900mg  (1.5 tabs) at bedtime.  . Discussed plans with patient for ongoing care management follow up and provided patient with direct contact information for care management team . Discussed prior nerve conduction study. Patient reports having one years ago at [facility]. May be in paper chart.  Marland Kitchen Collaborated with Dr Lajuana Ripple.  She recommended increasing morning dose to 600mg  as prescribed and maintaining nighttime dose of 900mg .  . RNCM will f/u over the next 30 days to assess improvement . Advised patient to report any new or worsening symptoms/side effects by calling the office at [phone] or he can reach out to me at [phone]  Patient Self Care Activities:   . Performs ADL's independently . Performs IADL's independently  Please see past updates related to this goal by clicking on the "Past Updates" button in the selected goal         The patient verbalized understanding of instructions provided today and declined a print copy of patient instruction materials.   Plan Patient will increase morning dose of Gabapentin to 600mg  per Dr Lajuana Ripple The care management team will reach out to the patient again over the next 30 days.   RNCM will request paper chart from [place] to see if nerve conduction report is available   [name] BSN, RN-BC [place] / [place] [facility] Direct Dial: [phone]

## 2019-01-19 ENCOUNTER — Telehealth: Payer: 59

## 2019-01-31 ENCOUNTER — Ambulatory Visit (INDEPENDENT_AMBULATORY_CARE_PROVIDER_SITE_OTHER): Payer: 59 | Admitting: Otolaryngology

## 2019-01-31 DIAGNOSIS — H903 Sensorineural hearing loss, bilateral: Secondary | ICD-10-CM

## 2019-01-31 DIAGNOSIS — H9313 Tinnitus, bilateral: Secondary | ICD-10-CM | POA: Diagnosis not present

## 2019-02-17 ENCOUNTER — Ambulatory Visit: Payer: 59 | Admitting: *Deleted

## 2019-03-22 ENCOUNTER — Other Ambulatory Visit: Payer: Self-pay

## 2019-03-23 ENCOUNTER — Ambulatory Visit (INDEPENDENT_AMBULATORY_CARE_PROVIDER_SITE_OTHER): Payer: 59 | Admitting: Family Medicine

## 2019-03-23 ENCOUNTER — Encounter: Payer: Self-pay | Admitting: Family Medicine

## 2019-03-23 VITALS — BP 138/84 | HR 76 | Temp 97.8°F | Ht 77.0 in | Wt 346.0 lb

## 2019-03-23 DIAGNOSIS — E11621 Type 2 diabetes mellitus with foot ulcer: Secondary | ICD-10-CM

## 2019-03-23 DIAGNOSIS — Z23 Encounter for immunization: Secondary | ICD-10-CM

## 2019-03-23 DIAGNOSIS — I10 Essential (primary) hypertension: Secondary | ICD-10-CM | POA: Diagnosis not present

## 2019-03-23 DIAGNOSIS — E1165 Type 2 diabetes mellitus with hyperglycemia: Secondary | ICD-10-CM | POA: Diagnosis not present

## 2019-03-23 DIAGNOSIS — I152 Hypertension secondary to endocrine disorders: Secondary | ICD-10-CM

## 2019-03-23 DIAGNOSIS — E1159 Type 2 diabetes mellitus with other circulatory complications: Secondary | ICD-10-CM | POA: Diagnosis not present

## 2019-03-23 DIAGNOSIS — L97521 Non-pressure chronic ulcer of other part of left foot limited to breakdown of skin: Secondary | ICD-10-CM | POA: Diagnosis not present

## 2019-03-23 DIAGNOSIS — E1142 Type 2 diabetes mellitus with diabetic polyneuropathy: Secondary | ICD-10-CM | POA: Diagnosis not present

## 2019-03-23 DIAGNOSIS — E1169 Type 2 diabetes mellitus with other specified complication: Secondary | ICD-10-CM | POA: Diagnosis not present

## 2019-03-23 DIAGNOSIS — E785 Hyperlipidemia, unspecified: Secondary | ICD-10-CM | POA: Diagnosis not present

## 2019-03-23 LAB — CMP14+EGFR
ALT: 40 IU/L (ref 0–44)
AST: 41 IU/L — ABNORMAL HIGH (ref 0–40)
Albumin/Globulin Ratio: 1.8 (ref 1.2–2.2)
Albumin: 4.4 g/dL (ref 3.8–4.9)
Alkaline Phosphatase: 90 IU/L (ref 39–117)
BUN/Creatinine Ratio: 19 (ref 9–20)
BUN: 23 mg/dL (ref 6–24)
Bilirubin Total: 0.8 mg/dL (ref 0.0–1.2)
CO2: 28 mmol/L (ref 20–29)
Calcium: 10 mg/dL (ref 8.7–10.2)
Chloride: 94 mmol/L — ABNORMAL LOW (ref 96–106)
Creatinine, Ser: 1.19 mg/dL (ref 0.76–1.27)
GFR calc Af Amer: 77 mL/min/{1.73_m2} (ref >59)
GFR calc non Af Amer: 67 mL/min/{1.73_m2} (ref >59)
Globulin, Total: 2.5 g/dL (ref 1.5–4.5)
Glucose: 389 mg/dL — ABNORMAL HIGH (ref 65–99)
Potassium: 3.6 mmol/L (ref 3.5–5.2)
Sodium: 137 mmol/L (ref 134–144)
Total Protein: 6.9 g/dL (ref 6.0–8.5)

## 2019-03-23 LAB — BAYER DCA HB A1C WAIVED: HB A1C (BAYER DCA - WAIVED): 8.9 % — ABNORMAL HIGH (ref <7.0)

## 2019-03-23 LAB — LIPID PANEL
Chol/HDL Ratio: 3.4 ratio (ref 0.0–5.0)
Cholesterol, Total: 102 mg/dL (ref 100–199)
HDL: 30 mg/dL — ABNORMAL LOW (ref >39)
LDL Chol Calc (NIH): 25 mg/dL (ref 0–99)
Triglycerides: 324 mg/dL — ABNORMAL HIGH (ref 0–149)
VLDL Cholesterol Cal: 47 mg/dL — ABNORMAL HIGH (ref 5–40)

## 2019-03-23 MED ORDER — FREESTYLE LIBRE 14 DAY SENSOR MISC: 1.0000 [IU] | 3 refills | Status: DC

## 2019-03-23 MED ORDER — OZEMPIC (0.25 OR 0.5 MG/DOSE) 2 MG/1.5ML ~~LOC~~ SOPN: 0.5000 mg | PEN_INJECTOR | SUBCUTANEOUS | 2 refills | Status: DC

## 2019-03-23 MED ORDER — FREESTYLE LIBRE 14 DAY READER DEVI: 1.0000 [IU] | Freq: Once | 1 refills | Status: AC
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
## 2019-03-23 NOTE — Patient Instructions (Signed)
We have added Ozempic today given your uncontrolled blood sugar.  You will start at was 0.25 mg injected every 7 days for 4 weeks.  If you are tolerating the medication without difficulty, we will increase it to 0.5 mg injected every 7 days after 1 month.  I would like to see you back in about 1 month to review blood sugar log.  Televisit is fine.  Some nausea and abdominal pain may occur with this medication.  Mild nausea is normal.  Uncontrolled vomiting, severe abdominal pain or inability to keep fluids down is not normal and you should seek immediate medical attention should these things occur.  Weight loss is common with this medicine as it does tend to decrease your appetite and make you fuller faster.  Semaglutide injection solution What is this medicine? SEMAGLUTIDE (Sem a GLOO tide) is used to improve blood sugar control in adults with type 2 diabetes. This medicine may be used with other diabetes medicines. This drug may also reduce the risk of heart attack or stroke if you have type 2 diabetes and risk factors for heart disease. This medicine may be used for other purposes; ask your health care provider or pharmacist if you have questions. COMMON BRAND NAME(S): OZEMPIC What should I tell my health care provider before I take this medicine? They need to know if you have any of these conditions:  endocrine tumors (MEN 2) or if someone in your family had these tumors  eye disease, vision problems  history of pancreatitis  kidney disease  stomach problems  thyroid cancer or if someone in your family had thyroid cancer  an unusual or allergic reaction to semaglutide, other medicines, foods, dyes, or preservatives  pregnant or trying to get pregnant  breast-feeding How should I use this medicine? This medicine is for injection under the skin of your upper leg (thigh), stomach area, or upper arm. It is given once every week (every 7 days). You will be taught how to prepare and  give this medicine. Use exactly as directed. Take your medicine at regular intervals. Do not take it more often than directed. If you use this medicine with insulin, you should inject this medicine and the insulin separately. Do not mix them together. Do not give the injections right next to each other. Change (rotate) injection sites with each injection. It is important that you put your used needles and syringes in a special sharps container. Do not put them in a trash can. If you do not have a sharps container, call your pharmacist or healthcare provider to get one. A special MedGuide will be given to you by the pharmacist with each prescription and refill. Be sure to read this information carefully each time. Talk to your pediatrician regarding the use of this medicine in children. Special care may be needed. Overdosage: If you think you have taken too much of this medicine contact a poison control center or emergency room at once. NOTE: This medicine is only for you. Do not share this medicine with others. What if I miss a dose? If you miss a dose, take it as soon as you can within 5 days after the missed dose. Then take your next dose at your regular weekly time. If it has been longer than 5 days after the missed dose, do not take the missed dose. Take the next dose at your regular time. Do not take double or extra doses. If you have questions about a missed dose, contact your  health care provider for advice. What may interact with this medicine?  other medicines for diabetes Many medications may cause changes in blood sugar, these include:  alcohol containing beverages  antiviral medicines for HIV or AIDS  aspirin and aspirin-like drugs  certain medicines for blood pressure, heart disease, irregular heart beat  chromium  diuretics  male hormones, such as estrogens or progestins, birth control pills  fenofibrate  gemfibrozil  isoniazid  lanreotide  male hormones or  anabolic steroids  MAOIs like Carbex, Eldepryl, Marplan, Nardil, and Parnate  medicines for weight loss  medicines for allergies, asthma, cold, or cough  medicines for depression, anxiety, or psychotic disturbances  niacin  nicotine  NSAIDs, medicines for pain and inflammation, like ibuprofen or naproxen  octreotide  pasireotide  pentamidine  phenytoin  probenecid  quinolone antibiotics such as ciprofloxacin, levofloxacin, ofloxacin  some herbal dietary supplements  steroid medicines such as prednisone or cortisone  sulfamethoxazole; trimethoprim  thyroid hormones Some medications can hide the warning symptoms of low blood sugar (hypoglycemia). You may need to monitor your blood sugar more closely if you are taking one of these medications. These include:  beta-blockers, often used for high blood pressure or heart problems (examples include atenolol, metoprolol, propranolol)  clonidine  guanethidine  reserpine This list may not describe all possible interactions. Give your health care provider a list of all the medicines, herbs, non-prescription drugs, or dietary supplements you use. Also tell them if you smoke, drink alcohol, or use illegal drugs. Some items may interact with your medicine. What should I watch for while using this medicine? Visit your doctor or health care professional for regular checks on your progress. Drink plenty of fluids while taking this medicine. Check with your doctor or health care professional if you get an attack of severe diarrhea, nausea, and vomiting. The loss of too much body fluid can make it dangerous for you to take this medicine. A test called the HbA1C (A1C) will be monitored. This is a simple blood test. It measures your blood sugar control over the last 2 to 3 months. You will receive this test every 3 to 6 months. Learn how to check your blood sugar. Learn the symptoms of low and high blood sugar and how to manage  them. Always carry a quick-source of sugar with you in case you have symptoms of low blood sugar. Examples include hard sugar candy or glucose tablets. Make sure others know that you can choke if you eat or drink when you develop serious symptoms of low blood sugar, such as seizures or unconsciousness. They must get medical help at once. Tell your doctor or health care professional if you have high blood sugar. You might need to change the dose of your medicine. If you are sick or exercising more than usual, you might need to change the dose of your medicine. Do not skip meals. Ask your doctor or health care professional if you should avoid alcohol. Many nonprescription cough and cold products contain sugar or alcohol. These can affect blood sugar. Pens should never be shared. Even if the needle is changed, sharing may result in passing of viruses like hepatitis or HIV. Wear a medical ID bracelet or chain, and carry a card that describes your disease and details of your medicine and dosage times. Do not become pregnant while taking this medicine. Women should inform their doctor if they wish to become pregnant or think they might be pregnant. There is a potential for serious side  effects to an unborn child. Talk to your health care professional or pharmacist for more information. What side effects may I notice from receiving this medicine? Side effects that you should report to your doctor or health care professional as soon as possible:  allergic reactions like skin rash, itching or hives, swelling of the face, lips, or tongue  breathing problems  changes in vision  diarrhea that continues or is severe  lump or swelling on the neck  severe nausea  signs and symptoms of infection like fever or chills; cough; sore throat; pain or trouble passing urine  signs and symptoms of low blood sugar such as feeling anxious, confusion, dizziness, increased hunger, unusually weak or tired, sweating,  shakiness, cold, irritable, headache, blurred vision, fast heartbeat, loss of consciousness  signs and symptoms of kidney injury like trouble passing urine or change in the amount of urine  trouble swallowing  unusual stomach upset or pain  vomiting Side effects that usually do not require medical attention (report to your doctor or health care professional if they continue or are bothersome):  constipation  diarrhea  nausea  pain, redness, or irritation at site where injected  stomach upset This list may not describe all possible side effects. Call your doctor for medical advice about side effects. You may report side effects to FDA at 1-800-FDA-1088. Where should I keep my medicine? Keep out of the reach of children. Store unopened pens in a refrigerator between 2 and 8 degrees C (36 and 46 degrees F). Do not freeze. Protect from light and heat. After you first use the pen, it can be stored for 56 days at room temperature between 15 and 30 degrees C (59 and 86 degrees F) or in a refrigerator. Throw away your used pen after 56 days or after the expiration date, whichever comes first. Do not store your pen with the needle attached. If the needle is left on, medicine may leak from the pen. NOTE: This sheet is a summary. It may not cover all possible information. If you have questions about this medicine, talk to your doctor, pharmacist, or health care provider.  2020 Elsevier/Gold Standard (2018-06-04 14:25:32)

## 2019-03-28 ENCOUNTER — Other Ambulatory Visit: Payer: Self-pay | Admitting: *Deleted

## 2019-03-28 MED ORDER — ALLOPURINOL 300 MG PO TABS: 300.0000 mg | ORAL_TABLET | Freq: Every day | ORAL | 0 refills | Status: DC

## 2019-03-30 ENCOUNTER — Telehealth: Payer: Self-pay | Admitting: *Deleted

## 2019-03-30 ENCOUNTER — Other Ambulatory Visit: Payer: Self-pay | Admitting: Family Medicine

## 2019-03-30 DIAGNOSIS — E1165 Type 2 diabetes mellitus with hyperglycemia: Secondary | ICD-10-CM

## 2019-03-30 NOTE — Telephone Encounter (Signed)
Fax from [place] is non-formulary for patients plan Consider Dexcom or PA

## 2019-03-30 NOTE — Telephone Encounter (Signed)
He was aware that this may be the case.  I'm not so familiar with dexcom rx's.  I would recommend referral to endo for that and uncontrolled DM if he wants to go that route.  Please let patient know of insurance denial

## 2019-03-30 NOTE — Telephone Encounter (Signed)
Pt would like referral to Endo

## 2019-03-30 NOTE — Telephone Encounter (Signed)
Done

## 2019-04-05 ENCOUNTER — Other Ambulatory Visit: Payer: Self-pay | Admitting: *Deleted

## 2019-04-05 MED ORDER — ATORVASTATIN CALCIUM 20 MG PO TABS: 20.0000 mg | ORAL_TABLET | Freq: Every day | ORAL | 2 refills | Status: DC

## 2019-04-26 ENCOUNTER — Ambulatory Visit (INDEPENDENT_AMBULATORY_CARE_PROVIDER_SITE_OTHER): Payer: 59 | Admitting: Family Medicine

## 2019-04-26 DIAGNOSIS — E1142 Type 2 diabetes mellitus with diabetic polyneuropathy: Secondary | ICD-10-CM

## 2019-04-26 DIAGNOSIS — I10 Essential (primary) hypertension: Secondary | ICD-10-CM

## 2019-04-26 DIAGNOSIS — I152 Hypertension secondary to endocrine disorders: Secondary | ICD-10-CM

## 2019-04-26 DIAGNOSIS — E1159 Type 2 diabetes mellitus with other circulatory complications: Secondary | ICD-10-CM

## 2019-04-26 MED ORDER — ALLOPURINOL 300 MG PO TABS: 300.0000 mg | ORAL_TABLET | Freq: Every day | ORAL | 3 refills | Status: DC

## 2019-04-26 MED ORDER — SITAGLIPTIN PHOSPHATE 100 MG PO TABS: 100.0000 mg | ORAL_TABLET | Freq: Every day | ORAL | 1 refills | Status: DC

## 2019-04-26 MED ORDER — ACCU-CHEK FASTCLIX LANCETS MISC: 3 refills | Status: DC

## 2019-04-26 MED ORDER — LISINOPRIL 40 MG PO TABS: 40.0000 mg | ORAL_TABLET | Freq: Every day | ORAL | 3 refills | Status: DC
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
## 2019-04-28 ENCOUNTER — Encounter: Payer: Self-pay | Admitting: "Endocrinology

## 2019-04-28 ENCOUNTER — Ambulatory Visit (INDEPENDENT_AMBULATORY_CARE_PROVIDER_SITE_OTHER): Payer: 59 | Admitting: "Endocrinology

## 2019-04-28 ENCOUNTER — Other Ambulatory Visit: Payer: Self-pay

## 2019-04-28 VITALS — BP 135/86 | HR 79 | Ht 77.0 in | Wt 346.0 lb

## 2019-04-28 DIAGNOSIS — E1165 Type 2 diabetes mellitus with hyperglycemia: Secondary | ICD-10-CM | POA: Diagnosis not present

## 2019-04-28 DIAGNOSIS — I10 Essential (primary) hypertension: Secondary | ICD-10-CM | POA: Diagnosis not present

## 2019-04-28 DIAGNOSIS — E782 Mixed hyperlipidemia: Secondary | ICD-10-CM

## 2019-04-28 MED ORDER — METFORMIN HCL ER 500 MG PO TB24: 500.0000 mg | ORAL_TABLET | Freq: Every day | ORAL | 3 refills | Status: DC

## 2019-04-28 NOTE — Patient Instructions (Signed)
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
## 2019-05-18 ENCOUNTER — Other Ambulatory Visit: Payer: Self-pay | Admitting: Family Medicine

## 2019-05-18 DIAGNOSIS — E1159 Type 2 diabetes mellitus with other circulatory complications: Secondary | ICD-10-CM

## 2019-05-18 DIAGNOSIS — I152 Hypertension secondary to endocrine disorders: Secondary | ICD-10-CM

## 2019-05-18 MED ORDER — GLIPIZIDE 5 MG PO TABS: 5.0000 mg | ORAL_TABLET | Freq: Two times a day (BID) | ORAL | 1 refills | Status: DC

## 2019-05-18 NOTE — Addendum Note (Signed)
Addended by: Antonietta Barcelona D on: 05/18/2019 09:49 AM   Modules accepted: Orders

## 2019-05-26 ENCOUNTER — Telehealth: Payer: Self-pay | Admitting: "Endocrinology

## 2019-05-26 MED ORDER — METFORMIN HCL ER 500 MG PO TB24: 500.0000 mg | ORAL_TABLET | Freq: Every day | ORAL | 0 refills | Status: DC

## 2019-05-26 NOTE — Telephone Encounter (Signed)
Rx sent 

## 2019-05-26 NOTE — Telephone Encounter (Signed)
Patient said his metformin went to [facility] and it needs to go to [facility]

## 2019-06-06 ENCOUNTER — Encounter: Payer: Self-pay | Admitting: Urology

## 2019-06-14 ENCOUNTER — Ambulatory Visit: Payer: 59 | Admitting: *Deleted

## 2019-06-14 DIAGNOSIS — E1142 Type 2 diabetes mellitus with diabetic polyneuropathy: Secondary | ICD-10-CM

## 2019-06-14 NOTE — Chronic Care Management (AMB) (Signed)
  Chronic Care Management   Outreach Note  06/14/2019 Name: Anthony Logan MRN: [id number] DOB: 05-04-1961  Referred by: [name], DO Reason for referral : Chronic Care Management (RN follow up)   An unsuccessful telephone follow-up was attempted today. The patient was referred to the case management team by for assistance with care management and care coordination.   Follow Up Plan: A HIPPA compliant phone message was left for the patient providing contact information and requesting a return call.  The care management team will reach out to the patient again over the next 30 days.   [name], BSN, RN-BC Embedded Chronic Care Manager [facility] [place] [facility] Direct Dial: [phone]

## 2019-06-20 ENCOUNTER — Encounter: Payer: Self-pay | Admitting: Nutrition

## 2019-06-20 ENCOUNTER — Encounter: Payer: 59 | Attending: "Endocrinology | Admitting: Nutrition

## 2019-06-20 ENCOUNTER — Other Ambulatory Visit: Payer: Self-pay

## 2019-06-20 VITALS — Ht 77.0 in | Wt 343.0 lb

## 2019-06-20 DIAGNOSIS — E1165 Type 2 diabetes mellitus with hyperglycemia: Secondary | ICD-10-CM | POA: Diagnosis not present

## 2019-06-20 DIAGNOSIS — I10 Essential (primary) hypertension: Secondary | ICD-10-CM | POA: Diagnosis not present

## 2019-06-20 DIAGNOSIS — E11621 Type 2 diabetes mellitus with foot ulcer: Secondary | ICD-10-CM | POA: Insufficient documentation

## 2019-06-20 DIAGNOSIS — C641 Malignant neoplasm of right kidney, except renal pelvis: Secondary | ICD-10-CM | POA: Diagnosis not present

## 2019-06-20 DIAGNOSIS — G629 Polyneuropathy, unspecified: Secondary | ICD-10-CM

## 2019-06-20 DIAGNOSIS — L97521 Non-pressure chronic ulcer of other part of left foot limited to breakdown of skin: Secondary | ICD-10-CM | POA: Insufficient documentation

## 2019-06-20 DIAGNOSIS — Z6841 Body Mass Index (BMI) 40.0 and over, adult: Secondary | ICD-10-CM | POA: Diagnosis not present

## 2019-06-20 NOTE — Patient Instructions (Signed)
Goals Follow My Plate  Eat three balanced meals per day Watch portion sizes Avoid snacks unless veggies Don't eat past 7 pm Drink gallon of water per day. Test glucose level twice a day  Goal 80-130 in am and less than 180 before bed.

## 2019-06-20 NOTE — Progress Notes (Signed)
Medical Nutrition Therapy:  Appt start time: [id number] end time:  1630.   Assessment:  Primary concerns today: Diabetes Type 2, Morbid obesity, He lives with his wife.  He does the shopping and cooking. Has 2 small diabetic ulcers on each big toe. He is cleaning and wrapping them daily and they are healing per his report. Not seeing the wound clinic right now. Has a drop foot and flat feet and the orthotics he had rubbed a sore on his toe. Sees Dr. [name], Endocrinology. Changed recently: drinking a lot more water.  FBS 164-220's. Eats 2-3 meals per day. Eats some snacks at night. Currently on  Ozempic, Glipizide, Metfomin 500 mg once a day. Only testing once a day. Wants a dexcom. Has neuropathy in his hands and make it difficult to get blood out sometimes. Limited activity due to diabetic sores on both big toes. Willing to make changes with foods, meal planning and portions to improve his DM.  Lab Results  Component Value Date   HGBA1C 8.9 (H) 03/23/2019   CMP Latest Ref Rng & Units 03/23/2019 12/21/2018 07/28/2018  Glucose 65 - 99 mg/dL 389(H) 233(H) 191(H)  BUN 6 - 24 mg/dL 23 18 21   Creatinine 0.76 - 1.27 mg/dL 1.19 1.17 1.09  Sodium 134 - 144 mmol/L 137 140 140  Potassium 3.5 - 5.2 mmol/L 3.6 3.9 3.7  Chloride 96 - 106 mmol/L 94(L) 95(L) 98  CO2 20 - 29 mmol/L 28 27 32  Calcium 8.7 - 10.2 mg/dL 10.0 9.3 9.8  Total Protein 6.0 - 8.5 g/dL 6.9 6.6 -  Total Bilirubin 0.0 - 1.2 mg/dL 0.8 0.6 -  Alkaline Phos 39 - 117 IU/L 90 69 -  AST 0 - 40 IU/L 41(H) 24 -  ALT 0 - 44 IU/L 40 28 -   Lipid Panel     Component Value Date/Time   CHOL 102 03/23/2019 0835   TRIG 324 (H) 03/23/2019 0835   HDL 30 (L) 03/23/2019 0835   CHOLHDL 3.4 03/23/2019 0835   LDLCALC 25 03/23/2019 0835   LABVLDL 47 (H) 03/23/2019 0835   .   Preferred Learning Style:   No preference indicated   Learning Readiness:   Ready  Change in progress   MEDICATIONS:   DIETARY INTAKE:  24-hr recall:  Easts 2-3  meals per day. Eats snacks after super. Drinks water and diet green tea   Usual physical activity: ADL   Estimated energy needs: 2200 calories 248 g carbohydrates 165 g protein 61 g fat  Progress Towards Goal(s):  In progress.   Nutritional Diagnosis:  NB-1.1 Food and nutrition-related knowledge deficit As related to Diabetes Type 2.  As evidenced by A1C 8.9%.    Intervention:  Nutrition and Diabetes education provided on My Plate, CHO counting, meal planning, portion sizes, timing of meals, avoiding snacks between meals unless having a low blood sugar, target ranges for A1C and blood sugars, signs/symptoms and treatment of hyper/hypoglycemia, monitoring blood sugars, taking medications as prescribed, benefits of exercising 30 minutes per day and prevention of complications of DM.  Goals Follow My Plate  Eat three balanced meals per day Watch portion sizes Avoid snacks unless veggies Don't eat past 7 pm Drink gallon of water per day. Test glucose level twice a day  Goal 80-130 in am and less than 180 before bed.  Teaching Method Utilized:  Visual Auditory Hands on  Handouts given during visit include:  The Plate Method  Diabetes Instructions  Know your numbers  Barriers to learning/adherence to lifestyle change: none  Demonstrated degree of understanding via:  Teach Back   Monitoring/Evaluation:  Dietary intake, exercise, , and body weight in 1 month(s).

## 2019-06-23 ENCOUNTER — Other Ambulatory Visit: Payer: Self-pay

## 2019-06-24 ENCOUNTER — Encounter: Payer: Self-pay | Admitting: Family Medicine

## 2019-06-24 ENCOUNTER — Ambulatory Visit (INDEPENDENT_AMBULATORY_CARE_PROVIDER_SITE_OTHER): Payer: 59 | Admitting: Family Medicine

## 2019-06-24 VITALS — BP 135/83 | HR 72 | Temp 98.7°F | Ht >= 80 in | Wt 339.0 lb

## 2019-06-24 DIAGNOSIS — I10 Essential (primary) hypertension: Secondary | ICD-10-CM

## 2019-06-24 DIAGNOSIS — E1159 Type 2 diabetes mellitus with other circulatory complications: Secondary | ICD-10-CM

## 2019-06-24 DIAGNOSIS — L97521 Non-pressure chronic ulcer of other part of left foot limited to breakdown of skin: Secondary | ICD-10-CM | POA: Diagnosis not present

## 2019-06-24 DIAGNOSIS — I77810 Thoracic aortic ectasia: Secondary | ICD-10-CM

## 2019-06-24 DIAGNOSIS — I152 Hypertension secondary to endocrine disorders: Secondary | ICD-10-CM

## 2019-06-24 DIAGNOSIS — E785 Hyperlipidemia, unspecified: Secondary | ICD-10-CM

## 2019-06-24 DIAGNOSIS — E11621 Type 2 diabetes mellitus with foot ulcer: Secondary | ICD-10-CM | POA: Diagnosis not present

## 2019-06-24 DIAGNOSIS — E1169 Type 2 diabetes mellitus with other specified complication: Secondary | ICD-10-CM

## 2019-06-24 DIAGNOSIS — I700 Atherosclerosis of aorta: Secondary | ICD-10-CM | POA: Diagnosis not present

## 2019-06-24 DIAGNOSIS — E1142 Type 2 diabetes mellitus with diabetic polyneuropathy: Secondary | ICD-10-CM

## 2019-06-24 DIAGNOSIS — Z23 Encounter for immunization: Secondary | ICD-10-CM | POA: Diagnosis not present

## 2019-06-24 LAB — BAYER DCA HB A1C WAIVED: HB A1C (BAYER DCA - WAIVED): 7.3 % — ABNORMAL HIGH (ref <7.0)

## 2019-06-24 MED ORDER — METFORMIN HCL ER 500 MG PO TB24: 500.0000 mg | ORAL_TABLET | Freq: Every day | ORAL | 2 refills | Status: DC

## 2019-06-24 MED ORDER — GABAPENTIN 600 MG PO TABS: ORAL_TABLET | ORAL | 1 refills | Status: DC

## 2019-06-24 NOTE — Patient Instructions (Signed)
Your A1c is down to 7.3.  This is great.
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
## 2019-06-25 LAB — CMP14+EGFR
ALT: 27 IU/L (ref 0–44)
AST: 28 IU/L (ref 0–40)
Albumin/Globulin Ratio: 1.5 (ref 1.2–2.2)
Albumin: 4.1 g/dL (ref 3.8–4.9)
Alkaline Phosphatase: 78 IU/L (ref 39–117)
BUN/Creatinine Ratio: 21 — ABNORMAL HIGH (ref 9–20)
BUN: 26 mg/dL — ABNORMAL HIGH (ref 6–24)
Bilirubin Total: 0.7 mg/dL (ref 0.0–1.2)
CO2: 28 mmol/L (ref 20–29)
Calcium: 9.5 mg/dL (ref 8.7–10.2)
Chloride: 96 mmol/L (ref 96–106)
Creatinine, Ser: 1.21 mg/dL (ref 0.76–1.27)
GFR calc Af Amer: 76 mL/min/{1.73_m2} (ref >59)
GFR calc non Af Amer: 66 mL/min/{1.73_m2} (ref >59)
Globulin, Total: 2.8 g/dL (ref 1.5–4.5)
Glucose: 181 mg/dL — ABNORMAL HIGH (ref 65–99)
Potassium: 3.6 mmol/L (ref 3.5–5.2)
Sodium: 141 mmol/L (ref 134–144)
Total Protein: 6.9 g/dL (ref 6.0–8.5)

## 2019-06-25 LAB — LIPID PANEL
Chol/HDL Ratio: 3.4 ratio (ref 0.0–5.0)
Cholesterol, Total: 96 mg/dL — ABNORMAL LOW (ref 100–199)
HDL: 28 mg/dL — ABNORMAL LOW (ref >39)
LDL Chol Calc (NIH): 23 mg/dL (ref 0–99)
Triglycerides: 306 mg/dL — ABNORMAL HIGH (ref 0–149)
VLDL Cholesterol Cal: 45 mg/dL — ABNORMAL HIGH (ref 5–40)

## 2019-06-25 LAB — CBC
Hematocrit: 47.6 % (ref 37.5–51.0)
Hemoglobin: 16.1 g/dL (ref 13.0–17.7)
MCH: 32.3 pg (ref 26.6–33.0)
MCHC: 33.8 g/dL (ref 31.5–35.7)
MCV: 96 fL (ref 79–97)
Platelets: 263 10*3/uL (ref 150–450)
RBC: 4.98 x10E6/uL (ref 4.14–5.80)
RDW: 12.8 % (ref 11.6–15.4)
WBC: 10.1 10*3/uL (ref 3.4–10.8)

## 2019-06-29 ENCOUNTER — Telehealth: Payer: Self-pay | Admitting: Urology

## 2019-06-29 NOTE — Telephone Encounter (Signed)
This patient scheduled an appointment for f/u. He asks if he's suppose to have a Ct before the appointment.

## 2019-06-29 NOTE — Telephone Encounter (Signed)
Dr. Jeffie Pollock on said chest xray f/u. Does he need CT?

## 2019-06-30 ENCOUNTER — Ambulatory Visit (INDEPENDENT_AMBULATORY_CARE_PROVIDER_SITE_OTHER): Payer: 59 | Admitting: *Deleted

## 2019-06-30 DIAGNOSIS — E1142 Type 2 diabetes mellitus with diabetic polyneuropathy: Secondary | ICD-10-CM

## 2019-06-30 DIAGNOSIS — L97521 Non-pressure chronic ulcer of other part of left foot limited to breakdown of skin: Secondary | ICD-10-CM

## 2019-06-30 DIAGNOSIS — G629 Polyneuropathy, unspecified: Secondary | ICD-10-CM

## 2019-06-30 DIAGNOSIS — E11621 Type 2 diabetes mellitus with foot ulcer: Secondary | ICD-10-CM

## 2019-06-30 MED ORDER — GLIPIZIDE 5 MG PO TABS: 5.0000 mg | ORAL_TABLET | Freq: Two times a day (BID) | ORAL | 1 refills | Status: DC

## 2019-06-30 NOTE — Patient Instructions (Signed)
Visit Information  Goals Addressed            This Visit's Progress     Patient Stated   . "I want the ulcers on my toes to heal" (pt-stated)       Current Barriers:  . Chronic Disease Management support and education needs related to diabetic foot care  Nurse Case Manager Clinical Goal(s):  Marland Kitchen Over the next 30 days, the patient will demonstrate ongoing self health care management ability as evidenced by continued improvement in foot ulcerations*  Interventions:  . Discussed current condition of feet o Checks feet daily. Uses dressing techniques that he learned from the wound center o Reports that ulcerations are improving and that the newest one is almost healed . Chart Reviewed including recent office notes and labs. Dr Lajuana Ripple assessed feet at most recent visit.  . Encouraged patient to continue checking feet daily and to report any new or worsening symptoms or if continued improvement is not noted. . Provided education to patient re: diabetic foot care. o S/s of infection reviewed. o Continue to wear shoes and socks when walking o Keep feet dry . Discussed plans with patient for ongoing care management follow up and provided patient with direct contact information for care management team  Patient Self Care Activities:  . Performs ADL's independently . Performs IADL's independently  Please see past updates related to this goal by clicking on the "Past Updates" button in the selected goal      . "I want to keep my blood sugar under control" (pt-stated)       Current Barriers:  . Chronic Disease Management support and education needs related to diabetes  Nurse Case Manager Clinical Goal(s):  Marland Kitchen Over the next 30 days, patient will continue to work with dietician regarding diabetic meal planning . Over the next 90 days, patient will keep all scheduled medical appointments . Over the next 90 days, patient will demonstrate improved diabetes self management as evidenced by  blood sugar readings that are mostly within provided recommended range  Interventions:  . Evaluation of current treatment plan related to diabetes and patient's adherence to plan as established by provider. . Encouraged to continue with meal planning and eating appropriate portion sizes . Patient taking medications as prescribed . Discussed plans with patient for ongoing care management follow up and provided patient with direct contact information for care management team . Chart reviewed including recent office notes with dietician, endocrinologist, and PCP and review of recent labs . Medications reviewed and discussed o Glipizide refill sent in to [facility] per patient's request and per protocol . Upcoming appointments reviewed: [name], Dietician and Dr [name] on 07/27/2019 and Dr Lajuana Ripple in 09/2019  Patient Self Care Activities:  . Performs ADL's independently . Performs IADL's independently  Please see past updates related to this goal by clicking on the "Past Updates" button in the selected goal      . COMPLETED: "I'd like to get better control of my foot/leg discomfort so I can sleep better" (pt-stated)       Current Barriers:  Marland Kitchen Knowledge Deficits related to neuropathy management . Chronic Disease Management support and education needs related to sleep hygiene and peripheral neuropathy management  Nurse Case Manager Clinical Goal(s):  Marland Kitchen Over the next 30 days, patient will work with [facility] to address needs related to peripheral neuropathy and sleep disturbance  Interventions:  . Chart reviewed including recent office notes . Medications reviewed and discussed gabapentin o  Tolerating dose increase to 600mg  in the morning o Improvement in pain . Encouraged patient to continue current management and to follow-up with PCP if new symptoms develop or if current symptoms worsen  Patient Self Care Activities:  . Performs ADL's independently . Performs IADL's  independently  Please see past updates related to this goal by clicking on the "Past Updates" button in the selected goal        The care management team will reach out to the patient again over the next 90 days.    [name], BSN, RN-BC Embedded Chronic Care Manager [facility] [place] [facility] Direct Dial: [phone]   The patient verbalized understanding of instructions provided today and declined a print copy of patient instruction materials.

## 2019-06-30 NOTE — Chronic Care Management (AMB) (Signed)
Chronic Care Management   Follow Up Note   06/30/2019 Name: Anthony Logan MRN: [id number] DOB: [birth year]  Referred by: [name], DO Reason for referral : Chronic Care Management (RN Follow up)   Anthony Logan is a 59 y.o. year old male who is a primary care patient of [name], DO. The CCM team was consulted for assistance with chronic disease management and care coordination needs.    Review of patient status, including review of consultants reports, relevant laboratory and other test results, and collaboration with appropriate care team members and the patient's provider was performed as part of comprehensive patient evaluation and provision of chronic care management services.    Subjective: I spoke with Mr Anthony Logan by telephone today regarding his diabetes management.   Objective:  Outpatient Encounter Medications as of 06/30/2019  Medication Sig  . allopurinol (ZYLOPRIM) 300 MG tablet Take 1 tablet (300 mg total) by mouth daily.  Marland Kitchen aspirin 81 MG chewable tablet Chew 81 mg by mouth daily.  Marland Kitchen atorvastatin (LIPITOR) 20 MG tablet Take 1 tablet (20 mg total) by mouth daily.  Marland Kitchen buPROPion (WELLBUTRIN XL) 300 MG 24 hr tablet Take 1 tablet (300 mg total) by mouth daily.  . cholecalciferol (VITAMIN D) 1000 units tablet Take 1,000 Units by mouth daily.  . cyclobenzaprine (FLEXERIL) 10 MG tablet TAKE 1 TABLET AT BEDTIME  . furosemide (LASIX) 20 MG tablet TAKE 2 TABLETS (40 MG)     DAILY  . gabapentin (NEURONTIN) 600 MG tablet Take 1 tablet (600 mg total) by mouth daily with breakfast AND 1.5 tablets (900 mg total) at bedtime.  Marland Kitchen glipiZIDE (GLUCOTROL) 5 MG tablet Take 1 tablet (5 mg total) by mouth 2 (two) times daily before a meal.  . hydrALAZINE (APRESOLINE) 25 MG tablet Take 1 tablet (25 mg total) by mouth 3 (three) times daily.  . hydrochlorothiazide (HYDRODIURIL) 25 MG tablet Take 1 tablet (25 mg total) by mouth daily.  Marland Kitchen lisinopril (ZESTRIL) 40 MG tablet Take 1 tablet  (40 mg total) by mouth daily.  . metFORMIN (GLUCOPHAGE XR) 500 MG 24 hr tablet Take 1 tablet (500 mg total) by mouth daily with breakfast.  . metoprolol tartrate (LOPRESSOR) 100 MG tablet Take 2 tablets (200 mg total) by mouth 2 (two) times daily.  . Semaglutide,0.25 or 0.5MG /DOS, (OZEMPIC, 0.25 OR 0.5 MG/DOSE,) 2 MG/1.5ML SOPN Inject 0.5 mg into the skin every 7 (seven) days.  . [DISCONTINUED] glipiZIDE (GLUCOTROL) 5 MG tablet Take 1 tablet (5 mg total) by mouth 2 (two) times daily before a meal.   No facility-administered encounter medications on file as of 06/30/2019.    Lab Results  Component Value Date   HGBA1C 7.3 (H) 06/24/2019   HGBA1C 8.9 (H) 03/23/2019   HGBA1C 7.1 (H) 12/21/2018   Lab Results  Component Value Date   LDLCALC 23 06/24/2019   CREATININE 1.21 06/24/2019     RN Assessment & Care Plan   . "I want the ulcers on my toes to heal"        Current Barriers:  . Chronic Disease Management support and education needs related to diabetic foot care  Nurse Case Manager Clinical Goal(s):  Marland Kitchen Over the next 30 days, the patient will demonstrate ongoing self health care management ability as evidenced by continued improvement in foot ulcerations*  Interventions:  . Discussed current condition of feet o Checks feet daily. Uses dressing techniques that he learned from the wound center o Reports that ulcerations are  improving and that the newest one is almost healed . Chart Reviewed including recent office notes and labs. Dr Lajuana Ripple assessed feet at most recent visit.  . Encouraged patient to continue checking feet daily and to report any new or worsening symptoms or if continued improvement is not noted. . Provided education to patient re: diabetic foot care. o S/s of infection reviewed. o Continue to wear shoes and socks when walking o Keep feet dry . Discussed plans with patient for ongoing care management follow up and provided patient with direct contact information  for care management team  Patient Self Care Activities:  . Performs ADL's independently . Performs IADL's independently  Please see past updates related to this goal by clicking on the "Past Updates" button in the selected goal      . "I want to keep my blood sugar under control" (pt-stated)       Current Barriers:  . Chronic Disease Management support and education needs related to diabetes  Nurse Case Manager Clinical Goal(s):  Marland Kitchen Over the next 30 days, patient will continue to work with dietician regarding diabetic meal planning . Over the next 90 days, patient will keep all scheduled medical appointments . Over the next 90 days, patient will demonstrate improved diabetes self management as evidenced by blood sugar readings that are mostly within provided recommended range  Interventions:  . Evaluation of current treatment plan related to diabetes and patient's adherence to plan as established by provider. . Encouraged to continue with meal planning and eating appropriate portion sizes . Patient taking medications as prescribed . Discussed plans with patient for ongoing care management follow up and provided patient with direct contact information for care management team . Chart reviewed including recent office notes with dietician, endocrinologist, and PCP and review of recent labs . Medications reviewed and discussed o Glipizide refill sent in to [facility] per patient's request and per protocol . Upcoming appointments reviewed: [name], Dietician and Dr [name] on 07/27/2019 and Dr Lajuana Ripple in 09/2019  Patient Self Care Activities:  . Performs ADL's independently . Performs IADL's independently  Please see past updates related to this goal by clicking on the "Past Updates" button in the selected goal      . COMPLETED: "I'd like to get better control of my foot/leg discomfort so I can sleep better" (pt-stated)       Current Barriers:  Marland Kitchen Knowledge Deficits related to  neuropathy management . Chronic Disease Management support and education needs related to sleep hygiene and peripheral neuropathy management  Nurse Case Manager Clinical Goal(s):  Marland Kitchen Over the next 30 days, patient will work with [facility] to address needs related to peripheral neuropathy and sleep disturbance  Interventions:  . Chart reviewed including recent office notes . Medications reviewed and discussed gabapentin o Tolerating dose increase to 600mg  in the morning o Improvement in pain . Encouraged patient to continue current management and to follow-up with PCP if new symptoms develop or if current symptoms worsen  Patient Self Care Activities:  . Performs ADL's independently . Performs IADL's independently  Please see past updates related to this goal by clicking on the "Past Updates" button in the selected goal          Follow-up Plan:   The care management team will reach out to the patient again over the next 90 days.    SIGNATURE

## 2019-07-27 ENCOUNTER — Other Ambulatory Visit: Payer: Self-pay

## 2019-07-27 ENCOUNTER — Encounter: Payer: Self-pay | Admitting: "Endocrinology

## 2019-07-27 ENCOUNTER — Encounter: Payer: 59 | Attending: "Endocrinology | Admitting: Nutrition

## 2019-07-27 ENCOUNTER — Ambulatory Visit (INDEPENDENT_AMBULATORY_CARE_PROVIDER_SITE_OTHER): Payer: 59 | Admitting: "Endocrinology

## 2019-07-27 ENCOUNTER — Encounter: Payer: Self-pay | Admitting: Nutrition

## 2019-07-27 VITALS — BP 155/90 | HR 71 | Ht 77.0 in | Wt 342.4 lb

## 2019-07-27 VITALS — Ht 77.0 in | Wt 342.0 lb

## 2019-07-27 DIAGNOSIS — L97521 Non-pressure chronic ulcer of other part of left foot limited to breakdown of skin: Secondary | ICD-10-CM | POA: Diagnosis not present

## 2019-07-27 DIAGNOSIS — I10 Essential (primary) hypertension: Secondary | ICD-10-CM

## 2019-07-27 DIAGNOSIS — E1165 Type 2 diabetes mellitus with hyperglycemia: Secondary | ICD-10-CM

## 2019-07-27 DIAGNOSIS — E782 Mixed hyperlipidemia: Secondary | ICD-10-CM | POA: Diagnosis not present

## 2019-07-27 DIAGNOSIS — E11621 Type 2 diabetes mellitus with foot ulcer: Secondary | ICD-10-CM | POA: Diagnosis not present

## 2019-07-27 LAB — POCT GLYCOSYLATED HEMOGLOBIN (HGB A1C): Hemoglobin A1C: 7.6 % — AB (ref 4.0–5.6)

## 2019-07-27 MED ORDER — OZEMPIC (1 MG/DOSE) 2 MG/1.5ML ~~LOC~~ SOPN: 1.0000 mg | PEN_INJECTOR | SUBCUTANEOUS | 2 refills | Status: DC

## 2019-07-27 MED ORDER — ACCU-CHEK FASTCLIX LANCETS MISC: 2 refills | Status: DC

## 2019-07-27 NOTE — Patient Instructions (Signed)
Goals  Walk 15 minutes in house or chair exercise daily. Cut out ramen noodles Increase fiber rich foods in diet-fresh fruits and vegetables. Eat before 7 pm. Cut out junk food. Drink gallon or more of water per day.
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
## 2019-07-27 NOTE — Progress Notes (Signed)
Medical Nutrition Therapy:  Appt start time: 0830 end time:  0900   Assessment:  Primary concerns today: Diabetes Type 2, Morbid obesity,  Eating smaller portions. Cooking more at home. Blood sugars Last 30 days 181 mg/dl and range 150-228 mg/dl.  Lost 4 lbs since December 2020. Diet remains low in fresh fruits, vegetables and whole grains. Complains of constipation. Drinks a gallon of water per day. No physically active due to ulcer on toe and drop foot. Willing to try to increase vegetables and fruit and try chair exercises. Diet is high in sodium and fat.  Saw Dr. [name] today. A1C 7.6% up from 7.3 but down from 8.9% overall. Results for PARV, RASHID" (MRN [id number]) as of 07/27/2019 09:07  Ref. Range 03/23/2019 08:26 03/23/2019 08:35 06/24/2019 08:05 06/24/2019 09:36 07/27/2019 08:19  Hemoglobin A1C Latest Ref Range: 4.0 - 5.6 %     7.6 (A)  HB A1C (BAYER DCA - WAIVED) Latest Ref Range: <7.0 % 8.9 (H)  7.3 (H)     Lab Results  Component Value Date   HGBA1C 7.6 (A) 07/27/2019   CMP Latest Ref Rng & Units 06/24/2019 03/23/2019 12/21/2018  Glucose 65 - 99 mg/dL 181(H) 389(H) 233(H)  BUN 6 - 24 mg/dL 26(H) 23 18  Creatinine 0.76 - 1.27 mg/dL 1.21 1.19 1.17  Sodium 134 - 144 mmol/L 141 137 140  Potassium 3.5 - 5.2 mmol/L 3.6 3.6 3.9  Chloride 96 - 106 mmol/L 96 94(L) 95(L)  CO2 20 - 29 mmol/L 28 28 27   Calcium 8.7 - 10.2 mg/dL 9.5 10.0 9.3  Total Protein 6.0 - 8.5 g/dL 6.9 6.9 6.6  Total Bilirubin 0.0 - 1.2 mg/dL 0.7 0.8 0.6  Alkaline Phos 39 - 117 IU/L 78 90 69  AST 0 - 40 IU/L 28 41(H) 24  ALT 0 - 44 IU/L 27 40 28   Lipid Panel     Component Value Date/Time   CHOL 96 (L) 06/24/2019 0936   TRIG 306 (H) 06/24/2019 0936   HDL 28 (L) 06/24/2019 0936   CHOLHDL 3.4 06/24/2019 0936   LDLCALC 23 06/24/2019 0936   LABVLDL 45 (H) 06/24/2019 0936   .   Preferred Learning Style:   No preference indicated   Learning Readiness:   Ready  Change in progress   MEDICATIONS:    DIETARY INTAKE:  24-hr recall:  B Bacon, egg and cheese whole wheat bread, coffee creamer L ramen noodles, water D) Sloppy joes 2, chips, water Popcorn  1 cup, water  Usual physical activity: ADL   Estimated energy needs: 2200 calories 248 g carbohydrates 165 g protein 61 g fat  Progress Towards Goal(s):  In progress.   Nutritional Diagnosis:  NB-1.1 Food and nutrition-related knowledge deficit As related to Diabetes Type 2.  As evidenced by A1C 8.9%.    Intervention:  Nutrition and Diabetes education provided on My Plate, CHO counting, meal planning, portion sizes, timing of meals, avoiding snacks between meals unless having a low blood sugar, target ranges for A1C and blood sugars, signs/symptoms and treatment of hyper/hypoglycemia, monitoring blood sugars, taking medications as prescribed, benefits of exercising 30 minutes per day and prevention of complications of DM.  Goals  Walk 15 minutes in house or chair exercise daily. Cut out ramen noodles Increase fiber rich foods in diet-fresh fruits and vegetables. Eat before 7 pm. Cut out junk food. Drink gallon or more of water per day.  Teaching Method Utilized:  Visual Auditory Hands on  Handouts given  during visit include:  The Plate Method  Diabetes Instructions  Know your numbers   Barriers to learning/adherence to lifestyle change: none  Demonstrated degree of understanding via:  Teach Back  Monitoring/Evaluation:  Dietary intake, exercise, , and body weight in 3 month(s).

## 2019-07-27 NOTE — Patient Instructions (Signed)

## 2019-08-02 ENCOUNTER — Other Ambulatory Visit: Payer: Self-pay

## 2019-08-02 MED ORDER — ACCU-CHEK FASTCLIX LANCETS MISC: 2 refills | Status: DC

## 2019-08-02 MED ORDER — OZEMPIC (1 MG/DOSE) 2 MG/1.5ML ~~LOC~~ SOPN: 1.0000 mg | PEN_INJECTOR | SUBCUTANEOUS | 2 refills | Status: DC

## 2019-08-04 ENCOUNTER — Telehealth: Payer: Self-pay | Admitting: "Endocrinology

## 2019-08-04 NOTE — Telephone Encounter (Signed)
PA submitted to [facility] for Ozempic. Awaiting response

## 2019-08-11 ENCOUNTER — Ambulatory Visit: Payer: Medicare HMO | Attending: Internal Medicine

## 2019-08-11 ENCOUNTER — Other Ambulatory Visit: Payer: Self-pay | Admitting: "Endocrinology

## 2019-08-11 DIAGNOSIS — Z23 Encounter for immunization: Secondary | ICD-10-CM

## 2019-08-11 NOTE — Progress Notes (Signed)
   Covid-19 Vaccination Clinic  Name:  ELIO GILLENWATER    MRN: [id number] DOB: 1960-07-12  08/11/2019  Mr. Clair was observed post Covid-19 immunization for 30 minutes based on pre-vaccination screening without incident. He was provided with Vaccine Information Sheet and instruction to access the V-Safe system.   Mr. Annunziato was instructed to call 911 with any severe reactions post vaccine: Marland Kitchen Difficulty breathing  . Swelling of face and throat  . A fast heartbeat  . A bad rash all over body  . Dizziness and weakness   Immunizations Administered    Name Date Dose VIS Date Route   Moderna COVID-19 Vaccine 08/11/2019  8:14 AM 0.5 mL 04/19/2019 Intramuscular   Manufacturer: Moderna   Lot: [id number]   [id number]
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
## 2019-08-23 ENCOUNTER — Ambulatory Visit: Payer: Medicare HMO | Admitting: Family Medicine
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
## 2019-08-26 ENCOUNTER — Ambulatory Visit (INDEPENDENT_AMBULATORY_CARE_PROVIDER_SITE_OTHER): Payer: 59 | Admitting: Family Medicine

## 2019-08-26 ENCOUNTER — Encounter: Payer: Self-pay | Admitting: Family Medicine

## 2019-08-26 ENCOUNTER — Other Ambulatory Visit: Payer: Self-pay

## 2019-08-26 ENCOUNTER — Telehealth: Payer: Self-pay | Admitting: Family Medicine

## 2019-08-26 VITALS — BP 148/82 | HR 72 | Ht 77.0 in | Wt 334.0 lb

## 2019-08-26 DIAGNOSIS — R0602 Shortness of breath: Secondary | ICD-10-CM

## 2019-08-26 DIAGNOSIS — I351 Nonrheumatic aortic (valve) insufficiency: Secondary | ICD-10-CM | POA: Diagnosis not present

## 2019-08-26 DIAGNOSIS — I10 Essential (primary) hypertension: Secondary | ICD-10-CM

## 2019-08-26 DIAGNOSIS — I77810 Thoracic aortic ectasia: Secondary | ICD-10-CM

## 2019-08-26 MED ORDER — HYDRALAZINE HCL 50 MG PO TABS: 50.0000 mg | ORAL_TABLET | Freq: Three times a day (TID) | ORAL | 3 refills | Status: DC

## 2019-08-26 NOTE — Patient Instructions (Addendum)
Medication Instructions:   Your physician has recommended you make the following change in your medication:   Increase hydralazine to 50 mg by mouth three times daily. You may take (2) of your 25 mg tablets three times daily until they are finished  Continue other medications the same  Labwork:  Your physician recommends that you return for lab work TODAY to check your BMET prior to your ct scan.  Testing/Procedures:  Non-Cardiac CT Angiography (CTA), is a special type of CT scan that uses a computer to produce multi-dimensional views of major blood vessels throughout the body. In CT angiography, a contrast material is injected through an IV to help visualize the blood vessels  Follow-Up:  Your physician recommends that you schedule a follow-up appointment in: 3 months (office).  Any Other Special Instructions Will Be Listed Below (If Applicable). Your physician has requested that you regularly monitor and record your blood pressure readings at home. Please use the same machine at the same time of day to check your readings and record them to bring to your follow-up visit. If your blood pressure is staying higher than 130/80 consecutively, please contact our office.  If you need a refill on your cardiac medications before your next appointment, please call your pharmacy.

## 2019-08-26 NOTE — Telephone Encounter (Signed)
Pre-cert Verification for the following procedure     CT ANGIO CHEST AORTA W/CM &/OR  DATE:  09/14/2019  LOCATION: [facility]

## 2019-08-29 ENCOUNTER — Telehealth: Payer: Self-pay | Admitting: *Deleted

## 2019-08-29 ENCOUNTER — Other Ambulatory Visit: Payer: Self-pay | Admitting: Family Medicine

## 2019-08-29 LAB — BASIC METABOLIC PANEL WITH GFR
BUN: 22 mg/dL (ref 7–25)
CO2: 32 mmol/L (ref 20–32)
Calcium: 10 mg/dL (ref 8.6–10.3)
Chloride: 97 mmol/L — ABNORMAL LOW (ref 98–110)
Creat: 1.02 mg/dL (ref 0.70–1.33)
GFR, Est African American: 93 mL/min/{1.73_m2} (ref >=60)
GFR, Est Non African American: 81 mL/min/{1.73_m2} (ref >=60)
Glucose, Bld: 196 mg/dL — ABNORMAL HIGH (ref 65–139)
Potassium: 3.8 mmol/L (ref 3.5–5.3)
Sodium: 139 mmol/L (ref 135–146)

## 2019-08-29 NOTE — Telephone Encounter (Signed)
Patient informed. Copy sent to PCP °

## 2019-08-29 NOTE — Telephone Encounter (Signed)
-----   Message from Verta Ellen., NP sent at 08/29/2019  4:01 PM EDT ----- Please call the patient and tell him his lab work looked good except the Glucose continues to be elevated. Follow up with PCP regarding his glucose of 186. I am assuming this was a fasting sample.

## 2019-08-31 ENCOUNTER — Ambulatory Visit (INDEPENDENT_AMBULATORY_CARE_PROVIDER_SITE_OTHER): Payer: 59 | Admitting: Urology

## 2019-08-31 ENCOUNTER — Ambulatory Visit (HOSPITAL_COMMUNITY)
Admission: RE | Admit: 2019-08-31 | Discharge: 2019-08-31 | Disposition: A | Discharge status: Home / Self Care | Payer: 59 | Source: Ambulatory Visit | Attending: Urology | Admitting: Urology

## 2019-08-31 ENCOUNTER — Other Ambulatory Visit: Payer: Self-pay

## 2019-08-31 ENCOUNTER — Encounter: Payer: Self-pay | Admitting: Urology

## 2019-08-31 VITALS — BP 145/84 | HR 71 | Temp 96.4°F | Ht 77.0 in | Wt 336.0 lb

## 2019-08-31 DIAGNOSIS — C641 Malignant neoplasm of right kidney, except renal pelvis: Secondary | ICD-10-CM | POA: Diagnosis not present

## 2019-08-31 DIAGNOSIS — C649 Malignant neoplasm of unspecified kidney, except renal pelvis: Secondary | ICD-10-CM | POA: Diagnosis not present
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
## 2019-08-31 NOTE — Progress Notes (Signed)

## 2019-08-31 NOTE — Patient Instructions (Signed)
Renal Mass  A renal mass is a growth in the kidney. A renal mass may be found while performing an MRI, CT scan, or ultrasound for other problems of the abdomen. Certain types of cancers, infections, or injuries can cause a renal mass. A renal mass that is cancerous (malignant) may grow or spread quickly. Others are harmless (benign). What are common types of renal masses? Renal masses include:  Tumors. These may be cancerous (malignant) or noncancerous (benign). ? The most common type of kidney cancer is renal cell carcinoma. ? The most common benign tumors of the kidney include renal adenomas, oncocytomas, and angiomyolipoma (AML).  Cysts. These are fluid-filled sacs that form on or in the kidney. ? It is not always known what causes a cyst to develop in or on the kidney. ? Most kidney cysts do not cause symptoms and do not need to be treated. What type of testing might I need? Your health care provider may recommend that you have tests to diagnose the cause of your renal mass. The following tests may be done if a renal mass is found:  Physical exam.  Blood tests.  Urine tests.  Imaging tests, such as ultrasound, CT scan, or MRI.  Biopsy. This is a small sample that is removed from the renal mass and tested in a lab. The exact tests and how often they are done will depend on:  The size and appearance of the renal mass.  Risk factors or medical conditions that increase your risk for problems.  Any symptoms associated with the renal mass, or concerns that you have about it. Tests and physical exams may be done once, or they may be done regularly for a period of time. Tests and exams that are done regularly will help monitor whether the mass is growing and beginning to cause problems. What are common treatments for renal masses? Treatment is not always needed for this condition. Your health care provider may recommend careful monitoring (watchful waiting) and regular tests and exams.  Treatment will depend on the cause of the mass. Follow these instructions at home: What you need to do at home will depend on the cause of the mass. Follow the instructions that your health care provider gives to you. In general:  Take over-the-counter and prescription medicines only as told by your health care provider.  If you are prescribed an antibiotic medicine, take it as told by your health care provider. Do not stop taking the antibiotic even if you start to feel better.  Follow any restrictions that are given to you by your health care provider.  Keep all follow-up visits as told by your health care provider. This is important. ? You may need to see your health care provider once or twice a year to have CT scans and ultrasounds done. These tests will show if your renal mass has changed or grown bigger. Contact a health care provider if you:  Have pain in the side or back (flank pain).  Have a fever.  Feel full soon after eating.  Have pain or swelling in the abdomen.  Lose weight. Get help right away if:  Your pain gets worse.  There is blood in your urine.  You cannot urinate.  You have chest pain.  You have trouble breathing. Summary  A renal mass is a growth in the kidney. It may be cancerous (malignant) and grow or spread quickly, or it may be harmless (benign).  Renal masses may be found while performing   an MRI, CT scan, or ultrasound for other problems of the abdomen.  Your health care provider may recommend that you have tests to diagnose the cause of your renal mass. This may include a physical exam, blood tests, urine tests, imaging, or a biopsy.  Treatment is not always needed for this condition. Careful monitoring (watchful waiting) may be recommended. This information is not intended to replace advice given to you by your health care provider. Make sure you discuss any questions you have with your health care provider. Document Revised: 06/11/2017  Document Reviewed: 06/11/2017 Elsevier Patient Education  2020 Elsevier Inc.  

## 2019-09-13 ENCOUNTER — Other Ambulatory Visit: Payer: Self-pay | Admitting: Family Medicine

## 2019-09-13 ENCOUNTER — Ambulatory Visit: Payer: Medicare HMO | Attending: Internal Medicine

## 2019-09-13 DIAGNOSIS — F329 Major depressive disorder, single episode, unspecified: Secondary | ICD-10-CM

## 2019-09-13 DIAGNOSIS — I152 Hypertension secondary to endocrine disorders: Secondary | ICD-10-CM

## 2019-09-13 DIAGNOSIS — Z23 Encounter for immunization: Secondary | ICD-10-CM

## 2019-09-13 DIAGNOSIS — F32A Depression, unspecified: Secondary | ICD-10-CM

## 2019-09-13 DIAGNOSIS — E1159 Type 2 diabetes mellitus with other circulatory complications: Secondary | ICD-10-CM

## 2019-09-13 NOTE — Progress Notes (Signed)
   Covid-19 Vaccination Clinic  Name:  Anthony Logan    MRN: [id number] DOB: 09/12/1960  09/13/2019  Anthony Logan was observed post Covid-19 immunization for 15 minutes without incident. He was provided with Vaccine Information Sheet and instruction to access the V-Safe system.   Anthony Logan was instructed to call 911 with any severe reactions post vaccine: Marland Kitchen Difficulty breathing  . Swelling of face and throat  . A fast heartbeat  . A bad rash all over body  . Dizziness and weakness   Immunizations Administered    Name Date Dose VIS Date Route   Moderna COVID-19 Vaccine 09/13/2019  8:05 AM 0.5 mL 04/2019 Intramuscular   Manufacturer: Moderna   Lot: [id number]   [id number]

## 2019-09-14 ENCOUNTER — Ambulatory Visit (HOSPITAL_COMMUNITY)
Admission: RE | Admit: 2019-09-14 | Discharge: 2019-09-14 | Disposition: A | Discharge status: Home / Self Care | Payer: 59 | Source: Ambulatory Visit | Attending: Family Medicine | Admitting: Family Medicine

## 2019-09-14 ENCOUNTER — Other Ambulatory Visit: Payer: Self-pay

## 2019-09-14 DIAGNOSIS — I712 Thoracic aortic aneurysm, without rupture: Secondary | ICD-10-CM | POA: Diagnosis not present

## 2019-09-14 DIAGNOSIS — I77810 Thoracic aortic ectasia: Secondary | ICD-10-CM | POA: Diagnosis not present

## 2019-09-14 MED ORDER — IOHEXOL 350 MG/ML SOLN
100.0000 mL | Freq: Once | INTRAVENOUS | Status: AC | PRN
  Administered 2019-09-14: 100 mL via INTRAVENOUS

## 2019-09-15 ENCOUNTER — Telehealth: Payer: Self-pay | Admitting: *Deleted

## 2019-09-15 NOTE — Telephone Encounter (Signed)
-----   Message from Verta Ellen., NP sent at 09/14/2019  4:56 PM EDT ----- Please call the patient and let him know the dilated aorta is stable and has not changed in size. Just let him know guidelines say that a scan needs to be done at least yearly to make sure this does not become larger. Thank You

## 2019-09-15 NOTE — Telephone Encounter (Signed)
Patient informed. Copy sent to PCP °

## 2019-09-23 ENCOUNTER — Ambulatory Visit: Payer: 59 | Admitting: Family Medicine

## 2019-09-27 ENCOUNTER — Ambulatory Visit: Payer: 59 | Admitting: *Deleted

## 2019-09-27 NOTE — Chronic Care Management (AMB) (Signed)
  Chronic Care Management   Follow-up Note  09/27/2019 Name: Anthony Logan MRN: [id number] DOB: Oct 25, 1960  Referred by: [name], DO Reason for referral : Chronic Care Management (RN follow up)   An unsuccessful telephone follow-up was attempted today. The patient was referred to the case management team for assistance with care management and care coordination.   Follow Up Plan: The care management team will reach out to the patient again over the next 30 days.   [name], BSN, RN-BC Embedded Chronic Care Manager [facility] Direct Dial: [phone]

## 2019-10-04 ENCOUNTER — Encounter (HOSPITAL_COMMUNITY): Payer: Self-pay | Admitting: Emergency Medicine

## 2019-10-04 ENCOUNTER — Emergency Department (HOSPITAL_COMMUNITY): Payer: 59

## 2019-10-04 ENCOUNTER — Other Ambulatory Visit: Payer: Self-pay

## 2019-10-04 ENCOUNTER — Emergency Department (HOSPITAL_COMMUNITY)
Admission: EM | Admit: 2019-10-04 | Discharge: 2019-10-04 | Disposition: A | Discharge status: Home / Self Care | Payer: 59 | Attending: Emergency Medicine | Admitting: Emergency Medicine

## 2019-10-04 DIAGNOSIS — E119 Type 2 diabetes mellitus without complications: Secondary | ICD-10-CM | POA: Insufficient documentation

## 2019-10-04 DIAGNOSIS — Z79899 Other long term (current) drug therapy: Secondary | ICD-10-CM | POA: Diagnosis not present

## 2019-10-04 DIAGNOSIS — I10 Essential (primary) hypertension: Secondary | ICD-10-CM | POA: Insufficient documentation

## 2019-10-04 DIAGNOSIS — Z7982 Long term (current) use of aspirin: Secondary | ICD-10-CM | POA: Diagnosis not present

## 2019-10-04 DIAGNOSIS — Z87891 Personal history of nicotine dependence: Secondary | ICD-10-CM | POA: Insufficient documentation

## 2019-10-04 DIAGNOSIS — Z7984 Long term (current) use of oral hypoglycemic drugs: Secondary | ICD-10-CM | POA: Insufficient documentation

## 2019-10-04 DIAGNOSIS — R2241 Localized swelling, mass and lump, right lower limb: Secondary | ICD-10-CM | POA: Diagnosis present

## 2019-10-04 DIAGNOSIS — L03115 Cellulitis of right lower limb: Secondary | ICD-10-CM | POA: Diagnosis not present

## 2019-10-04 MED ORDER — CLINDAMYCIN HCL 150 MG PO CAPS
150.0000 mg | ORAL_CAPSULE | Freq: Four times a day (QID) | ORAL | 0 refills | Status: DC
Start: 2019-10-04 — End: 2019-10-21
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
## 2019-10-04 NOTE — ED Triage Notes (Signed)
Pt reports possible cellulitis to right lower extremity since yesterday, denies fever and drainage

## 2019-10-04 NOTE — Discharge Instructions (Addendum)
You are seen for right leg swelling.  Your ultrasounds showed no blood clot.  We are going to treat you for a possible infection.  It is very important you follow-up with your regular doctor in 2 days, or return to the emergency department if you have any new or worsening symptoms.
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
## 2019-10-07 ENCOUNTER — Other Ambulatory Visit: Payer: Self-pay

## 2019-10-07 ENCOUNTER — Ambulatory Visit (INDEPENDENT_AMBULATORY_CARE_PROVIDER_SITE_OTHER): Payer: 59 | Admitting: Nurse Practitioner

## 2019-10-07 ENCOUNTER — Encounter: Payer: Self-pay | Admitting: Nurse Practitioner

## 2019-10-07 VITALS — BP 107/72 | HR 76 | Temp 98.4°F | Resp 20 | Ht 77.0 in | Wt 332.0 lb

## 2019-10-07 DIAGNOSIS — L03115 Cellulitis of right lower limb: Secondary | ICD-10-CM | POA: Insufficient documentation

## 2019-10-07 NOTE — Patient Instructions (Addendum)
Cellulitis of right leg Cellulitis of right leg is well controlled and responding to current treatment. No changes or adjustments needed for current medication. Provided education to patient with printed handout on the importance of taking medication as prescribed. Cleocin 150 mg ordered 4 times a day, patient is only taking medication 3 times a day.. Pt knows to follow up with unresolved or worsening symptoms    Cellulitis, Adult  Cellulitis is a skin infection. The infected area is often warm, red, swollen, and sore. It occurs most often in the arms and lower legs. It is very important to get treated for this condition. What are the causes? This condition is caused by bacteria. The bacteria enter through a break in the skin, such as a cut, burn, insect bite, open sore, or crack. What increases the risk? This condition is more likely to occur in people who:  Have a weak body defense system (immune system).  Have open cuts, burns, bites, or scrapes on the skin.  Are older than 59 years of age.  Have a blood sugar problem (diabetes).  Have a long-lasting (chronic) liver disease (cirrhosis) or kidney disease.  Are very overweight (obese).  Have a skin problem, such as: ? Itchy rash (eczema). ? Slow movement of blood in the veins (venous stasis). ? Fluid buildup below the skin (edema).  Have been treated with high-energy rays (radiation).  Use IV drugs. What are the signs or symptoms? Symptoms of this condition include:  Skin that is: ? Red. ? Streaking. ? Spotting. ? Swollen. ? Sore or painful when you touch it. ? Warm.  A fever.  Chills.  Blisters. How is this diagnosed? This condition is diagnosed based on:  Medical history.  Physical exam.  Blood tests.  Imaging tests. How is this treated? Treatment for this condition may include:  Medicines to treat infections or allergies.  Home care, such as: ? Rest. ? Placing cold or warm cloths (compresses) on  the skin.  Hospital care, if the condition is very bad. Follow these instructions at home: Medicines  Take over-the-counter and prescription medicines only as told by your doctor.  If you were prescribed an antibiotic medicine, take it as told by your doctor. Do not stop taking it even if you start to feel better. General instructions   Drink enough fluid to keep your pee (urine) pale yellow.  Do not touch or rub the infected area.  Raise (elevate) the infected area above the level of your heart while you are sitting or lying down.  Place cold or warm cloths on the area as told by your doctor.  Keep all follow-up visits as told by your doctor. This is important. Contact a doctor if:  You have a fever.  You do not start to get better after 1-2 days of treatment.  Your bone or joint under the infected area starts to hurt after the skin has healed.  Your infection comes back. This can happen in the same area or another area.  You have a swollen bump in the area.  You have new symptoms.  You feel ill and have muscle aches and pains. Get help right away if:  Your symptoms get worse.  You feel very sleepy.  You throw up (vomit) or have watery poop (diarrhea) for a long time.  You see red streaks coming from the area.  Your red area gets larger.  Your red area turns dark in color. These symptoms may represent a serious problem that  is an emergency. Do not wait to see if the symptoms will go away. Get medical help right away. Call your local emergency services (911 in the [place] Do not drive yourself to the hospital. Summary  Cellulitis is a skin infection. The area is often warm, red, swollen, and sore.  This condition is treated with medicines, rest, and cold and warm cloths.  Take all medicines only as told by your doctor.  Tell your doctor if symptoms do not start to get better after 1-2 days of treatment. This information is not intended to replace advice given  to you by your health care provider. Make sure you discuss any questions you have with your health care provider. Document Revised: 09/24/2017 Document Reviewed: 09/24/2017 Elsevier Patient Education  [place].
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
## 2019-10-07 NOTE — Assessment & Plan Note (Addendum)
Cellulitis of right leg is well controlled and responding to current treatment. No changes or adjustments needed for current medication. Provided education to patient with printed handout on the importance of taking medication as prescribed. Cleocin 150 mg ordered 4 times a day, patient is only taking medication 3 times a day. Pt knows to follow up with unresolved or worsening symptoms

## 2019-10-12 ENCOUNTER — Ambulatory Visit (INDEPENDENT_AMBULATORY_CARE_PROVIDER_SITE_OTHER): Payer: 59 | Admitting: *Deleted

## 2019-10-12 DIAGNOSIS — E1142 Type 2 diabetes mellitus with diabetic polyneuropathy: Secondary | ICD-10-CM | POA: Diagnosis not present

## 2019-10-12 DIAGNOSIS — L03115 Cellulitis of right lower limb: Secondary | ICD-10-CM

## 2019-10-12 NOTE — Patient Instructions (Signed)
Visit Information  Goals Addressed            This Visit's Progress     Patient Stated   . "I want the cellulitis to get better" (pt-stated)       CARE PLAN ENTRY (see longitudinal plan of care for additional care plan information)  Current Barriers:  . Care Coordination needs related to cellulitis in a patient with diabetes (disease states)  Nurse Case Manager Clinical Goal(s):  Marland Kitchen Over the next 30 days, patient will work with Consulting civil engineer to address needs related to cellulitis  Interventions:  . Inter-disciplinary care team collaboration (see longitudinal plan of care) . Chart reviewed including recent office and ED notes and lab results . Talked with patient by telephone . Discussed cellulitis in leg  o Reports that it is much better o Reports that the break in skin on his toe has healed over . Reviewed and discussed medications: He has one day of clindamycin left . S/s of infection reviewed . Advised to reach out to PCP [phone] with any new or worsening symptoms  Patient Self Care Activities:  . Performs ADL's independently . Performs IADL's independently  Initial goal documentation     . COMPLETED: "I want the ulcers on my toes to heal" (pt-stated)       Current Barriers:  . Chronic Disease Management support and education needs related to diabetic foot care  Nurse Case Manager Clinical Goal(s):  Marland Kitchen Over the next 30 days, the patient will demonstrate ongoing self health care management ability as evidenced by continued improvement in foot ulcerations*  Interventions:  . Discussed current condition of feet o Checks feet daily. Uses dressing techniques that he learned from the wound center o Reports that ulcerations are improving and that the newest one is almost healed . Chart Reviewed including recent office notes and labs. Dr Lajuana Ripple assessed feet at most recent visit.  . Encouraged patient to continue checking feet daily and to report any new or  worsening symptoms or if continued improvement is not noted. . Provided education to patient re: diabetic foot care. o S/s of infection reviewed. o Continue to wear shoes and socks when walking o Keep feet dry . Discussed plans with patient for ongoing care management follow up and provided patient with direct contact information for care management team  Patient Self Care Activities:  . Performs ADL's independently . Performs IADL's independently  Please see past updates related to this goal by clicking on the "Past Updates" button in the selected goal      . "I want to keep my blood sugar under control" (pt-stated)       Current Barriers:  . Chronic Disease Management support and education needs related to diabetes  Nurse Case Manager Clinical Goal(s):  Marland Kitchen Over the next 30 days, patient will continue to work with dietician regarding diabetic meal planning . Over the next 90 days, patient will keep all scheduled medical appointments . Over the next 90 days, patient will demonstrate improved diabetes self management as evidenced by blood sugar readings that are mostly within provided recommended range  Interventions:  . Evaluation of current treatment plan related to diabetes and patient's adherence to plan as established by provider. . Chart reviewed including recent office notes and lab results o A1C has improved over the past 6 months o Followed by Dr [name] for diabetes management . Reviewed and discussed medications: glipizide, metformin, ozempic . Discussed home blood sugar readings o Averaging around  145 fasting o Checking once daily in the morning . Reviewed and discussed upcoming appointments o [name], dietician, 11/28/19 o Dr [name] 11/29/19 o Dr Lajuana Ripple 10/21/19 . Encouraged patient to reach out to Dr [name] with any blood sugar readings outside of recommended range . Encouraged patient to keep all medical appointments . Provided with CCM contact information and  encouraged to reach out as needed  Patient Self Care Activities:  . Performs ADL's independently . Performs IADL's independently  Please see past updates related to this goal by clicking on the "Past Updates" button in the selected goal         The patient verbalized understanding of instructions provided today and declined a print copy of patient instruction materials.   Follow-up Plan The care management team will reach out to the patient again over the next 45 days.   [name], BSN, RN-BC Embedded Chronic Care Manager [facility] / [facility] Direct Dial: [phone]

## 2019-10-12 NOTE — Chronic Care Management (AMB) (Signed)
Chronic Care Management   Follow Up Note   10/12/2019 Name: Anthony Logan MRN: [id number] DOB: Nov 26, 1960  Referred by: [name], DO Reason for referral : Chronic Care Management (RN follow up)   Anthony Logan is a 59 y.o. year old male who is a primary care patient of [name], DO. The CCM team was consulted for assistance with chronic disease management and care coordination needs.    Review of patient status, including review of consultants reports, relevant laboratory and other test results, and collaboration with appropriate care team members and the patient's provider was performed as part of comprehensive patient evaluation and provision of chronic care management services.    I talked with Anthony Logan by telephone today regarding management of his chronic medical conditions.   SDOH (Social Determinants of Health) assessments performed: No See Care Plan activities for detailed interventions related to [facility])     Outpatient Encounter Medications as of 10/12/2019  Medication Sig  . Accu-Chek FastClix Lancets MISC Use to test glucose as directed  . allopurinol (ZYLOPRIM) 300 MG tablet Take 1 tablet (300 mg total) by mouth daily.  Marland Kitchen aspirin 81 MG chewable tablet Chew 81 mg by mouth daily.  Marland Kitchen atorvastatin (LIPITOR) 20 MG tablet Take 1 tablet (20 mg total) by mouth daily.  Marland Kitchen buPROPion (WELLBUTRIN XL) 300 MG 24 hr tablet TAKE 1 TABLET DAILY (Patient taking differently: Take 300 mg by mouth daily. )  . cholecalciferol (VITAMIN D) 1000 units tablet Take 1,000 Units by mouth daily.  . clindamycin (CLEOCIN) 150 MG capsule Take 1 capsule (150 mg total) by mouth every 6 (six) hours.  . cyclobenzaprine (FLEXERIL) 10 MG tablet TAKE 1 TABLET AT BEDTIME  . furosemide (LASIX) 20 MG tablet TAKE 2 TABLETS (40 MG)     DAILY (Patient taking differently: Take 40 mg by mouth daily. )  . gabapentin (NEURONTIN) 600 MG tablet Take 1 tablet (600 mg total) by mouth daily with breakfast AND  1.5 tablets (900 mg total) at bedtime.  Marland Kitchen glipiZIDE (GLUCOTROL) 5 MG tablet Take 1 tablet (5 mg total) by mouth 2 (two) times daily before a meal.  . hydrALAZINE (APRESOLINE) 50 MG tablet Take 1 tablet (50 mg total) by mouth 3 (three) times daily.  . hydrochlorothiazide (HYDRODIURIL) 25 MG tablet TAKE 1 TABLET DAILY (Patient taking differently: Take 25 mg by mouth daily. )  . lisinopril (ZESTRIL) 40 MG tablet Take 1 tablet (40 mg total) by mouth daily.  . metFORMIN (GLUCOPHAGE-XR) 500 MG 24 hr tablet TAKE 1 TABLET DAILY WITH   BREAKFAST (Patient taking differently: Take 500 mg by mouth daily with breakfast. )  . metoprolol tartrate (LOPRESSOR) 100 MG tablet TAKE 2 TABLETS (200MG ) TWO TIMES A DAY (Patient taking differently: Take 200 mg by mouth 2 (two) times daily. )  . Multiple Vitamin (MULTIVITAMIN ADULT PO) Take 1 tablet by mouth daily.  . Semaglutide, 1 MG/DOSE, (OZEMPIC, 1 MG/DOSE,) 2 MG/1.5ML SOPN Inject 1 mg into the skin once a week.   No facility-administered encounter medications on file as of 10/12/2019.    RN Care Plan   . "I want the cellulitis to get better" (pt-stated)       CARE PLAN ENTRY (see longitudinal plan of care for additional care plan information)  Current Barriers:  . Care Coordination needs related to cellulitis in a patient with diabetes (disease states)  Nurse Case Manager Clinical Goal(s):  Marland Kitchen Over the next 30 days, patient will work with  RN Care Manager to address needs related to cellulitis  Interventions:  . Inter-disciplinary care team collaboration (see longitudinal plan of care) . Chart reviewed including recent office and ED notes and lab results . Talked with patient by telephone . Discussed cellulitis in leg  o Reports that it is much better o Reports that the break in skin on his toe has healed over . Reviewed and discussed medications: He has one day of clindamycin left . S/s of infection reviewed . Advised to reach out to PCP [phone] with  any new or worsening symptoms  Patient Self Care Activities:  . Performs ADL's independently . Performs IADL's independently  Initial goal documentation     . "I want to keep my blood sugar under control" (pt-stated)       Current Barriers:  . Chronic Disease Management support and education needs related to diabetes  Nurse Case Manager Clinical Goal(s):  Marland Kitchen Over the next 30 days, patient will continue to work with dietician regarding diabetic meal planning . Over the next 90 days, patient will keep all scheduled medical appointments . Over the next 90 days, patient will demonstrate improved diabetes self management as evidenced by blood sugar readings that are mostly within provided recommended range  Interventions:  . Evaluation of current treatment plan related to diabetes and patient's adherence to plan as established by provider. . Chart reviewed including recent office notes and lab results o A1C has improved over the past 6 months o Followed by Dr [name] for diabetes management . Reviewed and discussed medications: glipizide, metformin, ozempic . Discussed home blood sugar readings o Averaging around 145 fasting o Checking once daily in the morning . Reviewed and discussed upcoming appointments o [name], dietician, 11/28/19 o Dr [name] 11/29/19 o Dr Lajuana Ripple 10/21/19 . Encouraged patient to reach out to Dr [name] with any blood sugar readings outside of recommended range . Encouraged patient to keep all medical appointments . Provided with CCM contact information and encouraged to reach out as needed  Patient Self Care Activities:  . Performs ADL's independently . Performs IADL's independently  Please see past updates related to this goal by clicking on the "Past Updates" button in the selected goal          Plan:   The care management team will reach out to the patient again over the next 45 days.   [name], BSN, RN-BC Embedded Chronic Care  Manager [facility] Direct Dial: [phone]

## 2019-10-21 ENCOUNTER — Encounter: Payer: Self-pay | Admitting: Family Medicine

## 2019-10-21 ENCOUNTER — Other Ambulatory Visit: Payer: Self-pay

## 2019-10-21 ENCOUNTER — Ambulatory Visit (INDEPENDENT_AMBULATORY_CARE_PROVIDER_SITE_OTHER): Payer: 59 | Admitting: Family Medicine

## 2019-10-21 VITALS — BP 120/80 | HR 83 | Temp 98.1°F | Ht 77.0 in | Wt 333.0 lb

## 2019-10-21 DIAGNOSIS — E1159 Type 2 diabetes mellitus with other circulatory complications: Secondary | ICD-10-CM

## 2019-10-21 DIAGNOSIS — E1169 Type 2 diabetes mellitus with other specified complication: Secondary | ICD-10-CM | POA: Diagnosis not present

## 2019-10-21 DIAGNOSIS — I10 Essential (primary) hypertension: Secondary | ICD-10-CM

## 2019-10-21 DIAGNOSIS — E785 Hyperlipidemia, unspecified: Secondary | ICD-10-CM

## 2019-10-21 DIAGNOSIS — E11621 Type 2 diabetes mellitus with foot ulcer: Secondary | ICD-10-CM

## 2019-10-21 DIAGNOSIS — L97521 Non-pressure chronic ulcer of other part of left foot limited to breakdown of skin: Secondary | ICD-10-CM

## 2019-10-21 DIAGNOSIS — I700 Atherosclerosis of aorta: Secondary | ICD-10-CM

## 2019-10-21 DIAGNOSIS — I152 Hypertension secondary to endocrine disorders: Secondary | ICD-10-CM
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
## 2019-10-21 NOTE — Patient Instructions (Signed)
  From the ADA website:  Calluses Calluses occur more often and build up faster on the feet of people with diabetes. This is because there are high-pressure areas under the foot. Too much callus may mean that you will need therapeutic shoes and inserts.  Calluses, if not trimmed, get very thick, break down, and turn into ulcers (open sores). Never try to cut calluses or corns yourself--this can lead to ulcers and infection. Let a health care professional on your diabetes care team cut your calluses. Also, do not try to remove calluses and corns with chemical agents. These products can burn your skin. Using a pumice stone every day will help keep calluses under control. It is best to use the pumice stone on wet skin. Put on lotion right after you use the pumice stone.   [email or web address].gl:  Wear proper fitting shoes with ample room in the toebox. Women of course, should avoid severely pointed or high-heeled shoes.  Over-the-counter inserts such as Superfeet may help with calluses on the ball of the foot.  Use a pumice stone (if you don't have neuropathy) regularly after showering or bathing. Apply a moisturizer daily (avoid the area in between the toes).  For stubborn calluses, especially around the heels, consider a lotion that contains agents which chemically break down the callus (called a kertolytic). A common brand is Amlactin and is available over the counter. I often tell patients to apply this at night and cover the feet with stockings. This really helps drive the medicine into the troubled areas and softens the callus so you can gently scrape off the top layer the next day with a pumice stone after bathing.  This often needs to be done several days in a row until the callus is under control.  Beware of "Eggs" and corn cutters. These "cheese grater" or blade-like devices can be risky, especially for people with diabetes. It's easy to slice off a  healthy layer of skin with these types of products.  Avoid so-called corn removers that contain salicylic acid in liquid or the adhesive form. These products can cause problems if used improperly.  Inspect your feet often. Be alert for signs of infection, which can include redness, draining, foul odor, swelling or obvious breaks in the skin.

## 2019-11-01 ENCOUNTER — Ambulatory Visit: Payer: 59 | Admitting: *Deleted

## 2019-11-02 NOTE — Chronic Care Management (AMB) (Signed)
  Chronic Care Management   Follow-up Note 11/01/2019 Name: Anthony Logan MRN: [id number] DOB: 1960/08/20  Referred by: [name], DO Reason for referral : Chronic Care Management (rn follow up)   An unsuccessful telephone follow-up was attempted today. The patient was referred to the case management team for assistance with care management and care coordination.   Follow Up Plan: The care management team will reach out to the patient again over the next 45 days.   [name], BSN, RN-BC Embedded Chronic Care Manager [facility] [place] Management Direct Dial: [phone]

## 2019-11-07 LAB — HM DIABETES EYE EXAM

## 2019-11-09 ENCOUNTER — Other Ambulatory Visit: Payer: Self-pay | Admitting: Family Medicine

## 2019-11-09 DIAGNOSIS — E1142 Type 2 diabetes mellitus with diabetic polyneuropathy: Secondary | ICD-10-CM

## 2019-11-28 ENCOUNTER — Ambulatory Visit: Payer: Medicare HMO | Admitting: Nutrition

## 2019-11-29 ENCOUNTER — Ambulatory Visit (INDEPENDENT_AMBULATORY_CARE_PROVIDER_SITE_OTHER): Payer: 59 | Admitting: "Endocrinology

## 2019-11-29 ENCOUNTER — Other Ambulatory Visit: Payer: Self-pay

## 2019-11-29 ENCOUNTER — Encounter: Payer: Self-pay | Admitting: Family Medicine

## 2019-11-29 ENCOUNTER — Encounter: Payer: Self-pay | Admitting: "Endocrinology

## 2019-11-29 VITALS — BP 135/82 | HR 75 | Ht 77.0 in | Wt 329.6 lb

## 2019-11-29 DIAGNOSIS — I10 Essential (primary) hypertension: Secondary | ICD-10-CM | POA: Diagnosis not present

## 2019-11-29 DIAGNOSIS — E1142 Type 2 diabetes mellitus with diabetic polyneuropathy: Secondary | ICD-10-CM

## 2019-11-29 DIAGNOSIS — E1165 Type 2 diabetes mellitus with hyperglycemia: Secondary | ICD-10-CM

## 2019-11-29 DIAGNOSIS — E782 Mixed hyperlipidemia: Secondary | ICD-10-CM

## 2019-11-29 LAB — POCT GLYCOSYLATED HEMOGLOBIN (HGB A1C): Hemoglobin A1C: 6.4 % — AB (ref 4.0–5.6)

## 2019-11-29 MED ORDER — GABAPENTIN 600 MG PO TABS: ORAL_TABLET | ORAL | 1 refills | Status: DC

## 2019-11-29 NOTE — Patient Instructions (Signed)
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
## 2019-12-07 ENCOUNTER — Ambulatory Visit (INDEPENDENT_AMBULATORY_CARE_PROVIDER_SITE_OTHER): Payer: 59 | Admitting: *Deleted

## 2019-12-07 DIAGNOSIS — Z0000 Encounter for general adult medical examination without abnormal findings: Secondary | ICD-10-CM | POA: Diagnosis not present

## 2019-12-07 NOTE — Patient Instructions (Signed)
Preventive Care 41-59 Years Old, Male Preventive care refers to lifestyle choices and visits with your health care provider that can promote health and wellness. This includes:  A yearly physical exam. This is also called an annual well check.  Regular dental and eye exams.  Immunizations.  Screening for certain conditions.  Healthy lifestyle choices, such as eating a healthy diet, getting regular exercise, not using drugs or products that contain nicotine and tobacco, and limiting alcohol use. What can I expect for my preventive care visit? Physical exam Your health care provider will check:  Height and weight. These may be used to calculate body mass index (BMI), which is a measurement that tells if you are at a healthy weight.  Heart rate and blood pressure.  Your skin for abnormal spots. Counseling Your health care provider may ask you questions about:  Alcohol, tobacco, and drug use.  Emotional well-being.  Home and relationship well-being.  Sexual activity.  Eating habits.  Work and work Statistician. What immunizations do I need?  Influenza (flu) vaccine  This is recommended every year. Tetanus, diphtheria, and pertussis (Tdap) vaccine  You may need a Td booster every 10 years. Varicella (chickenpox) vaccine  You may need this vaccine if you have not already been vaccinated. Zoster (shingles) vaccine  You may need this after age 64. Measles, mumps, and rubella (MMR) vaccine  You may need at least one dose of MMR if you were born in 1957 or later. You may also need a second dose. Pneumococcal conjugate (PCV13) vaccine  You may need this if you have certain conditions and were not previously vaccinated. Pneumococcal polysaccharide (PPSV23) vaccine  You may need one or two doses if you smoke cigarettes or if you have certain conditions. Meningococcal conjugate (MenACWY) vaccine  You may need this if you have certain conditions. Hepatitis A  vaccine  You may need this if you have certain conditions or if you travel or work in places where you may be exposed to hepatitis A. Hepatitis B vaccine  You may need this if you have certain conditions or if you travel or work in places where you may be exposed to hepatitis B. Haemophilus influenzae type b (Hib) vaccine  You may need this if you have certain risk factors. Human papillomavirus (HPV) vaccine  If recommended by your health care provider, you may need three doses over 6 months. You may receive vaccines as individual doses or as more than one vaccine together in one shot (combination vaccines). Talk with your health care provider about the risks and benefits of combination vaccines. What tests do I need? Blood tests  Lipid and cholesterol levels. These may be checked every 5 years, or more frequently if you are over 59 years old.  Hepatitis C test.  Hepatitis B test. Screening  Lung cancer screening. You may have this screening every year starting at age 59 if you have a 30-pack-year history of smoking and currently smoke or have quit within the past 15 years.  Prostate cancer screening. Recommendations will vary depending on your family history and other risks.  Colorectal cancer screening. All adults should have this screening starting at age 59 and continuing until age 59. Your health care provider may recommend screening at age 59 if you are at increased risk. You will have tests every 1-10 years, depending on your results and the type of screening test.  Diabetes screening. This is done by checking your blood sugar (glucose) after you have not eaten  for a while (fasting). You may have this done every 1-3 years.  Sexually transmitted disease (STD) testing. Follow these instructions at home: Eating and drinking  Eat a diet that includes fresh fruits and vegetables, whole grains, lean protein, and low-fat dairy products.  Take vitamin and mineral supplements as  recommended by your health care provider.  Do not drink alcohol if your health care provider tells you not to drink.  If you drink alcohol: ? Limit how much you have to 0-2 drinks a day. ? Be aware of how much alcohol is in your drink. In the [place] one drink equals one 12 oz bottle of beer (355 mL), one 5 oz glass of wine (148 mL), or one 1 oz glass of hard liquor (44 mL). Lifestyle  Take daily care of your teeth and gums.  Stay active. Exercise for at least 30 minutes on 5 or more days each week.  Do not use any products that contain nicotine or tobacco, such as cigarettes, e-cigarettes, and chewing tobacco. If you need help quitting, ask your health care provider.  If you are sexually active, practice safe sex. Use a condom or other form of protection to prevent STIs (sexually transmitted infections).  Talk with your health care provider about taking a low-dose aspirin every day starting at age 59. What's next?  Go to your health care provider once a year for a well check visit.  Ask your health care provider how often you should have your eyes and teeth checked.  Stay up to date on all vaccines. This information is not intended to replace advice given to you by your health care provider. Make sure you discuss any questions you have with your health care provider. Document Revised: 04/29/2018 Document Reviewed: 04/29/2018 Elsevier Patient Education  2020 [employer].
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
## 2019-12-08 ENCOUNTER — Ambulatory Visit (INDEPENDENT_AMBULATORY_CARE_PROVIDER_SITE_OTHER): Payer: 59 | Admitting: Cardiology

## 2019-12-08 ENCOUNTER — Encounter: Payer: Self-pay | Admitting: Cardiology

## 2019-12-08 VITALS — BP 148/84 | HR 78 | Ht 77.0 in | Wt 330.0 lb

## 2019-12-08 DIAGNOSIS — I351 Nonrheumatic aortic (valve) insufficiency: Secondary | ICD-10-CM

## 2019-12-08 DIAGNOSIS — I10 Essential (primary) hypertension: Secondary | ICD-10-CM

## 2019-12-08 DIAGNOSIS — I77810 Thoracic aortic ectasia: Secondary | ICD-10-CM | POA: Diagnosis not present

## 2019-12-08 NOTE — Patient Instructions (Addendum)
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
## 2019-12-12 ENCOUNTER — Other Ambulatory Visit: Payer: Self-pay | Admitting: Family Medicine

## 2019-12-12 DIAGNOSIS — E1159 Type 2 diabetes mellitus with other circulatory complications: Secondary | ICD-10-CM

## 2019-12-12 DIAGNOSIS — F32A Depression, unspecified: Secondary | ICD-10-CM

## 2019-12-12 DIAGNOSIS — I152 Hypertension secondary to endocrine disorders: Secondary | ICD-10-CM

## 2019-12-28 ENCOUNTER — Other Ambulatory Visit: Payer: Self-pay | Admitting: "Endocrinology

## 2019-12-28 ENCOUNTER — Other Ambulatory Visit: Payer: Self-pay | Admitting: Family Medicine

## 2020-01-04 ENCOUNTER — Ambulatory Visit (INDEPENDENT_AMBULATORY_CARE_PROVIDER_SITE_OTHER): Payer: 59 | Admitting: *Deleted

## 2020-01-04 DIAGNOSIS — E1159 Type 2 diabetes mellitus with other circulatory complications: Secondary | ICD-10-CM

## 2020-01-04 DIAGNOSIS — I10 Essential (primary) hypertension: Secondary | ICD-10-CM | POA: Diagnosis not present

## 2020-01-04 DIAGNOSIS — E1142 Type 2 diabetes mellitus with diabetic polyneuropathy: Secondary | ICD-10-CM | POA: Diagnosis not present

## 2020-01-04 DIAGNOSIS — I152 Hypertension secondary to endocrine disorders: Secondary | ICD-10-CM

## 2020-01-04 NOTE — Chronic Care Management (AMB) (Signed)
Chronic Care Management   Follow Up Note   01/04/2020 Name: Anthony Logan MRN: [id number] DOB: February 04, 1961  Referred by: [name], DO Reason for referral : Chronic Care Management (RN follow up)   Anthony Logan is a 59 y.o. year old male who is a primary care patient of [name], DO. The CCM team was consulted for assistance with chronic disease management and care coordination needs.    Review of patient status, including review of consultants reports, relevant laboratory and other test results, and collaboration with appropriate care team members and the patient's provider was performed as part of comprehensive patient evaluation and provision of chronic care management services.    SDOH (Social Determinants of Health) assessments performed: No See Care Plan activities for detailed interventions related to [facility])     Outpatient Encounter Medications as of 01/04/2020  Medication Sig  . Accu-Chek FastClix Lancets MISC Use to test glucose as directed  . allopurinol (ZYLOPRIM) 300 MG tablet Take 1 tablet (300 mg total) by mouth daily.  Marland Kitchen aspirin 81 MG chewable tablet Chew 81 mg by mouth daily.  Marland Kitchen atorvastatin (LIPITOR) 20 MG tablet TAKE 1 TABLET DAILY  . buPROPion (WELLBUTRIN XL) 300 MG 24 hr tablet TAKE 1 TABLET DAILY  . cholecalciferol (VITAMIN D) 1000 units tablet Take 1,000 Units by mouth daily.  . cyclobenzaprine (FLEXERIL) 10 MG tablet TAKE 1 TABLET AT BEDTIME  . furosemide (LASIX) 20 MG tablet TAKE 2 TABLETS (40 MG)     DAILY (Patient taking differently: Take 40 mg by mouth daily. )  . gabapentin (NEURONTIN) 600 MG tablet Take 1 tablet at lunch and 1 1/2 tablets at bedtime  . glipiZIDE (GLUCOTROL) 5 MG tablet TAKE 1 TABLET TWICE A DAY  BEFORE MEALS  . hydrALAZINE (APRESOLINE) 50 MG tablet Take 1 tablet (50 mg total) by mouth 3 (three) times daily.  . hydrochlorothiazide (HYDRODIURIL) 25 MG tablet TAKE 1 TABLET DAILY  . lisinopril (ZESTRIL) 40 MG tablet Take 1  tablet (40 mg total) by mouth daily.  . melatonin 5 MG TABS Take 15 mg by mouth at bedtime.  . metFORMIN (GLUCOPHAGE-XR) 500 MG 24 hr tablet TAKE 1 TABLET DAILY WITH   BREAKFAST (Patient taking differently: Take 500 mg by mouth daily with breakfast. )  . metoprolol tartrate (LOPRESSOR) 100 MG tablet TAKE 2 TABLETS TWICE A DAY  . Multiple Vitamin (MULTIVITAMIN ADULT PO) Take 1 tablet by mouth daily.  Marland Kitchen OZEMPIC, 1 MG/DOSE, 4 MG/3ML SOPN INJECT 1MG  SUBCUTANEOUSLY  ONCE A WEEK   No facility-administered encounter medications on file as of 01/04/2020.    Lab Results  Component Value Date   HGBA1C 6.4 (A) 11/29/2019   HGBA1C 7.6 (A) 07/27/2019   HGBA1C 7.3 (H) 06/24/2019   Lab Results  Component Value Date   LDLCALC 23 06/24/2019   CREATININE 1.02 08/29/2019   BP Readings from Last 3 Encounters:  12/08/19 (!) 148/84  11/29/19 135/82  10/21/19 120/80     RN Care Plan             This Visit's Progress     Patient Stated   .  COMPLETED: "I want the cellulitis to get better" (pt-stated)        CARE PLAN ENTRY (see longitudinal plan of care for additional care plan information)  Current Barriers:  . Care Coordination needs related to cellulitis in a patient with diabetes (disease states)  Nurse Case Manager Clinical Goal(s):  Marland Kitchen Over the next  30 days, patient will work with Consulting civil engineer to address needs related to cellulitis  Interventions:  . Inter-disciplinary care team collaboration (see longitudinal plan of care) . Chart reviewed including recent office and ED notes and lab results . Talked with patient by telephone . Discussed cellulitis in leg  o Reports that it is much better o Reports that the break in skin on his toe has healed over . Reviewed and discussed medications: He has one day of clindamycin left . S/s of infection reviewed . Advised to reach out to PCP [phone] with any new or worsening symptoms  Patient Self Care Activities:  . Performs ADL's  independently . Performs IADL's independently  Initial goal documentation     .  "I want to keep my blood sugar under control" (pt-stated)        Current Barriers:  . Chronic Disease Management support and education needs related to diabetes in patient with hypertension  Nurse Case Manager Clinical Goal(s):  Marland Kitchen Over the next 180 days, patient will work with endocrinologist regarding diabetes management . Over the next 90 days, patient will keep all scheduled medical appointments . Over the next 90 days, patient will talk with RN Care Manager regarding diabetes management  Interventions:  . Evaluation of current treatment plan related to diabetes and patient's adherence to plan as established by provider. . Chart reviewed including recent office notes and lab results o Discussed that he he hasn't had the labwork done that Dr [name] ordered at his visit in July o Advised that he should be able to have this done at [facility] on [address] in [place] . Reviewed medications . Discussed home blood sugar readings . Reviewed and discussed upcoming appointments o Dr [name] 04/02/20 o Dr Lajuana Ripple 04/23/20 . Encouraged patient to reach out to Dr [name] with any blood sugar readings outside of recommended range . Encouraged patient to keep all medical appointments . Provided with CCM contact information and encouraged to reach out as needed  Patient Self Care Activities:  . Performs ADL's independently . Performs IADL's independently  Please see past updates related to this goal by clicking on the "Past Updates" button in the selected goal          Plan:   The care management team will reach out to the patient again over the next 60 days.    [name], BSN, RN-BC Embedded Chronic Care Manager [facility] / [place] [facility] Direct Dial: [phone]

## 2020-01-04 NOTE — Patient Instructions (Signed)
Visit Information  Goals Addressed              This Visit's Progress     Patient Stated   .  COMPLETED: "I want the cellulitis to get better" (pt-stated)        CARE PLAN ENTRY (see longitudinal plan of care for additional care plan information)  Current Barriers:  . Care Coordination needs related to cellulitis in a patient with diabetes (disease states)  Nurse Case Manager Clinical Goal(s):  Marland Kitchen Over the next 30 days, patient will work with Consulting civil engineer to address needs related to cellulitis  Interventions:  . Inter-disciplinary care team collaboration (see longitudinal plan of care) . Chart reviewed including recent office and ED notes and lab results . Talked with patient by telephone . Discussed cellulitis in leg  o Reports that it is much better o Reports that the break in skin on his toe has healed over . Reviewed and discussed medications: He has one day of clindamycin left . S/s of infection reviewed . Advised to reach out to PCP [phone] with any new or worsening symptoms  Patient Self Care Activities:  . Performs ADL's independently . Performs IADL's independently  Initial goal documentation     .  "I want to keep my blood sugar under control" (pt-stated)        Current Barriers:  . Chronic Disease Management support and education needs related to diabetes  in a patient with hypertension  Nurse Case Manager Clinical Goal(s):  Marland Kitchen Over the next 180 days, patient will work with endocrinologist regarding diabetes management . Over the next 90 days, patient will keep all scheduled medical appointments . Over the next 90 days, patient will talk with RN Care Manager regarding diabetes management  Interventions:  . Evaluation of current treatment plan related to diabetes and patient's adherence to plan as established by provider. . Chart reviewed including recent office notes and lab results o Discussed that he he hasn't had the labwork done that Dr [name]  ordered at his visit in July o Advised that he should be able to have this done at [place] on [address] in [place] . Reviewed medications . Discussed home blood sugar readings . Reviewed and discussed upcoming appointments o Dr [name] 04/02/20 o Dr Lajuana Ripple 04/23/20 . Encouraged patient to reach out to Dr [name] with any blood sugar readings outside of recommended range . Encouraged patient to keep all medical appointments . Provided with CCM contact information and encouraged to reach out as needed  Patient Self Care Activities:  . Performs ADL's independently . Performs IADL's independently  Please see past updates related to this goal by clicking on the "Past Updates" button in the selected goal         Patient verbalizes understanding of instructions provided today.   Follow-up Plan The care management team will reach out to the patient again over the next 60 days.   [name], BSN, RN-BC Embedded Chronic Care Manager [facility] / [place] Management Direct Dial: [phone]

## 2020-01-06 DIAGNOSIS — E1165 Type 2 diabetes mellitus with hyperglycemia: Secondary | ICD-10-CM | POA: Diagnosis not present

## 2020-01-07 LAB — COMPLETE METABOLIC PANEL WITH GFR
AG Ratio: 1.8 (calc) (ref 1.0–2.5)
ALT: 29 U/L (ref 9–46)
AST: 26 U/L (ref 10–35)
Albumin: 4.3 g/dL (ref 3.6–5.1)
Alkaline phosphatase (APISO): 55 U/L (ref 35–144)
BUN/Creatinine Ratio: 20 (calc) (ref 6–22)
BUN: 27 mg/dL — ABNORMAL HIGH (ref 7–25)
CO2: 33 mmol/L — ABNORMAL HIGH (ref 20–32)
Calcium: 9.1 mg/dL (ref 8.6–10.3)
Chloride: 96 mmol/L — ABNORMAL LOW (ref 98–110)
Creat: 1.32 mg/dL (ref 0.70–1.33)
GFR, Est African American: 68 mL/min/{1.73_m2} (ref >=60)
GFR, Est Non African American: 59 mL/min/{1.73_m2} — ABNORMAL LOW (ref >=60)
Globulin: 2.4 g/dL (calc) (ref 1.9–3.7)
Glucose, Bld: 119 mg/dL (ref 65–139)
Potassium: 3.7 mmol/L (ref 3.5–5.3)
Sodium: 139 mmol/L (ref 135–146)
Total Bilirubin: 0.9 mg/dL (ref 0.2–1.2)
Total Protein: 6.7 g/dL (ref 6.1–8.1)

## 2020-01-07 LAB — T4, FREE: Free T4: 1.2 ng/dL (ref 0.8–1.8)

## 2020-01-07 LAB — TSH: TSH: 0.72 mIU/L (ref 0.40–4.50)

## 2020-01-24 ENCOUNTER — Encounter: Payer: Self-pay | Admitting: Family Medicine

## 2020-01-24 ENCOUNTER — Other Ambulatory Visit: Payer: Self-pay

## 2020-01-24 ENCOUNTER — Ambulatory Visit (INDEPENDENT_AMBULATORY_CARE_PROVIDER_SITE_OTHER): Payer: 59 | Admitting: Family Medicine

## 2020-01-24 VITALS — BP 137/85 | HR 86 | Temp 96.5°F | Ht 77.0 in | Wt 326.0 lb

## 2020-01-24 DIAGNOSIS — N3001 Acute cystitis with hematuria: Secondary | ICD-10-CM

## 2020-01-24 DIAGNOSIS — R319 Hematuria, unspecified: Secondary | ICD-10-CM

## 2020-01-24 LAB — MICROSCOPIC EXAMINATION: Bacteria, UA: NONE SEEN

## 2020-01-24 LAB — URINALYSIS, COMPLETE
Bilirubin, UA: NEGATIVE
Glucose, UA: NEGATIVE
Ketones, UA: NEGATIVE
Leukocytes,UA: NEGATIVE
Nitrite, UA: POSITIVE — AB
Specific Gravity, UA: 1.02 (ref 1.005–1.030)
Urobilinogen, Ur: 1 mg/dL (ref 0.2–1.0)
pH, UA: 7 (ref 5.0–7.5)

## 2020-01-24 MED ORDER — CIPROFLOXACIN HCL 500 MG PO TABS: 500.0000 mg | ORAL_TABLET | Freq: Two times a day (BID) | ORAL | 0 refills | Status: AC

## 2020-01-24 NOTE — Progress Notes (Signed)
Assessment & Plan:  1. Acute cystitis with hematuria - Education provided on UTIs. Encouraged adequate hydration. Advised patient to return in 2-3 weeks to re-check urine so that we can make sure hematuria has resolved (order already placed) - ciprofloxacin (CIPRO) 500 MG tablet; Take 1 tablet (500 mg total) by mouth 2 (two) times daily for 10 days.  Dispense: 20 tablet; Refill: 0 - Urine Culture - Urinalysis, Complete; Future  2. Hematuria, unspecified type - Urinalysis, Complete - Urine dipstick shows positive for RBC's, positive for protein and positive for nitrates.  Micro exam: 0-5 WBC's per HPF and 3-10 RBC's per HPF.   Follow up plan: Return if symptoms worsen or fail to improve.  Hendricks Limes, MSN, APRN, FNP-C [facility]  Subjective:   Patient ID: Anthony Logan, male    DOB: 06-24-1960, 59 y.o.   MRN: [id number]  HPI: Anthony Logan is a 59 y.o. male presenting on 01/24/2020 for Hematuria (x 1 day.  Patient states he took [place] yesterday.)  Urinary Tract Infection: Patient complains of dysuria, hematuria, urgency and urinating small amounts He has had symptoms for 1 day. Patient denies back pain and fever. Patient does have a history of recurrent UTI.  Patient does not have a history of pyelonephritis.    ROS: Negative unless specifically indicated above in HPI.   Relevant past medical history reviewed and updated as indicated.   Allergies and medications reviewed and updated.   Current Outpatient Medications:    Accu-Chek FastClix Lancets MISC, Use to test glucose as directed, Disp: 100 each, Rfl: 2   allopurinol (ZYLOPRIM) 300 MG tablet, Take 1 tablet (300 mg total) by mouth daily., Disp: 90 tablet, Rfl: 3   aspirin 81 MG chewable tablet, Chew 81 mg by mouth daily., Disp: , Rfl:    atorvastatin (LIPITOR) 20 MG tablet, TAKE 1 TABLET DAILY, Disp: 90 tablet, Rfl: 0   buPROPion (WELLBUTRIN XL) 300 MG 24 hr tablet, TAKE 1 TABLET DAILY, Disp:  90 tablet, Rfl: 0   cholecalciferol (VITAMIN D) 1000 units tablet, Take 1,000 Units by mouth daily., Disp: , Rfl:    cyclobenzaprine (FLEXERIL) 10 MG tablet, TAKE 1 TABLET AT BEDTIME, Disp: 90 tablet, Rfl: 3   furosemide (LASIX) 20 MG tablet, TAKE 2 TABLETS (40 MG)     DAILY (Patient taking differently: Take 40 mg by mouth daily. ), Disp: 180 tablet, Rfl: 2   gabapentin (NEURONTIN) 600 MG tablet, Take 1 tablet at lunch and 1 1/2 tablets at bedtime, Disp: 135 tablet, Rfl: 1   glipiZIDE (GLUCOTROL) 5 MG tablet, TAKE 1 TABLET TWICE A DAY  BEFORE MEALS, Disp: 180 tablet, Rfl: 0   hydrochlorothiazide (HYDRODIURIL) 25 MG tablet, TAKE 1 TABLET DAILY, Disp: 90 tablet, Rfl: 1   lisinopril (ZESTRIL) 40 MG tablet, Take 1 tablet (40 mg total) by mouth daily., Disp: 90 tablet, Rfl: 3   melatonin 5 MG TABS, Take 15 mg by mouth at bedtime., Disp: , Rfl:    metFORMIN (GLUCOPHAGE-XR) 500 MG 24 hr tablet, TAKE 1 TABLET DAILY WITH   BREAKFAST (Patient taking differently: Take 500 mg by mouth daily with breakfast. ), Disp: 90 tablet, Rfl: 0   metoprolol tartrate (LOPRESSOR) 100 MG tablet, TAKE 2 TABLETS TWICE A DAY, Disp: 360 tablet, Rfl: 1   Multiple Vitamin (MULTIVITAMIN ADULT PO), Take 1 tablet by mouth daily., Disp: , Rfl:    OZEMPIC, 1 MG/DOSE, 4 MG/3ML SOPN, INJECT 1MG  SUBCUTANEOUSLY  ONCE A WEEK, Disp:  6 mL, Rfl: 0   hydrALAZINE (APRESOLINE) 50 MG tablet, Take 1 tablet (50 mg total) by mouth 3 (three) times daily., Disp: 270 tablet, Rfl: 3  Allergies  Allergen Reactions   Diltiazem Swelling    Wt gain,swelling hands,feet,gum bleeding   Clonidine Derivatives Swelling   Procardia [Nifedipine] Swelling    Swelling on feet and legs     Objective:   BP 137/85    Pulse 86    Temp (!) 96.5 F (35.8 C) (Temporal)    Ht 6\' 5"  (1.956 m)    Wt (!) 326 lb (147.9 kg)    SpO2 95%    BMI 38.66 kg/m    Physical Exam Vitals reviewed.  Constitutional:      General: He is not in acute distress.     Appearance: Normal appearance. He is obese. He is not ill-appearing, toxic-appearing or diaphoretic.  HENT:     Head: Normocephalic and atraumatic.  Eyes:     General: No scleral icterus.       Right eye: No discharge.        Left eye: No discharge.     Conjunctiva/sclera: Conjunctivae normal.  Cardiovascular:     Rate and Rhythm: Normal rate.  Pulmonary:     Effort: Pulmonary effort is normal. No respiratory distress.  Musculoskeletal:        General: Normal range of motion.     Cervical back: Normal range of motion.  Skin:    General: Skin is warm and dry.  Neurological:     Mental Status: He is alert and oriented to person, place, and time. Mental status is at baseline.  Psychiatric:        Mood and Affect: Mood normal.        Behavior: Behavior normal.        Thought Content: Thought content normal.        Judgment: Judgment normal.

## 2020-01-24 NOTE — Patient Instructions (Addendum)
Return in 2-3 weeks to have urine re-checked to make sure there is no more blood.   Urinary Tract Infection, Adult A urinary tract infection (UTI) is an infection of any part of the urinary tract. The urinary tract includes:  The kidneys.  The ureters.  The bladder.  The urethra. These organs make, store, and get rid of pee (urine) in the body. What are the causes? This is caused by germs (bacteria) in your genital area. These germs grow and cause swelling (inflammation) of your urinary tract. What increases the risk? You are more likely to develop this condition if:  You have a small, thin tube (catheter) to drain pee.  You cannot control when you pee or poop (incontinence).  You are male, and: ? You use these methods to prevent pregnancy:  A medicine that kills sperm (spermicide).  A device that blocks sperm (diaphragm). ? You have low levels of a male hormone (estrogen). ? You are pregnant.  You have genes that add to your risk.  You are sexually active.  You take antibiotic medicines.  You have trouble peeing because of: ? A prostate that is bigger than normal, if you are male. ? A blockage in the part of your body that drains pee from the bladder (urethra). ? A kidney stone. ? A nerve condition that affects your bladder (neurogenic bladder). ? Not getting enough to drink. ? Not peeing often enough.  You have other conditions, such as: ? Diabetes. ? A weak disease-fighting system (immune system). ? Sickle cell disease. ? Gout. ? Injury of the spine. What are the signs or symptoms? Symptoms of this condition include:  Needing to pee right away (urgently).  Peeing often.  Peeing small amounts often.  Pain or burning when peeing.  Blood in the pee.  Pee that smells bad or not like normal.  Trouble peeing.  Pee that is cloudy.  Fluid coming from the vagina, if you are male.  Pain in the belly or lower back. Other symptoms  include:  Throwing up (vomiting).  No urge to eat.  Feeling mixed up (confused).  Being tired and grouchy (irritable).  A fever.  Watery poop (diarrhea). How is this treated? This condition may be treated with:  Antibiotic medicine.  Other medicines.  Drinking enough water. Follow these instructions at home:  Medicines  Take over-the-counter and prescription medicines only as told by your doctor.  If you were prescribed an antibiotic medicine, take it as told by your doctor. Do not stop taking it even if you start to feel better. General instructions  Make sure you: ? Pee until your bladder is empty. ? Do not hold pee for a long time. ? Empty your bladder after sex. ? Wipe from front to back after pooping if you are a male. Use each tissue one time when you wipe.  Drink enough fluid to keep your pee pale yellow.  Keep all follow-up visits as told by your doctor. This is important. Contact a doctor if:  You do not get better after 1-2 days.  Your symptoms go away and then come back. Get help right away if:  You have very bad back pain.  You have very bad pain in your lower belly.  You have a fever.  You are sick to your stomach (nauseous).  You are throwing up. Summary  A urinary tract infection (UTI) is an infection of any part of the urinary tract.  This condition is caused by germs in  your genital area.  There are many risk factors for a UTI. These include having a small, thin tube to drain pee and not being able to control when you pee or poop.  Treatment includes antibiotic medicines for germs.  Drink enough fluid to keep your pee pale yellow. This information is not intended to replace advice given to you by your health care provider. Make sure you discuss any questions you have with your health care provider. Document Revised: 04/22/2018 Document Reviewed: 11/12/2017 Elsevier Patient Education  2020 [employer].

## 2020-01-26 LAB — URINE CULTURE

## 2020-02-07 ENCOUNTER — Other Ambulatory Visit: Payer: Self-pay | Admitting: Family Medicine

## 2020-02-07 DIAGNOSIS — E1142 Type 2 diabetes mellitus with diabetic polyneuropathy: Secondary | ICD-10-CM

## 2020-02-16 NOTE — Chronic Care Management (AMB) (Signed)
  Chronic Care Management   Outreach Note  02/17/2019 Name: [name] MRN: [id number] DOB: 1961-03-20  Referred by: [name], DO Reason for referral : Chronic Care Management   An unsuccessful telephone outreach was attempted today. The patient was referred to the case management team for assistance with care management and care coordination.   Follow Up Plan: A HIPAA compliant phone message was left for the patient providing contact information and requesting a return call.  The care management team will reach out to the patient again over the next 45 days.   [name], BSN, RN-BC Embedded Chronic Care Manager [facility] / [place] [facility] Direct Dial: [phone]

## 2020-02-24 ENCOUNTER — Other Ambulatory Visit: Payer: Self-pay | Admitting: "Endocrinology

## 2020-02-29 ENCOUNTER — Ambulatory Visit: Payer: Medicare HMO | Admitting: Urology

## 2020-03-06 ENCOUNTER — Telehealth: Payer: 59

## 2020-03-11 ENCOUNTER — Other Ambulatory Visit: Payer: Self-pay | Admitting: Family Medicine

## 2020-03-11 DIAGNOSIS — F32A Depression, unspecified: Secondary | ICD-10-CM

## 2020-03-16 ENCOUNTER — Other Ambulatory Visit: Payer: 59

## 2020-03-16 ENCOUNTER — Other Ambulatory Visit: Payer: Self-pay

## 2020-03-16 DIAGNOSIS — C641 Malignant neoplasm of right kidney, except renal pelvis: Secondary | ICD-10-CM | POA: Diagnosis not present

## 2020-03-19 ENCOUNTER — Ambulatory Visit (HOSPITAL_COMMUNITY): Payer: 59

## 2020-03-20 ENCOUNTER — Ambulatory Visit: Payer: 59 | Admitting: *Deleted

## 2020-03-20 DIAGNOSIS — I152 Hypertension secondary to endocrine disorders: Secondary | ICD-10-CM

## 2020-03-20 DIAGNOSIS — E1142 Type 2 diabetes mellitus with diabetic polyneuropathy: Secondary | ICD-10-CM

## 2020-03-20 DIAGNOSIS — E1159 Type 2 diabetes mellitus with other circulatory complications: Secondary | ICD-10-CM

## 2020-03-20 NOTE — Chronic Care Management (AMB) (Signed)
Chronic Care Management   Follow Up Note   03/20/2020 Name: Anthony Logan MRN: [id number] DOB: 04-Jan-1961  Referred by: Anthony Norlander, DO Reason for referral : Chronic Care Management (RN follow up)   Anthony Logan is a 59 y.o. year old male who is a primary care patient of Anthony Norlander, DO. The CCM team was consulted for assistance with chronic disease management and care coordination needs.    Review of patient status, including review of consultants reports, relevant laboratory and other test results, and collaboration with appropriate care team members and the patient's provider was performed as part of comprehensive patient evaluation and provision of chronic care management services.    SDOH (Social Determinants of Health) assessments performed: No See Care Plan activities for detailed interventions related to [facility])     Outpatient Encounter Medications as of 03/20/2020  Medication Sig  . Accu-Chek FastClix Lancets MISC Use to test glucose as directed  . allopurinol (ZYLOPRIM) 300 MG tablet Take 1 tablet (300 mg total) by mouth daily.  Marland Kitchen aspirin 81 MG chewable tablet Chew 81 mg by mouth daily.  Marland Kitchen atorvastatin (LIPITOR) 20 MG tablet TAKE 1 TABLET DAILY  . buPROPion (WELLBUTRIN XL) 300 MG 24 hr tablet TAKE 1 TABLET DAILY  . cholecalciferol (VITAMIN D) 1000 units tablet Take 1,000 Units by mouth daily.  . cyclobenzaprine (FLEXERIL) 10 MG tablet TAKE 1 TABLET AT BEDTIME  . furosemide (LASIX) 20 MG tablet TAKE 2 TABLETS (40 MG)     DAILY  . gabapentin (NEURONTIN) 600 MG tablet TAKE 1 TABLET AT BEDTIME  . glipiZIDE (GLUCOTROL) 5 MG tablet TAKE 1 TABLET TWICE A DAY  BEFORE MEALS  . hydrALAZINE (APRESOLINE) 50 MG tablet Take 1 tablet (50 mg total) by mouth 3 (three) times daily.  . hydrochlorothiazide (HYDRODIURIL) 25 MG tablet TAKE 1 TABLET DAILY  . lisinopril (ZESTRIL) 40 MG tablet Take 1 tablet (40 mg total) by mouth daily.  . melatonin 5 MG TABS Take 15 mg by mouth at  bedtime.  . metFORMIN (GLUCOPHAGE-XR) 500 MG 24 hr tablet TAKE 1 TABLET DAILY WITH   BREAKFAST (Patient taking differently: Take 500 mg by mouth daily with breakfast. )  . metoprolol tartrate (LOPRESSOR) 100 MG tablet TAKE 2 TABLETS TWICE A DAY  . Multiple Vitamin (MULTIVITAMIN ADULT PO) Take 1 tablet by mouth daily.  Marland Kitchen OZEMPIC, 1 MG/DOSE, 4 MG/3ML SOPN INJECT 1MG  SUBCUTANEOUSLY  ONCE A WEEK   No facility-administered encounter medications on file as of 03/20/2020.     Lab Results  Component Value Date   HGBA1C 6.4 (A) 11/29/2019   HGBA1C 7.6 (A) 07/27/2019   HGBA1C 7.3 (H) 06/24/2019   Lab Results  Component Value Date   LDLCALC 23 06/24/2019   CREATININE 1.32 01/06/2020   BP Readings from Last 3 Encounters:  01/24/20 137/85  12/08/19 (!) 148/84  11/29/19 135/82     Goals Addressed              This Visit's Progress     Patient Stated   .  COMPLETED: "I want to keep my blood sugar under control" (pt-stated)        Current Barriers:  . Chronic Disease Management support and education needs related to diabetes  in a patient with hypertension  Nurse Case Manager Clinical Goal(s):  Marland Kitchen Over the next 180 days, patient will work with endocrinologist regarding diabetes management . Over the next 90 days, patient will keep all scheduled medical appointments .  Over the next 90 days, patient will talk with RN Care Manager regarding diabetes management  Interventions:  . Evaluation of current treatment plan related to diabetes and patient's adherence to plan as established by provider. . Chart reviewed including recent office notes and lab results . Reviewed medications . Discussed home blood sugar readings . Reviewed upcoming appointments . Encouraged patient to reach out to Dr [name] with any blood sugar readings outside of recommended range . Encouraged patient to keep all medical appointments . Provided with CCM contact information and encouraged to reach out as  needed  Patient Self Care Activities:  . Performs ADL's independently . Performs IADL's independently  Please see past updates related to this goal by clicking on the "Past Updates" button in the selected goal   Goal Complete: Patient is working with Dr [name] and his blood sugar is well controlled        Plan:   The patient has been provided with contact information for the care management team and has been advised to call with any health related questions or concerns.  CCM enrollment status changed to "previously enrolled". Case closed to case management services in primary care home.  Follow up with endocrinology, cardiology, and PCP as planned   Anthony Logan, BSN, RN-BC [place] [facility] [place] [facility] Direct Dial: [phone]

## 2020-03-20 NOTE — Patient Instructions (Signed)
Visit Information  Goals Addressed              This Visit's Progress     Patient Stated   .  COMPLETED: "I want to keep my blood sugar under control" (pt-stated)        Current Barriers:  . Chronic Disease Management support and education needs related to diabetes  in a patient with hypertension  Nurse Case Manager Clinical Goal(s):  Marland Kitchen Over the next 180 days, patient will work with endocrinologist regarding diabetes management . Over the next 90 days, patient will keep all scheduled medical appointments . Over the next 90 days, patient will talk with RN Care Manager regarding diabetes management  Interventions:  . Evaluation of current treatment plan related to diabetes and patient's adherence to plan as established by provider. . Chart reviewed including recent office notes and lab results . Reviewed medications . Discussed home blood sugar readings . Reviewed upcoming appointments . Encouraged patient to reach out to Dr [name] with any blood sugar readings outside of recommended range . Encouraged patient to keep all medical appointments . Provided with CCM contact information and encouraged to reach out as needed  Patient Self Care Activities:  . Performs ADL's independently . Performs IADL's independently  Please see past updates related to this goal by clicking on the "Past Updates" button in the selected goal   Goal Complete: Patient is working with Dr [name] and his blood sugar is well controlled       Patient verbalizes understanding of instructions provided today.   Follow-up Plan The patient has been provided with contact information for the care management team and has been advised to call with any health related questions or concerns.  CCM enrollment status changed to "previously enrolled". Case closed to case management services in primary care home.  Follow up with endocrinology, cardiology, and PCP as planned   [name], BSN, RN-BC [place] [facility] [place] Management Direct Dial: [phone]

## 2020-04-02 ENCOUNTER — Other Ambulatory Visit: Payer: Self-pay

## 2020-04-02 ENCOUNTER — Encounter: Payer: Self-pay | Admitting: "Endocrinology

## 2020-04-02 ENCOUNTER — Ambulatory Visit (INDEPENDENT_AMBULATORY_CARE_PROVIDER_SITE_OTHER): Payer: 59 | Admitting: "Endocrinology

## 2020-04-02 VITALS — BP 138/92 | HR 68 | Ht 77.0 in | Wt 323.2 lb

## 2020-04-02 DIAGNOSIS — I10 Essential (primary) hypertension: Secondary | ICD-10-CM

## 2020-04-02 DIAGNOSIS — E1165 Type 2 diabetes mellitus with hyperglycemia: Secondary | ICD-10-CM

## 2020-04-02 DIAGNOSIS — E782 Mixed hyperlipidemia: Secondary | ICD-10-CM | POA: Diagnosis not present

## 2020-04-02 LAB — POCT GLYCOSYLATED HEMOGLOBIN (HGB A1C): Hemoglobin A1C: 5.7 % — AB (ref 4.0–5.6)

## 2020-04-02 LAB — POCT UA - MICROALBUMIN
Creatinine, POC: 300 mg/dL
Microalbumin Ur, POC: 80 mg/L

## 2020-04-02 NOTE — Patient Instructions (Signed)
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
## 2020-04-03 ENCOUNTER — Other Ambulatory Visit: Payer: Self-pay | Admitting: Family Medicine

## 2020-04-03 DIAGNOSIS — E1159 Type 2 diabetes mellitus with other circulatory complications: Secondary | ICD-10-CM

## 2020-04-03 DIAGNOSIS — I152 Hypertension secondary to endocrine disorders: Secondary | ICD-10-CM

## 2020-04-05 ENCOUNTER — Ambulatory Visit: Payer: Medicare HMO | Admitting: Urology

## 2020-04-09 ENCOUNTER — Other Ambulatory Visit: Payer: Self-pay | Admitting: Family Medicine

## 2020-04-09 ENCOUNTER — Other Ambulatory Visit: Payer: Self-pay | Admitting: "Endocrinology

## 2020-04-09 DIAGNOSIS — E1142 Type 2 diabetes mellitus with diabetic polyneuropathy: Secondary | ICD-10-CM

## 2020-04-11 ENCOUNTER — Ambulatory Visit (HOSPITAL_COMMUNITY)
Admission: RE | Admit: 2020-04-11 | Discharge: 2020-04-11 | Disposition: A | Discharge status: Home / Self Care | Payer: 59 | Source: Ambulatory Visit | Attending: Urology | Admitting: Urology

## 2020-04-11 ENCOUNTER — Other Ambulatory Visit: Payer: Self-pay

## 2020-04-11 DIAGNOSIS — Z905 Acquired absence of kidney: Secondary | ICD-10-CM | POA: Diagnosis not present

## 2020-04-11 DIAGNOSIS — C641 Malignant neoplasm of right kidney, except renal pelvis: Secondary | ICD-10-CM | POA: Insufficient documentation

## 2020-04-11 DIAGNOSIS — I700 Atherosclerosis of aorta: Secondary | ICD-10-CM | POA: Diagnosis not present

## 2020-04-11 LAB — POCT I-STAT CREATININE: Creatinine, Ser: 1.8 mg/dL — ABNORMAL HIGH (ref 0.61–1.24)

## 2020-04-11 MED ORDER — IOHEXOL 300 MG/ML  SOLN
100.0000 mL | Freq: Once | INTRAMUSCULAR | Status: AC | PRN
  Administered 2020-04-11: 80 mL via INTRAVENOUS

## 2020-04-19 ENCOUNTER — Other Ambulatory Visit: Payer: Self-pay

## 2020-04-19 ENCOUNTER — Ambulatory Visit: Payer: Medicare HMO | Admitting: Urology

## 2020-04-19 ENCOUNTER — Ambulatory Visit (INDEPENDENT_AMBULATORY_CARE_PROVIDER_SITE_OTHER): Payer: 59 | Admitting: Urology

## 2020-04-19 VITALS — BP 146/82 | HR 81 | Temp 98.4°F | Ht 75.0 in | Wt 325.8 lb

## 2020-04-19 DIAGNOSIS — N5201 Erectile dysfunction due to arterial insufficiency: Secondary | ICD-10-CM | POA: Diagnosis not present

## 2020-04-19 DIAGNOSIS — C641 Malignant neoplasm of right kidney, except renal pelvis: Secondary | ICD-10-CM

## 2020-04-19 LAB — URINALYSIS, ROUTINE W REFLEX MICROSCOPIC
Bilirubin, UA: NEGATIVE
Glucose, UA: NEGATIVE
Leukocytes,UA: NEGATIVE
Nitrite, UA: NEGATIVE
Specific Gravity, UA: 1.02 (ref 1.005–1.030)
Urobilinogen, Ur: 1 mg/dL (ref 0.2–1.0)
pH, UA: 6.5 (ref 5.0–7.5)

## 2020-04-19 LAB — MICROSCOPIC EXAMINATION: Renal Epithel, UA: NONE SEEN /hpf

## 2020-04-19 MED ORDER — TADALAFIL 20 MG PO TABS: 20.0000 mg | ORAL_TABLET | Freq: Every day | ORAL | 3 refills | Status: DC | PRN

## 2020-04-19 NOTE — Patient Instructions (Signed)
Erectile Dysfunction Erectile dysfunction (ED) is the inability to get or keep an erection in order to have sexual intercourse. Erectile dysfunction may include:  Inability to get an erection.  Lack of enough hardness of the erection to allow penetration.  Loss of the erection before sex is finished. What are the causes? This condition may be caused by:  Certain medicines, such as: ? Pain relievers. ? Antihistamines. ? Antidepressants. ? Blood pressure medicines. ? Water pills (diuretics). ? Ulcer medicines. ? Muscle relaxants. ? Drugs.  Excessive drinking.  Psychological causes, such as: ? Anxiety. ? Depression. ? Sadness. ? Exhaustion. ? Performance fear. ? Stress.  Physical causes, such as: ? Artery problems. This may include diabetes, smoking, liver disease, or atherosclerosis. ? High blood pressure. ? Hormonal problems, such as low testosterone. ? Obesity. ? Nerve problems. This may include back or pelvic injuries, diabetes mellitus, multiple sclerosis, or Parkinson disease. What are the signs or symptoms? Symptoms of this condition include:  Inability to get an erection.  Lack of enough hardness of the erection to allow penetration.  Loss of the erection before sex is finished.  Normal erections at some times, but with frequent unsatisfactory episodes.  Low sexual satisfaction in either partner due to erection problems.  A curved penis occurring with erection. The curve may cause pain or the penis may be too curved to allow for intercourse.  Never having nighttime erections. How is this diagnosed? This condition is often diagnosed by:  Performing a physical exam to find other diseases or specific problems with the penis.  Asking you detailed questions about the problem.  Performing blood tests to check for diabetes mellitus or to measure hormone levels.  Performing other tests to check for underlying health conditions.  Performing an ultrasound  exam to check for scarring.  Performing a test to check blood flow to the penis.  Doing a sleep study at home to measure nighttime erections. How is this treated? This condition may be treated by:  Medicine taken by mouth to help you achieve an erection (oral medicine).  Hormone replacement therapy to replace low testosterone levels.  Medicine that is injected into the penis. Your health care provider may instruct you how to give yourself these injections at home.  Vacuum pump. This is a pump with a ring on it. The pump and ring are placed on the penis and used to create pressure that helps the penis become erect.  Penile implant surgery. In this procedure, you may receive: ? An inflatable implant. This consists of cylinders, a pump, and a reservoir. The cylinders can be inflated with a fluid that helps to create an erection, and they can be deflated after intercourse. ? A semi-rigid implant. This consists of two silicone rubber rods. The rods provide some rigidity. They are also flexible, so the penis can both curve downward in its normal position and become straight for sexual intercourse.  Blood vessel surgery, to improve blood flow to the penis. During this procedure, a blood vessel from a different part of the body is placed into the penis to allow blood to flow around (bypass) damaged or blocked blood vessels.  Lifestyle changes, such as exercising more, losing weight, and quitting smoking. Follow these instructions at home: Medicines   Take over-the-counter and prescription medicines only as told by your health care provider. Do not increase the dosage without first discussing it with your health care provider.  If you are using self-injections, perform injections as directed by your   health care provider. Make sure to avoid any veins that are on the surface of the penis. After giving an injection, apply pressure to the injection site for 5 minutes. General  instructions  Exercise regularly, as directed by your health care provider. Work with your health care provider to lose weight, if needed.  Do not use any products that contain nicotine or tobacco, such as cigarettes and e-cigarettes. If you need help quitting, ask your health care provider.  Before using a vacuum pump, read the instructions that come with the pump and discuss any questions with your health care provider.  Keep all follow-up visits as told by your health care provider. This is important. Contact a health care provider if:  You feel nauseous.  You vomit. Get help right away if:  You are taking oral or injectable medicines and you have an erection that lasts longer than 4 hours. If your health care provider is unavailable, go to the nearest emergency room for evaluation. An erection that lasts much longer than 4 hours can result in permanent damage to your penis.  You have severe pain in your groin or abdomen.  You develop redness or severe swelling of your penis.  You have redness spreading up into your groin or lower abdomen.  You are unable to urinate.  You experience chest pain or a rapid heart beat (palpitations) after taking oral medicines. Summary  Erectile dysfunction (ED) is the inability to get or keep an erection during sexual intercourse. This problem can usually be treated successfully.  This condition is diagnosed based on a physical exam, your symptoms, and tests to determine the cause. Treatment varies depending on the cause, and may include medicines, hormone therapy, surgery, or vacuum pump.  You may need follow-up visits to make sure that you are using your medicines or devices correctly.  Get help right away if you are taking or injecting medicines and you have an erection that lasts longer than 4 hours. This information is not intended to replace advice given to you by your health care provider. Make sure you discuss any questions you have with  your health care provider. Document Revised: 04/17/2017 Document Reviewed: 05/21/2016 Elsevier Patient Education  2020 [employer]  

## 2020-04-19 NOTE — Progress Notes (Signed)
Urological Symptom Review  Patient is experiencing the following symptoms: Erection problems (male only)   Review of Systems  Gastrointestinal (upper)  : Negative for upper GI symptoms  Gastrointestinal (lower) : Negative for lower GI symptoms  Constitutional : Negative for symptoms  Skin: Negative for skin symptoms  Eyes: Negative for eye symptoms  Ear/Nose/Throat : Negative for Ear/Nose/Throat symptoms  Hematologic/Lymphatic: Negative for Hematologic/Lymphatic symptoms  Cardiovascular : Negative for cardiovascular symptoms  Respiratory : Negative for respiratory symptoms  Endocrine: Negative for endocrine symptoms  Musculoskeletal: Back pain Joint pain  Neurological: Negative for neurological symptoms  Psychologic: Negative for psychiatric symptoms
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
## 2020-04-23 ENCOUNTER — Ambulatory Visit (INDEPENDENT_AMBULATORY_CARE_PROVIDER_SITE_OTHER): Payer: 59 | Admitting: Family Medicine

## 2020-04-23 ENCOUNTER — Encounter: Payer: Self-pay | Admitting: Family Medicine

## 2020-04-23 ENCOUNTER — Other Ambulatory Visit: Payer: Self-pay

## 2020-04-23 VITALS — BP 131/77 | HR 79 | Temp 97.8°F | Ht 75.0 in | Wt 320.2 lb

## 2020-04-23 DIAGNOSIS — E11621 Type 2 diabetes mellitus with foot ulcer: Secondary | ICD-10-CM | POA: Diagnosis not present

## 2020-04-23 DIAGNOSIS — E785 Hyperlipidemia, unspecified: Secondary | ICD-10-CM

## 2020-04-23 DIAGNOSIS — Z23 Encounter for immunization: Secondary | ICD-10-CM | POA: Diagnosis not present

## 2020-04-23 DIAGNOSIS — E1169 Type 2 diabetes mellitus with other specified complication: Secondary | ICD-10-CM | POA: Diagnosis not present

## 2020-04-23 DIAGNOSIS — L97529 Non-pressure chronic ulcer of other part of left foot with unspecified severity: Secondary | ICD-10-CM

## 2020-04-23 DIAGNOSIS — E1142 Type 2 diabetes mellitus with diabetic polyneuropathy: Secondary | ICD-10-CM

## 2020-04-23 DIAGNOSIS — E1159 Type 2 diabetes mellitus with other circulatory complications: Secondary | ICD-10-CM

## 2020-04-23 DIAGNOSIS — L97519 Non-pressure chronic ulcer of other part of right foot with unspecified severity: Secondary | ICD-10-CM | POA: Diagnosis not present

## 2020-04-23 DIAGNOSIS — I152 Hypertension secondary to endocrine disorders: Secondary | ICD-10-CM

## 2020-04-23 MED ORDER — GABAPENTIN 600 MG PO TABS: ORAL_TABLET | ORAL | 3 refills | Status: DC
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
## 2020-04-30 ENCOUNTER — Encounter: Payer: Self-pay | Admitting: Urology

## 2020-05-02 ENCOUNTER — Ambulatory Visit: Payer: 59 | Admitting: Podiatry

## 2020-05-07 ENCOUNTER — Other Ambulatory Visit: Payer: Self-pay | Admitting: Family Medicine

## 2020-05-08 IMAGING — CT CT ANGIO CHEST
2 of 6 series · 16 of 36 positions shown · IV contrast (Omnipaque or Isovue)
Comparison: 08/10/2018

CLINICAL DATA: Follow-up ascending aortic dilatation.

EXAM:
CT ANGIOGRAPHY CHEST WITH CONTRAST
TECHNIQUE: Multidetector CT imaging of the chest was performed using the
standard protocol during bolus administration of intravenous
contrast. Multiplanar CT image reconstructions and MIPs were
obtained to evaluate the vascular anatomy.
CONTRAST:  100mL OMNIPAQUE IOHEXOL 350 MG/ML SOLN

[Series 7: lungs · axial · 0.74mm/px · z∈[+1288,+1564]mm · 15 of 158 slices shown]
[im 10/158  lung]
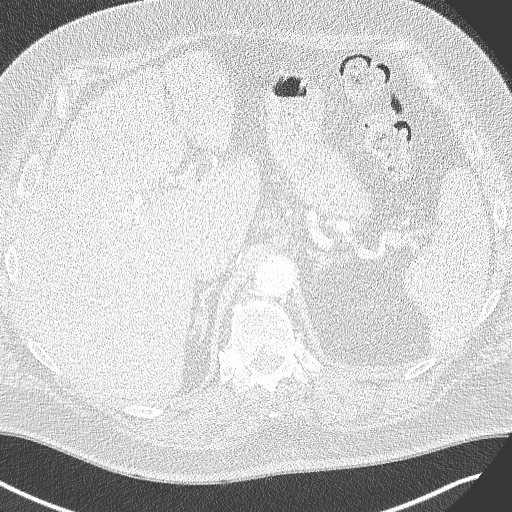
[im 20/158  mediastinal]
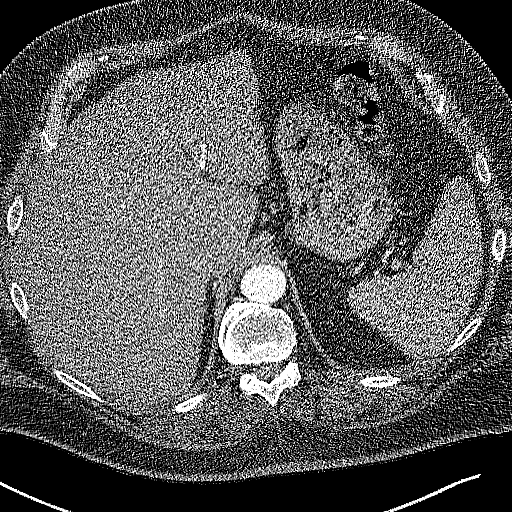
[im 30/158  lung]
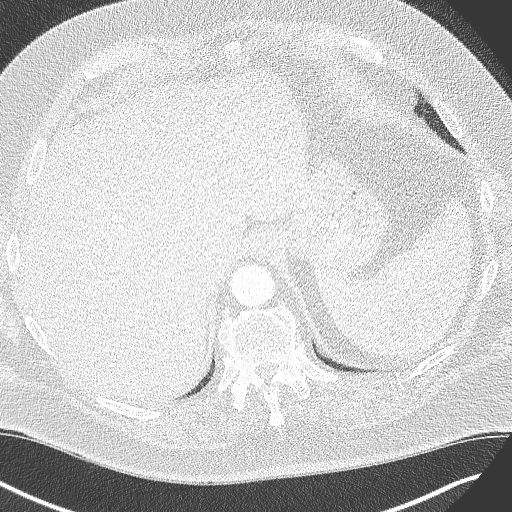
[im 40/158  mediastinal]
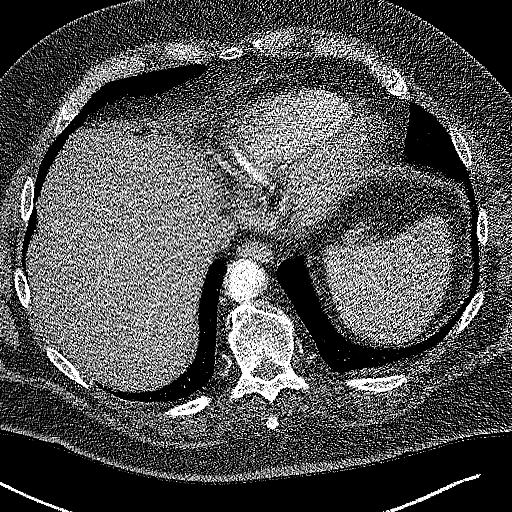
[im 50/158  lung]
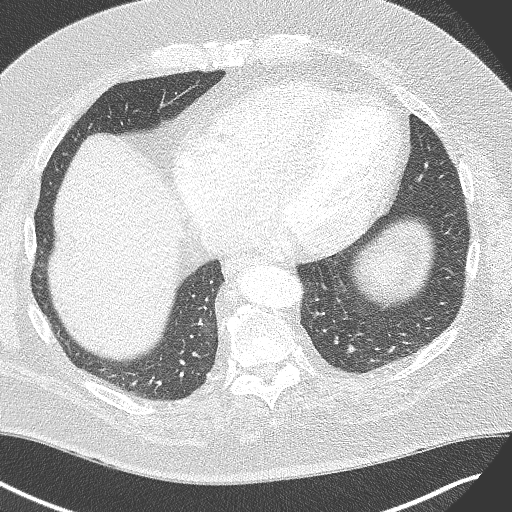
[im 59/158  mediastinal]
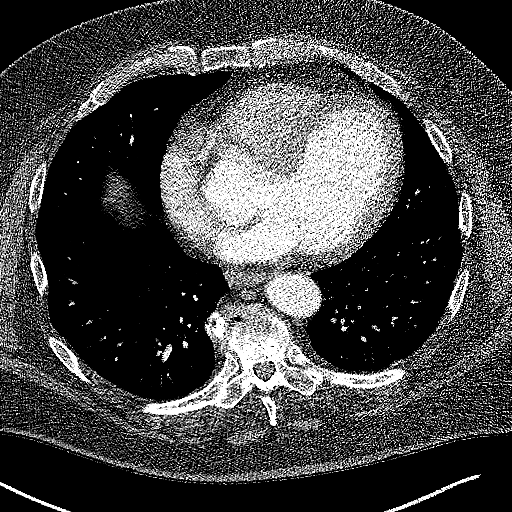
[im 69/158  lung]
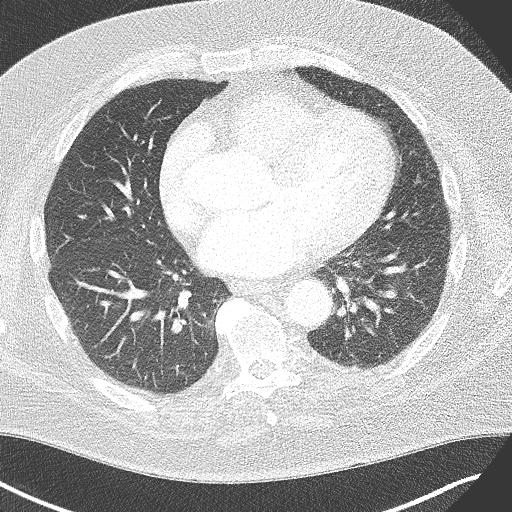
[im 79/158  mediastinal]
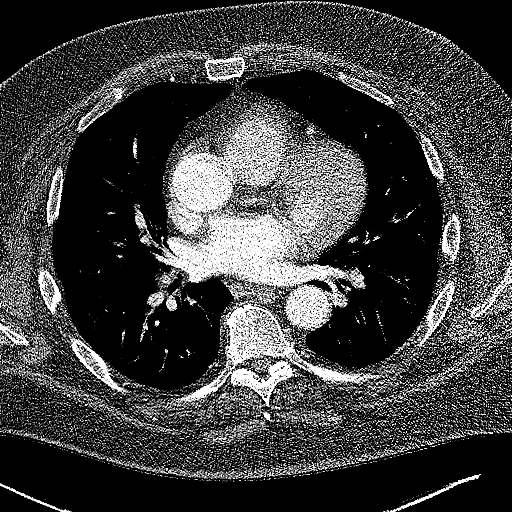
[im 89/158  lung]
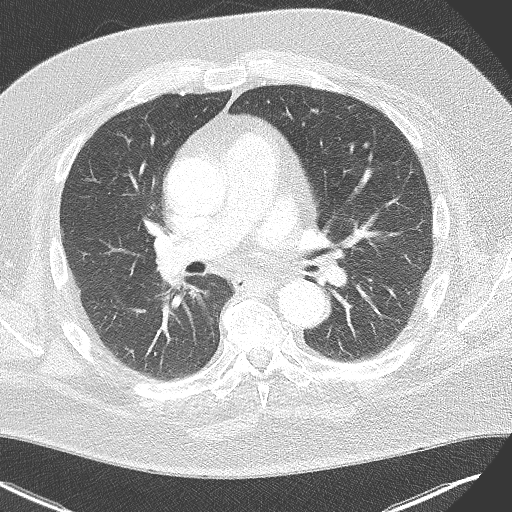
[im 99/158  mediastinal]
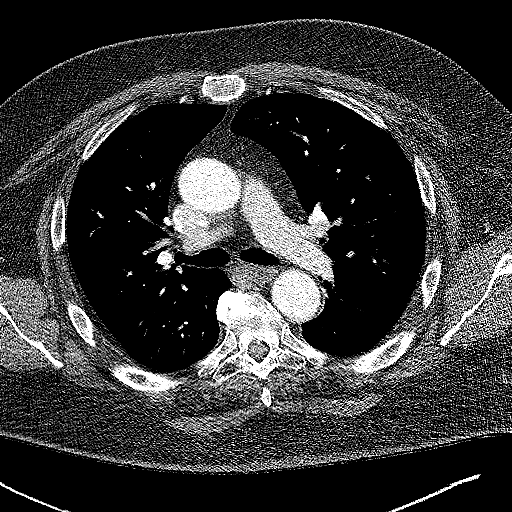
[im 108/158  lung]
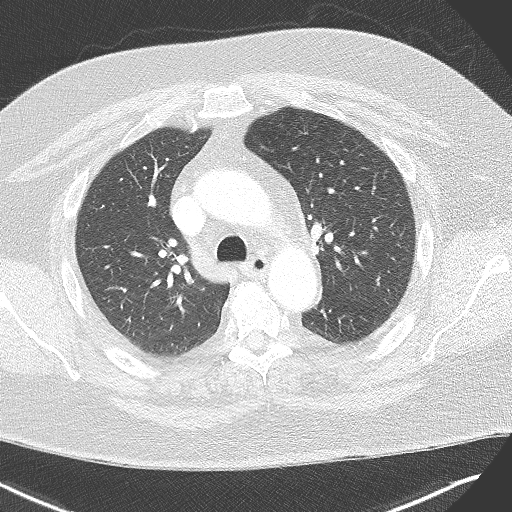
[im 118/158  mediastinal]
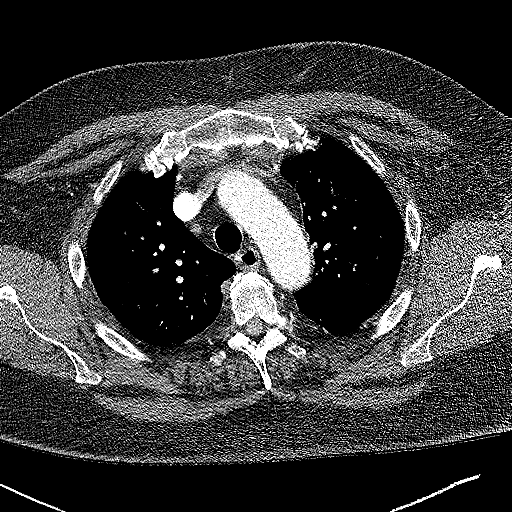
[im 128/158  lung]
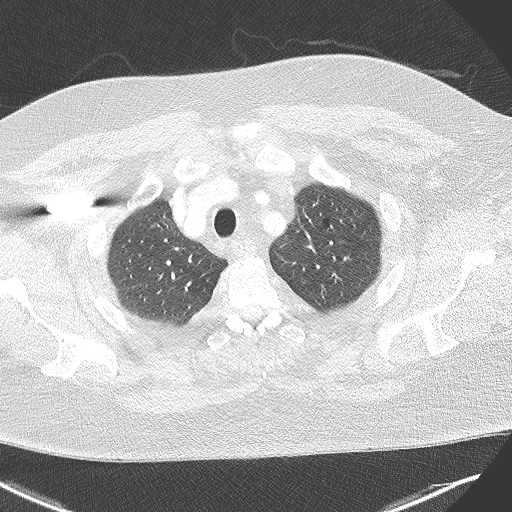
[im 138/158  mediastinal]
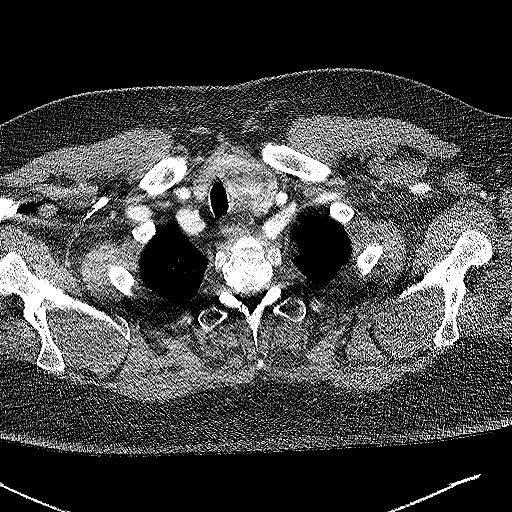
[im 148/158  lung]
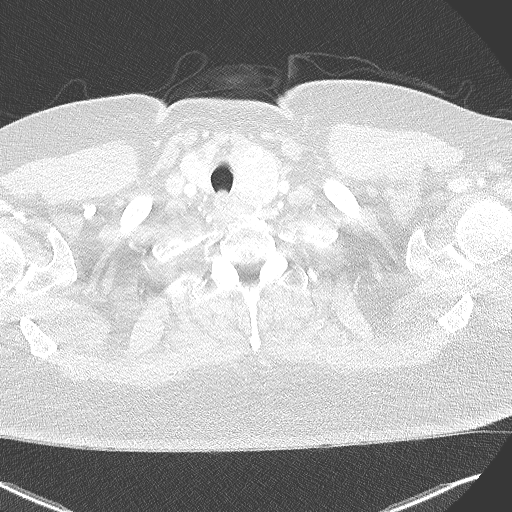

[Series 8: cor soft · coronal · 0.65mm/px · 1 of 158 slices shown]
[im 79/158  mediastinal]
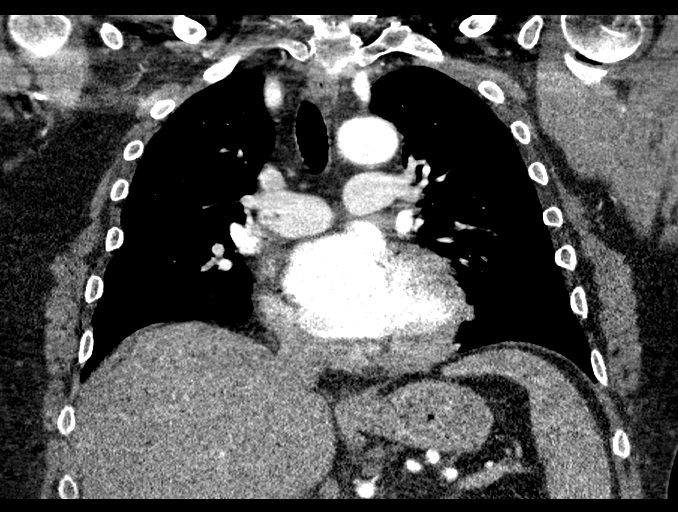

[16 of 36 positions shown; findings below may reference images not displayed]

FINDINGS: Cardiovascular: Mild stable cardiomegaly. Minimal calcified plaque
over the left anterior descending coronary artery. Continued mild
aneurysmal dilatation of the ascending thoracic aorta measuring
approximately 4.3 cm in AP diameter unchanged. No evidence of
dissection. Remainder of the thoracic aorta is unremarkable. Normal
takeoff of the great vessels. Remaining vascular structures are
unremarkable.

Mediastinum/Nodes: No mediastinal or hilar adenopathy. Remaining
mediastinal structures are unremarkable. Slightly enlarged
heterogeneous left thyroid gland with dominant nodule over the lower
pole measuring 2.3 mm. This is unchanged and shown to be benign on
previous biopsy 08/22/2015.

Lungs/Pleura: Lungs are adequately inflated without focal airspace
consolidation or effusion. Airways are normal.

Upper Abdomen: No acute findings.

Musculoskeletal: Degenerative change of the spine.

Review of the MIP images confirms the above findings.
IMPRESSION: 1.  No acute findings.

2. Stable mild aneurysmal dilatation of the ascending thoracic aorta
measuring 4.3 cm in AP diameter. Recommend annual imaging followup
by CTA or MRA. This recommendation follows 9666
ACCF/AHA/AATS/ACR/ASA/SCA/EDAN/AKEEM/TAKENDO/HAEMMERLE Guidelines for the
Diagnosis and Management of Patients with Thoracic Aortic Disease.
Circulation. 9666; 121: [id number]. Aortic aneurysm NOS
([id number]).

3. Mild cardiomegaly with minimal atherosclerotic coronary artery
disease.

## 2020-05-21 ENCOUNTER — Other Ambulatory Visit: Payer: Self-pay | Admitting: Family Medicine

## 2020-05-21 DIAGNOSIS — E1142 Type 2 diabetes mellitus with diabetic polyneuropathy: Secondary | ICD-10-CM

## 2020-06-09 ENCOUNTER — Other Ambulatory Visit: Payer: Self-pay | Admitting: Family Medicine

## 2020-06-09 DIAGNOSIS — F32A Depression, unspecified: Secondary | ICD-10-CM

## 2020-06-09 DIAGNOSIS — I152 Hypertension secondary to endocrine disorders: Secondary | ICD-10-CM

## 2020-06-09 DIAGNOSIS — E1159 Type 2 diabetes mellitus with other circulatory complications: Secondary | ICD-10-CM

## 2020-07-02 ENCOUNTER — Other Ambulatory Visit: Payer: Self-pay | Admitting: Family Medicine

## 2020-07-02 DIAGNOSIS — I152 Hypertension secondary to endocrine disorders: Secondary | ICD-10-CM

## 2020-07-02 DIAGNOSIS — E1159 Type 2 diabetes mellitus with other circulatory complications: Secondary | ICD-10-CM

## 2020-07-04 ENCOUNTER — Other Ambulatory Visit: Payer: Self-pay | Admitting: "Endocrinology

## 2020-07-27 DIAGNOSIS — E782 Mixed hyperlipidemia: Secondary | ICD-10-CM | POA: Diagnosis not present

## 2020-07-27 DIAGNOSIS — E1165 Type 2 diabetes mellitus with hyperglycemia: Secondary | ICD-10-CM | POA: Diagnosis not present

## 2020-07-28 LAB — COMPREHENSIVE METABOLIC PANEL
ALT: 24 IU/L (ref 0–44)
AST: 26 IU/L (ref 0–40)
Albumin/Globulin Ratio: 1.8 (ref 1.2–2.2)
Albumin: 4.6 g/dL (ref 3.8–4.9)
Alkaline Phosphatase: 77 IU/L (ref 44–121)
BUN/Creatinine Ratio: 16 (ref 9–20)
BUN: 22 mg/dL (ref 6–24)
Bilirubin Total: 0.7 mg/dL (ref 0.0–1.2)
CO2: 28 mmol/L (ref 20–29)
Calcium: 9.6 mg/dL (ref 8.7–10.2)
Chloride: 97 mmol/L (ref 96–106)
Creatinine, Ser: 1.4 mg/dL — ABNORMAL HIGH (ref 0.76–1.27)
Globulin, Total: 2.6 g/dL (ref 1.5–4.5)
Glucose: 127 mg/dL — ABNORMAL HIGH (ref 65–99)
Potassium: 4.3 mmol/L (ref 3.5–5.2)
Sodium: 142 mmol/L (ref 134–144)
Total Protein: 7.2 g/dL (ref 6.0–8.5)
eGFR: 58 mL/min/{1.73_m2} — ABNORMAL LOW (ref >59)

## 2020-07-28 LAB — LIPID PANEL
Chol/HDL Ratio: 3.5 ratio (ref 0.0–5.0)
Cholesterol, Total: 106 mg/dL (ref 100–199)
HDL: 30 mg/dL — ABNORMAL LOW (ref >39)
LDL Chol Calc (NIH): 30 mg/dL (ref 0–99)
Triglycerides: 308 mg/dL — ABNORMAL HIGH (ref 0–149)
VLDL Cholesterol Cal: 46 mg/dL — ABNORMAL HIGH (ref 5–40)

## 2020-07-28 LAB — VITAMIN D 25 HYDROXY (VIT D DEFICIENCY, FRACTURES): Vit D, 25-Hydroxy: 47.4 ng/mL (ref 30.0–100.0)

## 2020-07-31 ENCOUNTER — Encounter: Payer: Self-pay | Admitting: "Endocrinology

## 2020-07-31 ENCOUNTER — Other Ambulatory Visit: Payer: Self-pay

## 2020-07-31 ENCOUNTER — Ambulatory Visit (INDEPENDENT_AMBULATORY_CARE_PROVIDER_SITE_OTHER): Payer: 59 | Admitting: "Endocrinology

## 2020-07-31 VITALS — BP 137/87 | HR 76 | Ht 75.0 in | Wt 325.4 lb

## 2020-07-31 DIAGNOSIS — E1165 Type 2 diabetes mellitus with hyperglycemia: Secondary | ICD-10-CM

## 2020-07-31 DIAGNOSIS — I10 Essential (primary) hypertension: Secondary | ICD-10-CM | POA: Diagnosis not present

## 2020-07-31 DIAGNOSIS — E782 Mixed hyperlipidemia: Secondary | ICD-10-CM | POA: Diagnosis not present

## 2020-07-31 DIAGNOSIS — Z6841 Body Mass Index (BMI) 40.0 and over, adult: Secondary | ICD-10-CM | POA: Diagnosis not present

## 2020-07-31 LAB — POCT GLYCOSYLATED HEMOGLOBIN (HGB A1C): HbA1c, POC (controlled diabetic range): 6.3 % (ref 0.0–7.0)
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
## 2020-07-31 NOTE — Patient Instructions (Signed)

## 2020-08-02 ENCOUNTER — Ambulatory Visit: Payer: 59 | Admitting: Family Medicine

## 2020-08-03 ENCOUNTER — Other Ambulatory Visit: Payer: Self-pay

## 2020-08-03 ENCOUNTER — Encounter: Payer: Self-pay | Admitting: Nurse Practitioner

## 2020-08-03 ENCOUNTER — Ambulatory Visit (INDEPENDENT_AMBULATORY_CARE_PROVIDER_SITE_OTHER): Payer: 59 | Admitting: Nurse Practitioner

## 2020-08-03 VITALS — BP 120/76 | HR 81 | Temp 96.4°F | Ht 75.0 in | Wt 319.0 lb

## 2020-08-03 DIAGNOSIS — L03115 Cellulitis of right lower limb: Secondary | ICD-10-CM | POA: Diagnosis not present

## 2020-08-03 MED ORDER — CEFTRIAXONE SODIUM 1 G IJ SOLR
1.0000 g | Freq: Once | INTRAMUSCULAR | Status: AC
  Administered 2020-08-03: 1 g via INTRAMUSCULAR

## 2020-08-03 MED ORDER — CEPHALEXIN 500 MG PO CAPS: 500.0000 mg | ORAL_CAPSULE | Freq: Two times a day (BID) | ORAL | 0 refills | Status: DC

## 2020-08-03 NOTE — Assessment & Plan Note (Signed)
Cellulitis of the right leg symptoms started 2 days ago.  Patient is reporting slight burning and redness.  1 g Rocephin given in clinic.  Started patient on Keflex.  Follow-up in 3 to 4 days.  Education provided to patient with printed handouts given.  Rx sent to pharmacy.

## 2020-08-03 NOTE — Addendum Note (Signed)
Addended by: Christia Reading on: 08/03/2020 08:37 AM   Modules accepted: Orders

## 2020-08-03 NOTE — Patient Instructions (Signed)
1 gram rocephin in clinic, keflex, follow up in 3 days.  Cellulitis, Adult  Cellulitis is a skin infection. The infected area is often warm, red, swollen, and sore. It occurs most often in the arms and lower legs. It is very important to get treated for this condition. What are the causes? This condition is caused by bacteria. The bacteria enter through a break in the skin, such as a cut, burn, insect bite, open sore, or crack. What increases the risk? This condition is more likely to occur in people who:  Have a weak body defense system (immune system).  Have open cuts, burns, bites, or scrapes on the skin.  Are older than 60 years of age.  Have a blood sugar problem (diabetes).  Have a long-lasting (chronic) liver disease (cirrhosis) or kidney disease.  Are very overweight (obese).  Have a skin problem, such as: ? Itchy rash (eczema). ? Slow movement of blood in the veins (venous stasis). ? Fluid buildup below the skin (edema).  Have been treated with high-energy rays (radiation).  Use IV drugs. What are the signs or symptoms? Symptoms of this condition include:  Skin that is: ? Red. ? Streaking. ? Spotting. ? Swollen. ? Sore or painful when you touch it. ? Warm.  A fever.  Chills.  Blisters. How is this diagnosed? This condition is diagnosed based on:  Medical history.  Physical exam.  Blood tests.  Imaging tests. How is this treated? Treatment for this condition may include:  Medicines to treat infections or allergies.  Home care, such as: ? Rest. ? Placing cold or warm cloths (compresses) on the skin.  Hospital care, if the condition is very bad. Follow these instructions at home: Medicines  Take over-the-counter and prescription medicines only as told by your doctor.  If you were prescribed an antibiotic medicine, take it as told by your doctor. Do not stop taking it even if you start to feel better. General instructions  Drink enough  fluid to keep your pee (urine) pale yellow.  Do not touch or rub the infected area.  Raise (elevate) the infected area above the level of your heart while you are sitting or lying down.  Place cold or warm cloths on the area as told by your doctor.  Keep all follow-up visits as told by your doctor. This is important.   Contact a doctor if:  You have a fever.  You do not start to get better after 1-2 days of treatment.  Your bone or joint under the infected area starts to hurt after the skin has healed.  Your infection comes back. This can happen in the same area or another area.  You have a swollen bump in the area.  You have new symptoms.  You feel ill and have muscle aches and pains. Get help right away if:  Your symptoms get worse.  You feel very sleepy.  You throw up (vomit) or have watery poop (diarrhea) for a long time.  You see red streaks coming from the area.  Your red area gets larger.  Your red area turns dark in color. These symptoms may represent a serious problem that is an emergency. Do not wait to see if the symptoms will go away. Get medical help right away. Call your local emergency services (911 in the [place] Do not drive yourself to the hospital. Summary  Cellulitis is a skin infection. The area is often warm, red, swollen, and sore.  This condition is treated  with medicines, rest, and cold and warm cloths.  Take all medicines only as told by your doctor.  Tell your doctor if symptoms do not start to get better after 1-2 days of treatment. This information is not intended to replace advice given to you by your health care provider. Make sure you discuss any questions you have with your health care provider. Document Revised: 09/24/2017 Document Reviewed: 09/24/2017 Elsevier Patient Education  [place].
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
## 2020-08-04 ENCOUNTER — Other Ambulatory Visit: Payer: Self-pay | Admitting: Family Medicine

## 2020-08-06 ENCOUNTER — Ambulatory Visit (INDEPENDENT_AMBULATORY_CARE_PROVIDER_SITE_OTHER): Payer: 59 | Admitting: Nurse Practitioner

## 2020-08-06 ENCOUNTER — Encounter: Payer: Self-pay | Admitting: Nurse Practitioner

## 2020-08-06 ENCOUNTER — Other Ambulatory Visit: Payer: Self-pay

## 2020-08-06 VITALS — BP 132/83 | HR 85 | Temp 98.4°F | Resp 20 | Ht 75.0 in | Wt 317.0 lb

## 2020-08-06 DIAGNOSIS — L03115 Cellulitis of right lower limb: Secondary | ICD-10-CM | POA: Diagnosis not present

## 2020-08-06 NOTE — Patient Instructions (Signed)

## 2020-08-06 NOTE — Progress Notes (Signed)
   Subjective:    Patient ID: Anthony Logan, male    DOB: 01/25/1961, 60 y.o.   MRN: [id number]   Chief Complaint: Recheck cellulitis right leg   HPI Patient was seen on 08/03/20, by another provider with cellulitis of right lower ext. He was given keflex 500mg . Since taking the keflex the redness in right lower leg has resolved and he no longer has any swelling.   Review of Systems  Constitutional: Negative for diaphoresis.  Eyes: Negative for pain.  Respiratory: Negative for shortness of breath.   Cardiovascular: Negative for chest pain, palpitations and leg swelling.  Gastrointestinal: Negative for abdominal pain.  Endocrine: Negative for polydipsia.  Skin: Negative for rash.  Neurological: Negative for dizziness, weakness and headaches.  Hematological: Does not bruise/bleed easily.  All other systems reviewed and are negative.      Objective:   Physical Exam Vitals and nursing note reviewed.  Constitutional:      Appearance: Normal appearance.  Cardiovascular:     Rate and Rhythm: Normal rate and regular rhythm.     Heart sounds: Normal heart sounds.  Pulmonary:     Effort: Pulmonary effort is normal.     Breath sounds: Normal breath sounds.  Skin:    General: Skin is warm.  Neurological:     General: No focal deficit present.     Mental Status: He is alert.  Psychiatric:        Mood and Affect: Mood normal.    BP 132/83   Pulse 85   Temp 98.4 F (36.9 C) (Temporal)   Resp 20   Ht 6\' 3"  (1.905 m)   Wt (!) 317 lb (143.8 kg)   SpO2 95%   BMI 39.62 kg/m          Assessment & Plan:  [name] in today with chief complaint of Recheck cellulitis right leg   1. Cellulitis of right leg Has resolved Finish antibiotics as prescribed Compression socks when on feet a lot RTO prn    The above assessment and management plan was discussed with the patient. The patient verbalized understanding of and has agreed to the management plan. Patient is aware  to call the clinic if symptoms persist or worsen. Patient is aware when to return to the clinic for a follow-up visit. Patient educated on when it is appropriate to go to the emergency department.   [name], FNP

## 2020-09-06 ENCOUNTER — Encounter: Payer: Self-pay | Admitting: Cardiology

## 2020-09-06 ENCOUNTER — Other Ambulatory Visit: Payer: Self-pay | Admitting: *Deleted

## 2020-09-06 ENCOUNTER — Ambulatory Visit (INDEPENDENT_AMBULATORY_CARE_PROVIDER_SITE_OTHER): Payer: 59 | Admitting: Cardiology

## 2020-09-06 ENCOUNTER — Other Ambulatory Visit: Payer: Self-pay

## 2020-09-06 VITALS — BP 120/84 | HR 96 | Ht 77.0 in | Wt 328.0 lb

## 2020-09-06 DIAGNOSIS — I351 Nonrheumatic aortic (valve) insufficiency: Secondary | ICD-10-CM | POA: Diagnosis not present

## 2020-09-06 DIAGNOSIS — I10 Essential (primary) hypertension: Secondary | ICD-10-CM

## 2020-09-06 DIAGNOSIS — I712 Thoracic aortic aneurysm, without rupture, unspecified: Secondary | ICD-10-CM

## 2020-09-06 DIAGNOSIS — I77810 Thoracic aortic ectasia: Secondary | ICD-10-CM | POA: Diagnosis not present

## 2020-09-06 NOTE — Patient Instructions (Addendum)
Medication Instructions:   Your physician recommends that you continue on your current medications as directed. Please refer to the Current Medication list given to you today.  Labwork:  none  Testing/Procedures:  CTA chest & aorta  Follow-Up:  Your physician recommends that you schedule a follow-up appointment in: 1 year. You will receive a reminder letter in the mail in about 10 months reminding you to call and schedule your appointment. If you don't receive this letter, please contact our office.  Any Other Special Instructions Will Be Listed Below (If Applicable).  If you need a refill on your cardiac medications before your next appointment, please call your pharmacy.
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
## 2020-09-27 ENCOUNTER — Telehealth: Payer: Self-pay | Admitting: *Deleted

## 2020-09-27 DIAGNOSIS — I10 Essential (primary) hypertension: Secondary | ICD-10-CM | POA: Diagnosis not present

## 2020-09-27 NOTE — Telephone Encounter (Signed)
Patient informed. Copy sent to PCP °

## 2020-09-27 NOTE — Telephone Encounter (Signed)
-----   Message from Satira Sark, MD sent at 09/27/2020  4:16 PM EDT ----- Creatinine 1.58, potassium normal.  Previous creatinine 1.4.  Results to PCP. ----- Message ----- From: Desma Paganini Sent: 09/27/2020   4:05 PM EDT To: Satira Sark, MD

## 2020-10-02 ENCOUNTER — Telehealth: Payer: Self-pay | Admitting: *Deleted

## 2020-10-02 ENCOUNTER — Ambulatory Visit (HOSPITAL_COMMUNITY)
Admission: RE | Admit: 2020-10-02 | Discharge: 2020-10-02 | Disposition: A | Discharge status: Home / Self Care | Payer: 59 | Source: Ambulatory Visit | Attending: Cardiology | Admitting: Cardiology

## 2020-10-02 DIAGNOSIS — I712 Thoracic aortic aneurysm, without rupture, unspecified: Secondary | ICD-10-CM

## 2020-10-02 DIAGNOSIS — I2510 Atherosclerotic heart disease of native coronary artery without angina pectoris: Secondary | ICD-10-CM | POA: Diagnosis not present

## 2020-10-02 MED ORDER — IOHEXOL 350 MG/ML SOLN
100.0000 mL | Freq: Once | INTRAVENOUS | Status: AC | PRN
  Administered 2020-10-02: 100 mL via INTRAVENOUS

## 2020-10-02 NOTE — Telephone Encounter (Signed)
Patient informed. Copy sent to PCP °

## 2020-10-02 NOTE — Telephone Encounter (Signed)
-----   Message from Satira Sark, MD sent at 10/02/2020  9:52 AM EDT ----- Results reviewed.  Follow-up chest CTA shows stable ascending thoracic aortic aneurysm at 42 mm.  Anticipate reimaging in 1 year.

## 2020-10-10 ENCOUNTER — Other Ambulatory Visit: Payer: Self-pay | Admitting: "Endocrinology

## 2020-10-24 ENCOUNTER — Ambulatory Visit: Payer: 59 | Admitting: Family Medicine

## 2020-10-26 ENCOUNTER — Other Ambulatory Visit: Payer: Self-pay | Admitting: Family Medicine

## 2020-10-31 ENCOUNTER — Ambulatory Visit (INDEPENDENT_AMBULATORY_CARE_PROVIDER_SITE_OTHER): Payer: 59 | Admitting: Podiatry

## 2020-10-31 ENCOUNTER — Other Ambulatory Visit: Payer: Self-pay

## 2020-10-31 DIAGNOSIS — E1165 Type 2 diabetes mellitus with hyperglycemia: Secondary | ICD-10-CM

## 2020-10-31 DIAGNOSIS — L97522 Non-pressure chronic ulcer of other part of left foot with fat layer exposed: Secondary | ICD-10-CM | POA: Diagnosis not present

## 2020-10-31 DIAGNOSIS — L97512 Non-pressure chronic ulcer of other part of right foot with fat layer exposed: Secondary | ICD-10-CM

## 2020-11-02 ENCOUNTER — Encounter: Payer: Self-pay | Admitting: Podiatry
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
## 2020-11-09 ENCOUNTER — Telehealth: Payer: Self-pay | Admitting: *Deleted

## 2020-11-09 NOTE — Telephone Encounter (Signed)
Patient is calling because after using a Betadine solution on toes,blisters have developed surrounding the wound,redness on both toes. Instructed the patient to discontinue use. Please advise.

## 2020-11-09 NOTE — Telephone Encounter (Signed)
Called and informed patient per Dr Vivia Ewing message, verbalized understanding.

## 2020-11-14 ENCOUNTER — Other Ambulatory Visit: Payer: Self-pay | Admitting: Family Medicine

## 2020-11-16 ENCOUNTER — Ambulatory Visit (INDEPENDENT_AMBULATORY_CARE_PROVIDER_SITE_OTHER): Payer: 59 | Admitting: Podiatry

## 2020-11-16 ENCOUNTER — Other Ambulatory Visit: Payer: Self-pay

## 2020-11-16 DIAGNOSIS — L97522 Non-pressure chronic ulcer of other part of left foot with fat layer exposed: Secondary | ICD-10-CM

## 2020-11-16 DIAGNOSIS — L97512 Non-pressure chronic ulcer of other part of right foot with fat layer exposed: Secondary | ICD-10-CM | POA: Diagnosis not present

## 2020-11-16 DIAGNOSIS — E1165 Type 2 diabetes mellitus with hyperglycemia: Secondary | ICD-10-CM

## 2020-11-20 ENCOUNTER — Encounter: Payer: Self-pay | Admitting: Podiatry
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
## 2020-11-29 ENCOUNTER — Other Ambulatory Visit: Payer: Self-pay | Admitting: Family Medicine

## 2020-11-29 DIAGNOSIS — E1159 Type 2 diabetes mellitus with other circulatory complications: Secondary | ICD-10-CM

## 2020-11-29 DIAGNOSIS — I152 Hypertension secondary to endocrine disorders: Secondary | ICD-10-CM

## 2020-11-29 DIAGNOSIS — F32A Depression, unspecified: Secondary | ICD-10-CM

## 2020-12-07 ENCOUNTER — Encounter: Payer: Self-pay | Admitting: Podiatry

## 2020-12-07 ENCOUNTER — Ambulatory Visit (INDEPENDENT_AMBULATORY_CARE_PROVIDER_SITE_OTHER): Payer: 59 | Admitting: Podiatry

## 2020-12-07 ENCOUNTER — Other Ambulatory Visit: Payer: Self-pay

## 2020-12-07 DIAGNOSIS — L97522 Non-pressure chronic ulcer of other part of left foot with fat layer exposed: Secondary | ICD-10-CM | POA: Diagnosis not present

## 2020-12-07 DIAGNOSIS — E1165 Type 2 diabetes mellitus with hyperglycemia: Secondary | ICD-10-CM | POA: Diagnosis not present

## 2020-12-07 DIAGNOSIS — L97512 Non-pressure chronic ulcer of other part of right foot with fat layer exposed: Secondary | ICD-10-CM | POA: Diagnosis not present

## 2020-12-07 MED ORDER — SILVER SULFADIAZINE 1 % EX CREA: 1.0000 "application " | TOPICAL_CREAM | Freq: Every day | CUTANEOUS | 0 refills | Status: DC
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
## 2020-12-16 ENCOUNTER — Encounter: Payer: Self-pay | Admitting: Family Medicine

## 2020-12-17 ENCOUNTER — Encounter: Payer: Self-pay | Admitting: Family

## 2020-12-17 ENCOUNTER — Ambulatory Visit (INDEPENDENT_AMBULATORY_CARE_PROVIDER_SITE_OTHER): Payer: 59 | Admitting: Family

## 2020-12-17 DIAGNOSIS — I10 Essential (primary) hypertension: Secondary | ICD-10-CM | POA: Diagnosis not present

## 2020-12-17 DIAGNOSIS — C641 Malignant neoplasm of right kidney, except renal pelvis: Secondary | ICD-10-CM | POA: Diagnosis not present

## 2020-12-17 DIAGNOSIS — U071 COVID-19: Secondary | ICD-10-CM

## 2020-12-17 DIAGNOSIS — E1165 Type 2 diabetes mellitus with hyperglycemia: Secondary | ICD-10-CM | POA: Diagnosis not present

## 2020-12-17 MED ORDER — ALBUTEROL SULFATE HFA 108 (90 BASE) MCG/ACT IN AERS: 2.0000 | INHALATION_SPRAY | Freq: Four times a day (QID) | RESPIRATORY_TRACT | 0 refills | Status: DC | PRN

## 2020-12-17 MED ORDER — MOLNUPIRAVIR EUA 200MG CAPSULE: 4.0000 | ORAL_CAPSULE | Freq: Two times a day (BID) | ORAL | 0 refills | Status: AC

## 2020-12-17 NOTE — Progress Notes (Signed)
Virtual Visit  Note Due to COVID-19 pandemic this visit was conducted virtually. This visit type was conducted due to national recommendations for restrictions regarding the COVID-19 Pandemic (e.g. social distancing, sheltering in place) in an effort to limit this patient's exposure and mitigate transmission in our community. All issues noted in this document were discussed and addressed.  A physical exam was not performed with this format.  I connected with Anthony Logan on 12/17/20 at 10:23 AM  by telephone and verified that I am speaking with the correct person using two identifiers. Anthony Logan is currently located at home and no one is currently with him during visit. The provider, Evelina Dun, FNP is located in their office at time of visit.  I discussed the limitations, risks, security and privacy concerns of performing an evaluation and management service by telephone and the availability of in person appointments. I also discussed with the patient that there may be a patient responsible charge related to this service. The patient expressed understanding and agreed to proceed.   History and Present Illness:  Pt calls the office today with COVID. He states his symptoms started on 12/14/20 and tested positive yesterday.  Cough This is a new problem. The current episode started in the past 7 days. The problem has been waxing and waning. The problem occurs every few minutes. The cough is Non-productive. Associated symptoms include chills, headaches, myalgias, nasal congestion, postnasal drip, a sore throat and wheezing. Pertinent negatives include no ear congestion, ear pain, fever or shortness of breath. The symptoms are aggravated by lying down. He has tried rest and OTC cough suppressant for the symptoms. The treatment provided mild relief.     Review of Systems  Constitutional:  Positive for chills. Negative for fever.  HENT:  Positive for postnasal drip and sore throat. Negative  for ear pain.   Respiratory:  Positive for cough and wheezing. Negative for shortness of breath.   Musculoskeletal:  Positive for myalgias.  Neurological:  Positive for headaches.    Observations/Objective: No SOB or distress noted, hoarse voice  Assessment and Plan: 1. COVID-19 virus detected COVID positive, rest, force fluids, tylenol as needed, Quarantine for at least 5 days and fever free, report any worsening symptoms such as increased shortness of breath, swelling, or continued high fevers.  Possible adverse effects discussed Do not hesitate to go to ED if symptoms worsen  - molnupiravir EUA 200 mg CAPS; Take 4 capsules (800 mg total) by mouth 2 (two) times daily for 5 days.  Dispense: 40 capsule; Refill: 0 - albuterol (VENTOLIN HFA) 108 (90 Base) MCG/ACT inhaler; Inhale 2 puffs into the lungs every 6 (six) hours as needed for wheezing or shortness of breath.  Dispense: 8 g; Refill: 0  2. Essential hypertension, benign  3. Uncontrolled type 2 diabetes mellitus with hyperglycemia ([place])  4. Renal cell cancer, right ([place])       I discussed the assessment and treatment plan with the patient. The patient was provided an opportunity to ask questions and all were answered. The patient agreed with the plan and demonstrated an understanding of the instructions.   The patient was advised to call back or seek an in-person evaluation if the symptoms worsen or if the condition fails to improve as anticipated.  The above assessment and management plan was discussed with the patient. The patient verbalized understanding of and has agreed to the management plan. Patient is aware to call the clinic if symptoms persist or  worsen. Patient is aware when to return to the clinic for a follow-up visit. Patient educated on when it is appropriate to go to the emergency department.   Time call ended:  10:34 AM   I provided 11 minutes of  non face-to-face time during this encounter.    Evelina Dun, FNP

## 2020-12-19 ENCOUNTER — Ambulatory Visit: Payer: 59 | Admitting: Family Medicine

## 2020-12-21 ENCOUNTER — Ambulatory Visit: Payer: 59 | Admitting: Podiatry

## 2020-12-22 ENCOUNTER — Other Ambulatory Visit: Payer: Self-pay | Admitting: Family Medicine

## 2020-12-22 DIAGNOSIS — E1159 Type 2 diabetes mellitus with other circulatory complications: Secondary | ICD-10-CM

## 2020-12-22 DIAGNOSIS — I152 Hypertension secondary to endocrine disorders: Secondary | ICD-10-CM

## 2020-12-24 ENCOUNTER — Ambulatory Visit: Payer: 59 | Admitting: "Endocrinology

## 2020-12-26 ENCOUNTER — Other Ambulatory Visit: Payer: Self-pay

## 2020-12-26 ENCOUNTER — Ambulatory Visit (INDEPENDENT_AMBULATORY_CARE_PROVIDER_SITE_OTHER): Payer: 59 | Admitting: Podiatry

## 2020-12-26 VITALS — Temp 98.7°F

## 2020-12-26 DIAGNOSIS — L97522 Non-pressure chronic ulcer of other part of left foot with fat layer exposed: Secondary | ICD-10-CM

## 2020-12-26 DIAGNOSIS — E1165 Type 2 diabetes mellitus with hyperglycemia: Secondary | ICD-10-CM

## 2020-12-26 DIAGNOSIS — L97512 Non-pressure chronic ulcer of other part of right foot with fat layer exposed: Secondary | ICD-10-CM

## 2020-12-27 ENCOUNTER — Telehealth: Payer: Self-pay | Admitting: Podiatry

## 2020-12-27 NOTE — Telephone Encounter (Signed)
Patient calling to inform Dr.Patel that he called the wound center in [place] and they told him there was no referral on file. Please resend fax [phone]
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
## 2021-01-16 ENCOUNTER — Other Ambulatory Visit: Payer: Self-pay

## 2021-01-16 ENCOUNTER — Ambulatory Visit (INDEPENDENT_AMBULATORY_CARE_PROVIDER_SITE_OTHER): Payer: 59 | Admitting: Podiatry

## 2021-01-16 ENCOUNTER — Telehealth: Payer: Self-pay | Admitting: *Deleted

## 2021-01-16 DIAGNOSIS — L97512 Non-pressure chronic ulcer of other part of right foot with fat layer exposed: Secondary | ICD-10-CM

## 2021-01-16 DIAGNOSIS — L97522 Non-pressure chronic ulcer of other part of left foot with fat layer exposed: Secondary | ICD-10-CM | POA: Diagnosis not present

## 2021-01-16 DIAGNOSIS — E1165 Type 2 diabetes mellitus with hyperglycemia: Secondary | ICD-10-CM | POA: Diagnosis not present

## 2021-01-16 NOTE — Telephone Encounter (Signed)
Faxed referral to [facility] wound healing center-01/16/21.  Called patient and informed that referral has been sent to wound center and they will contact him to schedule an appointment,verbalized understanding.

## 2021-01-17 ENCOUNTER — Encounter: Payer: Self-pay | Admitting: Podiatry
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
## 2021-01-18 ENCOUNTER — Other Ambulatory Visit: Payer: Self-pay | Admitting: "Endocrinology

## 2021-01-18 ENCOUNTER — Other Ambulatory Visit: Payer: Self-pay | Admitting: Family Medicine

## 2021-01-18 NOTE — Telephone Encounter (Signed)
[facility]. NTBS last DM ckup was in Dec. Mail order not sent

## 2021-01-24 ENCOUNTER — Encounter: Payer: Self-pay | Admitting: "Endocrinology

## 2021-01-24 ENCOUNTER — Other Ambulatory Visit: Payer: Self-pay

## 2021-01-24 ENCOUNTER — Ambulatory Visit (INDEPENDENT_AMBULATORY_CARE_PROVIDER_SITE_OTHER): Payer: 59 | Admitting: "Endocrinology

## 2021-01-24 VITALS — BP 136/80 | HR 76 | Ht 77.0 in | Wt 312.2 lb

## 2021-01-24 DIAGNOSIS — E782 Mixed hyperlipidemia: Secondary | ICD-10-CM

## 2021-01-24 DIAGNOSIS — E1165 Type 2 diabetes mellitus with hyperglycemia: Secondary | ICD-10-CM | POA: Diagnosis not present

## 2021-01-24 DIAGNOSIS — I10 Essential (primary) hypertension: Secondary | ICD-10-CM

## 2021-01-24 LAB — POCT GLYCOSYLATED HEMOGLOBIN (HGB A1C): HbA1c, POC (controlled diabetic range): 5.8 % (ref 0.0–7.0)
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
## 2021-01-24 NOTE — Patient Instructions (Signed)

## 2021-02-06 ENCOUNTER — Ambulatory Visit (INDEPENDENT_AMBULATORY_CARE_PROVIDER_SITE_OTHER): Payer: 59 | Admitting: Podiatry

## 2021-02-06 ENCOUNTER — Other Ambulatory Visit: Payer: Self-pay

## 2021-02-06 DIAGNOSIS — L97522 Non-pressure chronic ulcer of other part of left foot with fat layer exposed: Secondary | ICD-10-CM | POA: Diagnosis not present

## 2021-02-06 DIAGNOSIS — L97512 Non-pressure chronic ulcer of other part of right foot with fat layer exposed: Secondary | ICD-10-CM

## 2021-02-06 DIAGNOSIS — E1165 Type 2 diabetes mellitus with hyperglycemia: Secondary | ICD-10-CM
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
## 2021-02-07 DIAGNOSIS — F32A Depression, unspecified: Secondary | ICD-10-CM | POA: Insufficient documentation

## 2021-02-07 DIAGNOSIS — E11621 Type 2 diabetes mellitus with foot ulcer: Secondary | ICD-10-CM | POA: Insufficient documentation

## 2021-02-07 DIAGNOSIS — L98499 Non-pressure chronic ulcer of skin of other sites with unspecified severity: Secondary | ICD-10-CM | POA: Insufficient documentation

## 2021-02-07 DIAGNOSIS — M21372 Foot drop, left foot: Secondary | ICD-10-CM | POA: Insufficient documentation

## 2021-02-07 DIAGNOSIS — S8991XA Unspecified injury of right lower leg, initial encounter: Secondary | ICD-10-CM | POA: Insufficient documentation

## 2021-02-07 DIAGNOSIS — F419 Anxiety disorder, unspecified: Secondary | ICD-10-CM | POA: Insufficient documentation

## 2021-02-07 DIAGNOSIS — G4730 Sleep apnea, unspecified: Secondary | ICD-10-CM | POA: Insufficient documentation

## 2021-02-07 DIAGNOSIS — I351 Nonrheumatic aortic (valve) insufficiency: Secondary | ICD-10-CM | POA: Insufficient documentation

## 2021-02-07 DIAGNOSIS — M79673 Pain in unspecified foot: Secondary | ICD-10-CM | POA: Insufficient documentation

## 2021-02-07 DIAGNOSIS — N189 Chronic kidney disease, unspecified: Secondary | ICD-10-CM | POA: Insufficient documentation

## 2021-02-08 DIAGNOSIS — E11621 Type 2 diabetes mellitus with foot ulcer: Secondary | ICD-10-CM | POA: Diagnosis not present

## 2021-02-08 DIAGNOSIS — I10 Essential (primary) hypertension: Secondary | ICD-10-CM | POA: Diagnosis not present

## 2021-02-08 DIAGNOSIS — M19071 Primary osteoarthritis, right ankle and foot: Secondary | ICD-10-CM | POA: Diagnosis not present

## 2021-02-08 DIAGNOSIS — M19072 Primary osteoarthritis, left ankle and foot: Secondary | ICD-10-CM | POA: Diagnosis not present

## 2021-02-08 DIAGNOSIS — L97519 Non-pressure chronic ulcer of other part of right foot with unspecified severity: Secondary | ICD-10-CM | POA: Diagnosis not present

## 2021-02-08 DIAGNOSIS — L97409 Non-pressure chronic ulcer of unspecified heel and midfoot with unspecified severity: Secondary | ICD-10-CM | POA: Diagnosis not present

## 2021-02-08 DIAGNOSIS — Z7982 Long term (current) use of aspirin: Secondary | ICD-10-CM | POA: Diagnosis not present

## 2021-02-08 DIAGNOSIS — I890 Lymphedema, not elsewhere classified: Secondary | ICD-10-CM | POA: Diagnosis not present

## 2021-02-08 DIAGNOSIS — L84 Corns and callosities: Secondary | ICD-10-CM | POA: Diagnosis not present

## 2021-02-08 DIAGNOSIS — M79672 Pain in left foot: Secondary | ICD-10-CM | POA: Diagnosis not present

## 2021-02-08 DIAGNOSIS — E785 Hyperlipidemia, unspecified: Secondary | ICD-10-CM | POA: Diagnosis not present

## 2021-02-08 DIAGNOSIS — L97529 Non-pressure chronic ulcer of other part of left foot with unspecified severity: Secondary | ICD-10-CM | POA: Diagnosis not present

## 2021-02-08 DIAGNOSIS — E669 Obesity, unspecified: Secondary | ICD-10-CM | POA: Diagnosis not present

## 2021-02-08 DIAGNOSIS — E1140 Type 2 diabetes mellitus with diabetic neuropathy, unspecified: Secondary | ICD-10-CM | POA: Diagnosis not present

## 2021-02-15 ENCOUNTER — Other Ambulatory Visit: Payer: Self-pay | Admitting: Family Medicine

## 2021-02-15 DIAGNOSIS — E1159 Type 2 diabetes mellitus with other circulatory complications: Secondary | ICD-10-CM

## 2021-02-15 DIAGNOSIS — I152 Hypertension secondary to endocrine disorders: Secondary | ICD-10-CM

## 2021-02-15 DIAGNOSIS — F32A Depression, unspecified: Secondary | ICD-10-CM

## 2021-02-15 NOTE — Telephone Encounter (Signed)
[facility]. NTBS was to have 6 mos ckup in July. Mail order not sent

## 2021-02-19 ENCOUNTER — Ambulatory Visit (INDEPENDENT_AMBULATORY_CARE_PROVIDER_SITE_OTHER): Payer: 59

## 2021-02-19 VITALS — Ht 77.0 in | Wt 303.0 lb

## 2021-02-19 DIAGNOSIS — Z0000 Encounter for general adult medical examination without abnormal findings: Secondary | ICD-10-CM | POA: Diagnosis not present
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
## 2021-02-19 NOTE — Patient Instructions (Signed)
Anthony Logan , Thank you for taking time to come for your Medicare Wellness Visit. I appreciate your ongoing commitment to your health goals. Please review the following plan we discussed and let me know if I can assist you in the future.   Screening recommendations/referrals: Colonoscopy: Cologuard done through insurance last month - repeat every 3 years Recommended yearly ophthalmology/optometry visit for glaucoma screening and checkup Recommended yearly dental visit for hygiene and checkup  Vaccinations: Influenza vaccine: Done 04/23/2020 - Repeat annually *due Pneumococcal vaccine: Done 05/17/2018 & 06/24/2019 Tdap vaccine: Done 06/20/2011 - Repeat in 10 years Shingles vaccine: Due. Shingrix discussed. Please contact your pharmacy for coverage information.     Covid-19: Done 08/11/2019, 09/13/2019, & 05/16/2020  Advanced directives: Please bring a copy of your health care power of attorney and living will to the office to be added to your chart at your convenience.   Conditions/risks identified: Aim for 30 minutes of exercise or brisk walking each day, drink 6-8 glasses of water and eat lots of fruits and vegetables.   Next appointment: Follow up in one year for your annual wellness visit   Preventive Care 40-64 Years, Male Preventive care refers to lifestyle choices and visits with your health care provider that can promote health and wellness. What does preventive care include? A yearly physical exam. This is also called an annual well check. Dental exams once or twice a year. Routine eye exams. Ask your health care provider how often you should have your eyes checked. Personal lifestyle choices, including: Daily care of your teeth and gums. Regular physical activity. Eating a healthy diet. Avoiding tobacco and drug use. Limiting alcohol use. Practicing safe sex. Taking low-dose aspirin every day starting at age 69. What happens during an annual well check? The services and screenings  done by your health care provider during your annual well check will depend on your age, overall health, lifestyle risk factors, and family history of disease. Counseling  Your health care provider may ask you questions about your: Alcohol use. Tobacco use. Drug use. Emotional well-being. Home and relationship well-being. Sexual activity. Eating habits. Work and work Statistician. Screening  You may have the following tests or measurements: Height, weight, and BMI. Blood pressure. Lipid and cholesterol levels. These may be checked every 5 years, or more frequently if you are over 25 years old. Skin check. Lung cancer screening. You may have this screening every year starting at age 39 if you have a 30-pack-year history of smoking and currently smoke or have quit within the past 15 years. Fecal occult blood test (FOBT) of the stool. You may have this test every year starting at age 83. Flexible sigmoidoscopy or colonoscopy. You may have a sigmoidoscopy every 5 years or a colonoscopy every 10 years starting at age 77. Prostate cancer screening. Recommendations will vary depending on your family history and other risks. Hepatitis C blood test. Hepatitis B blood test. Sexually transmitted disease (STD) testing. Diabetes screening. This is done by checking your blood sugar (glucose) after you have not eaten for a while (fasting). You may have this done every 1-3 years. Discuss your test results, treatment options, and if necessary, the need for more tests with your health care provider. Vaccines  Your health care provider may recommend certain vaccines, such as: Influenza vaccine. This is recommended every year. Tetanus, diphtheria, and acellular pertussis (Tdap, Td) vaccine. You may need a Td booster every 10 years. Zoster vaccine. You may need this after age 12. Pneumococcal  13-valent conjugate (PCV13) vaccine. You may need this if you have certain conditions and have not been  vaccinated. Pneumococcal polysaccharide (PPSV23) vaccine. You may need one or two doses if you smoke cigarettes or if you have certain conditions. Talk to your health care provider about which screenings and vaccines you need and how often you need them. This information is not intended to replace advice given to you by your health care provider. Make sure you discuss any questions you have with your health care provider. Document Released: 06/01/2015 Document Revised: 01/23/2016 Document Reviewed: 03/06/2015 Elsevier Interactive Patient Education  2017 [employer] Prevention in the Home Falls can cause injuries. They can happen to people of all ages. There are many things you can do to make your home safe and to help prevent falls. What can I do on the outside of my home? Regularly fix the edges of walkways and driveways and fix any cracks. Remove anything that might make you trip as you walk through a door, such as a raised step or threshold. Trim any bushes or trees on the path to your home. Use bright outdoor lighting. Clear any walking paths of anything that might make someone trip, such as rocks or tools. Regularly check to see if handrails are loose or broken. Make sure that both sides of any steps have handrails. Any raised decks and porches should have guardrails on the edges. Have any leaves, snow, or ice cleared regularly. Use sand or salt on walking paths during winter. Clean up any spills in your garage right away. This includes oil or grease spills. What can I do in the bathroom? Use night lights. Install grab bars by the toilet and in the tub and shower. Do not use towel bars as grab bars. Use non-skid mats or decals in the tub or shower. If you need to sit down in the shower, use a plastic, non-slip stool. Keep the floor dry. Clean up any water that spills on the floor as soon as it happens. Remove soap buildup in the tub or shower regularly. Attach bath mats  securely with double-sided non-slip rug tape. Do not have throw rugs and other things on the floor that can make you trip. What can I do in the bedroom? Use night lights. Make sure that you have a light by your bed that is easy to reach. Do not use any sheets or blankets that are too big for your bed. They should not hang down onto the floor. Have a firm chair that has side arms. You can use this for support while you get dressed. Do not have throw rugs and other things on the floor that can make you trip. What can I do in the kitchen? Clean up any spills right away. Avoid walking on wet floors. Keep items that you use a lot in easy-to-reach places. If you need to reach something above you, use a strong step stool that has a grab bar. Keep electrical cords out of the way. Do not use floor polish or wax that makes floors slippery. If you must use wax, use non-skid floor wax. Do not have throw rugs and other things on the floor that can make you trip. What can I do with my stairs? Do not leave any items on the stairs. Make sure that there are handrails on both sides of the stairs and use them. Fix handrails that are broken or loose. Make sure that handrails are as long as the stairways.  Check any carpeting to make sure that it is firmly attached to the stairs. Fix any carpet that is loose or worn. Avoid having throw rugs at the top or bottom of the stairs. If you do have throw rugs, attach them to the floor with carpet tape. Make sure that you have a light switch at the top of the stairs and the bottom of the stairs. If you do not have them, ask someone to add them for you. What else can I do to help prevent falls? Wear shoes that: Do not have high heels. Have rubber bottoms. Are comfortable and fit you well. Are closed at the toe. Do not wear sandals. If you use a stepladder: Make sure that it is fully opened. Do not climb a closed stepladder. Make sure that both sides of the stepladder  are locked into place. Ask someone to hold it for you, if possible. Clearly mark and make sure that you can see: Any grab bars or handrails. First and last steps. Where the edge of each step is. Use tools that help you move around (mobility aids) if they are needed. These include: Canes. Walkers. Scooters. Crutches. Turn on the lights when you go into a dark area. Replace any light bulbs as soon as they burn out. Set up your furniture so you have a clear path. Avoid moving your furniture around. If any of your floors are uneven, fix them. If there are any pets around you, be aware of where they are. Review your medicines with your doctor. Some medicines can make you feel dizzy. This can increase your chance of falling. Ask your doctor what other things that you can do to help prevent falls. This information is not intended to replace advice given to you by your health care provider. Make sure you discuss any questions you have with your health care provider. Document Released: 03/01/2009 Document Revised: 10/11/2015 Document Reviewed: 06/09/2014 Elsevier Interactive Patient Education  2017 [employer].

## 2021-02-20 ENCOUNTER — Other Ambulatory Visit: Payer: Self-pay

## 2021-02-20 ENCOUNTER — Encounter: Payer: Self-pay | Admitting: Family Medicine

## 2021-02-20 ENCOUNTER — Ambulatory Visit (INDEPENDENT_AMBULATORY_CARE_PROVIDER_SITE_OTHER): Payer: 59 | Admitting: Family Medicine

## 2021-02-20 VITALS — BP 117/79 | HR 86 | Temp 97.5°F | Ht 77.0 in | Wt 301.2 lb

## 2021-02-20 DIAGNOSIS — E1169 Type 2 diabetes mellitus with other specified complication: Secondary | ICD-10-CM | POA: Diagnosis not present

## 2021-02-20 DIAGNOSIS — L97519 Non-pressure chronic ulcer of other part of right foot with unspecified severity: Secondary | ICD-10-CM

## 2021-02-20 DIAGNOSIS — F32A Depression, unspecified: Secondary | ICD-10-CM

## 2021-02-20 DIAGNOSIS — Z23 Encounter for immunization: Secondary | ICD-10-CM | POA: Diagnosis not present

## 2021-02-20 DIAGNOSIS — N183 Chronic kidney disease, stage 3 unspecified: Secondary | ICD-10-CM

## 2021-02-20 DIAGNOSIS — I152 Hypertension secondary to endocrine disorders: Secondary | ICD-10-CM | POA: Diagnosis not present

## 2021-02-20 DIAGNOSIS — E1122 Type 2 diabetes mellitus with diabetic chronic kidney disease: Secondary | ICD-10-CM

## 2021-02-20 DIAGNOSIS — E11621 Type 2 diabetes mellitus with foot ulcer: Secondary | ICD-10-CM

## 2021-02-20 DIAGNOSIS — E785 Hyperlipidemia, unspecified: Secondary | ICD-10-CM | POA: Diagnosis not present

## 2021-02-20 DIAGNOSIS — L97529 Non-pressure chronic ulcer of other part of left foot with unspecified severity: Secondary | ICD-10-CM

## 2021-02-20 DIAGNOSIS — E1159 Type 2 diabetes mellitus with other circulatory complications: Secondary | ICD-10-CM

## 2021-02-20 MED ORDER — HYDROCHLOROTHIAZIDE 25 MG PO TABS: 25.0000 mg | ORAL_TABLET | Freq: Every day | ORAL | 3 refills | Status: DC

## 2021-02-20 MED ORDER — METOPROLOL TARTRATE 100 MG PO TABS: 200.0000 mg | ORAL_TABLET | Freq: Two times a day (BID) | ORAL | 3 refills | Status: DC

## 2021-02-20 MED ORDER — FUROSEMIDE 20 MG PO TABS: 40.0000 mg | ORAL_TABLET | Freq: Every day | ORAL | 3 refills | Status: DC

## 2021-02-20 MED ORDER — LISINOPRIL 40 MG PO TABS: 40.0000 mg | ORAL_TABLET | Freq: Every day | ORAL | 3 refills | Status: DC

## 2021-02-20 MED ORDER — ATORVASTATIN CALCIUM 20 MG PO TABS: 20.0000 mg | ORAL_TABLET | Freq: Every day | ORAL | 3 refills | Status: DC

## 2021-02-20 MED ORDER — BUPROPION HCL ER (XL) 300 MG PO TB24: 300.0000 mg | ORAL_TABLET | Freq: Every day | ORAL | 3 refills | Status: DC

## 2021-02-20 MED ORDER — ALLOPURINOL 300 MG PO TABS: 300.0000 mg | ORAL_TABLET | Freq: Every day | ORAL | 3 refills | Status: DC
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
## 2021-02-21 DIAGNOSIS — M79604 Pain in right leg: Secondary | ICD-10-CM | POA: Diagnosis not present

## 2021-02-21 DIAGNOSIS — L97529 Non-pressure chronic ulcer of other part of left foot with unspecified severity: Secondary | ICD-10-CM | POA: Diagnosis not present

## 2021-02-21 DIAGNOSIS — M79606 Pain in leg, unspecified: Secondary | ICD-10-CM | POA: Diagnosis not present

## 2021-02-21 DIAGNOSIS — M7989 Other specified soft tissue disorders: Secondary | ICD-10-CM | POA: Diagnosis not present

## 2021-02-21 DIAGNOSIS — M79605 Pain in left leg: Secondary | ICD-10-CM | POA: Diagnosis not present

## 2021-02-21 DIAGNOSIS — L97519 Non-pressure chronic ulcer of other part of right foot with unspecified severity: Secondary | ICD-10-CM | POA: Diagnosis not present

## 2021-02-22 DIAGNOSIS — L97529 Non-pressure chronic ulcer of other part of left foot with unspecified severity: Secondary | ICD-10-CM | POA: Diagnosis not present

## 2021-02-22 DIAGNOSIS — E669 Obesity, unspecified: Secondary | ICD-10-CM | POA: Diagnosis not present

## 2021-02-22 DIAGNOSIS — E1140 Type 2 diabetes mellitus with diabetic neuropathy, unspecified: Secondary | ICD-10-CM | POA: Diagnosis not present

## 2021-02-22 DIAGNOSIS — Z7982 Long term (current) use of aspirin: Secondary | ICD-10-CM | POA: Diagnosis not present

## 2021-02-22 DIAGNOSIS — L84 Corns and callosities: Secondary | ICD-10-CM | POA: Diagnosis not present

## 2021-02-22 DIAGNOSIS — I10 Essential (primary) hypertension: Secondary | ICD-10-CM | POA: Diagnosis not present

## 2021-02-22 DIAGNOSIS — E785 Hyperlipidemia, unspecified: Secondary | ICD-10-CM | POA: Diagnosis not present

## 2021-02-22 DIAGNOSIS — L97519 Non-pressure chronic ulcer of other part of right foot with unspecified severity: Secondary | ICD-10-CM | POA: Diagnosis not present

## 2021-02-22 DIAGNOSIS — E11621 Type 2 diabetes mellitus with foot ulcer: Secondary | ICD-10-CM | POA: Diagnosis not present

## 2021-02-22 DIAGNOSIS — L97409 Non-pressure chronic ulcer of unspecified heel and midfoot with unspecified severity: Secondary | ICD-10-CM | POA: Diagnosis not present

## 2021-02-26 DIAGNOSIS — H524 Presbyopia: Secondary | ICD-10-CM | POA: Diagnosis not present

## 2021-02-26 DIAGNOSIS — E11319 Type 2 diabetes mellitus with unspecified diabetic retinopathy without macular edema: Secondary | ICD-10-CM | POA: Diagnosis not present

## 2021-03-01 DIAGNOSIS — E11621 Type 2 diabetes mellitus with foot ulcer: Secondary | ICD-10-CM | POA: Diagnosis not present

## 2021-03-01 DIAGNOSIS — E1140 Type 2 diabetes mellitus with diabetic neuropathy, unspecified: Secondary | ICD-10-CM | POA: Diagnosis not present

## 2021-03-01 DIAGNOSIS — L97519 Non-pressure chronic ulcer of other part of right foot with unspecified severity: Secondary | ICD-10-CM | POA: Diagnosis not present

## 2021-03-01 DIAGNOSIS — E669 Obesity, unspecified: Secondary | ICD-10-CM | POA: Diagnosis not present

## 2021-03-01 DIAGNOSIS — E785 Hyperlipidemia, unspecified: Secondary | ICD-10-CM | POA: Diagnosis not present

## 2021-03-01 DIAGNOSIS — L97522 Non-pressure chronic ulcer of other part of left foot with fat layer exposed: Secondary | ICD-10-CM | POA: Diagnosis not present

## 2021-03-01 DIAGNOSIS — I10 Essential (primary) hypertension: Secondary | ICD-10-CM | POA: Diagnosis not present

## 2021-03-01 DIAGNOSIS — L97529 Non-pressure chronic ulcer of other part of left foot with unspecified severity: Secondary | ICD-10-CM | POA: Diagnosis not present

## 2021-03-01 DIAGNOSIS — L97512 Non-pressure chronic ulcer of other part of right foot with fat layer exposed: Secondary | ICD-10-CM | POA: Diagnosis not present

## 2021-03-01 DIAGNOSIS — Z7982 Long term (current) use of aspirin: Secondary | ICD-10-CM | POA: Diagnosis not present

## 2021-03-01 DIAGNOSIS — L84 Corns and callosities: Secondary | ICD-10-CM | POA: Diagnosis not present

## 2021-03-08 DIAGNOSIS — I10 Essential (primary) hypertension: Secondary | ICD-10-CM | POA: Diagnosis not present

## 2021-03-08 DIAGNOSIS — E785 Hyperlipidemia, unspecified: Secondary | ICD-10-CM | POA: Diagnosis not present

## 2021-03-08 DIAGNOSIS — L97529 Non-pressure chronic ulcer of other part of left foot with unspecified severity: Secondary | ICD-10-CM | POA: Diagnosis not present

## 2021-03-08 DIAGNOSIS — L84 Corns and callosities: Secondary | ICD-10-CM | POA: Diagnosis not present

## 2021-03-08 DIAGNOSIS — Z7982 Long term (current) use of aspirin: Secondary | ICD-10-CM | POA: Diagnosis not present

## 2021-03-08 DIAGNOSIS — L97519 Non-pressure chronic ulcer of other part of right foot with unspecified severity: Secondary | ICD-10-CM | POA: Diagnosis not present

## 2021-03-08 DIAGNOSIS — E1140 Type 2 diabetes mellitus with diabetic neuropathy, unspecified: Secondary | ICD-10-CM | POA: Diagnosis not present

## 2021-03-08 DIAGNOSIS — E669 Obesity, unspecified: Secondary | ICD-10-CM | POA: Diagnosis not present

## 2021-03-08 DIAGNOSIS — E11621 Type 2 diabetes mellitus with foot ulcer: Secondary | ICD-10-CM | POA: Diagnosis not present

## 2021-03-22 DIAGNOSIS — E11621 Type 2 diabetes mellitus with foot ulcer: Secondary | ICD-10-CM | POA: Diagnosis not present

## 2021-03-22 DIAGNOSIS — L97519 Non-pressure chronic ulcer of other part of right foot with unspecified severity: Secondary | ICD-10-CM | POA: Diagnosis not present

## 2021-03-22 DIAGNOSIS — L97409 Non-pressure chronic ulcer of unspecified heel and midfoot with unspecified severity: Secondary | ICD-10-CM | POA: Diagnosis not present

## 2021-03-22 DIAGNOSIS — L97529 Non-pressure chronic ulcer of other part of left foot with unspecified severity: Secondary | ICD-10-CM | POA: Diagnosis not present

## 2021-03-22 DIAGNOSIS — E08621 Diabetes mellitus due to underlying condition with foot ulcer: Secondary | ICD-10-CM | POA: Diagnosis not present

## 2021-04-05 ENCOUNTER — Ambulatory Visit (INDEPENDENT_AMBULATORY_CARE_PROVIDER_SITE_OTHER): Payer: 59 | Admitting: Podiatry

## 2021-04-05 ENCOUNTER — Other Ambulatory Visit: Payer: Self-pay

## 2021-04-05 DIAGNOSIS — C641 Malignant neoplasm of right kidney, except renal pelvis: Secondary | ICD-10-CM

## 2021-04-05 DIAGNOSIS — L97522 Non-pressure chronic ulcer of other part of left foot with fat layer exposed: Secondary | ICD-10-CM | POA: Diagnosis not present

## 2021-04-05 DIAGNOSIS — L97512 Non-pressure chronic ulcer of other part of right foot with fat layer exposed: Secondary | ICD-10-CM | POA: Diagnosis not present

## 2021-04-05 DIAGNOSIS — E1165 Type 2 diabetes mellitus with hyperglycemia: Secondary | ICD-10-CM | POA: Diagnosis not present
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
## 2021-04-08 ENCOUNTER — Other Ambulatory Visit: Payer: 59

## 2021-04-08 DIAGNOSIS — E1165 Type 2 diabetes mellitus with hyperglycemia: Secondary | ICD-10-CM | POA: Diagnosis not present

## 2021-04-08 DIAGNOSIS — C641 Malignant neoplasm of right kidney, except renal pelvis: Secondary | ICD-10-CM | POA: Diagnosis not present

## 2021-04-09 LAB — COMPREHENSIVE METABOLIC PANEL
ALT: 24 IU/L (ref 0–44)
ALT: 24 IU/L (ref 0–44)
AST: 24 IU/L (ref 0–40)
AST: 24 IU/L (ref 0–40)
Albumin/Globulin Ratio: 1.7 (ref 1.2–2.2)
Albumin/Globulin Ratio: 1.8 (ref 1.2–2.2)
Albumin: 4.3 g/dL (ref 3.8–4.9)
Albumin: 4.4 g/dL (ref 3.8–4.9)
Alkaline Phosphatase: 67 IU/L (ref 44–121)
Alkaline Phosphatase: 69 IU/L (ref 44–121)
BUN/Creatinine Ratio: 15 (ref 10–24)
BUN/Creatinine Ratio: 16 (ref 10–24)
BUN: 20 mg/dL (ref 8–27)
BUN: 22 mg/dL (ref 8–27)
Bilirubin Total: 0.8 mg/dL (ref 0.0–1.2)
Bilirubin Total: 0.8 mg/dL (ref 0.0–1.2)
CO2: 27 mmol/L (ref 20–29)
CO2: 28 mmol/L (ref 20–29)
Calcium: 9.3 mg/dL (ref 8.6–10.2)
Calcium: 9.5 mg/dL (ref 8.6–10.2)
Chloride: 96 mmol/L (ref 96–106)
Chloride: 98 mmol/L (ref 96–106)
Creatinine, Ser: 1.34 mg/dL — ABNORMAL HIGH (ref 0.76–1.27)
Creatinine, Ser: 1.36 mg/dL — ABNORMAL HIGH (ref 0.76–1.27)
Globulin, Total: 2.5 g/dL (ref 1.5–4.5)
Globulin, Total: 2.6 g/dL (ref 1.5–4.5)
Glucose: 145 mg/dL — ABNORMAL HIGH (ref 70–99)
Glucose: 150 mg/dL — ABNORMAL HIGH (ref 70–99)
Potassium: 4 mmol/L (ref 3.5–5.2)
Potassium: 4.3 mmol/L (ref 3.5–5.2)
Sodium: 139 mmol/L (ref 134–144)
Sodium: 140 mmol/L (ref 134–144)
Total Protein: 6.9 g/dL (ref 6.0–8.5)
Total Protein: 6.9 g/dL (ref 6.0–8.5)
eGFR: 60 mL/min/{1.73_m2} (ref >59)
eGFR: 61 mL/min/{1.73_m2} (ref >59)

## 2021-04-09 LAB — T4, FREE: Free T4: 1.22 ng/dL (ref 0.82–1.77)

## 2021-04-09 LAB — TSH: TSH: 0.713 u[IU]/mL (ref 0.450–4.500)

## 2021-04-17 ENCOUNTER — Ambulatory Visit (HOSPITAL_COMMUNITY)
Admission: RE | Admit: 2021-04-17 | Discharge: 2021-04-17 | Disposition: A | Discharge status: Home / Self Care | Payer: 59 | Source: Ambulatory Visit | Attending: Urology | Admitting: Urology

## 2021-04-17 ENCOUNTER — Ambulatory Visit (INDEPENDENT_AMBULATORY_CARE_PROVIDER_SITE_OTHER): Payer: 59 | Admitting: Urology

## 2021-04-17 ENCOUNTER — Encounter: Payer: Self-pay | Admitting: Urology

## 2021-04-17 ENCOUNTER — Other Ambulatory Visit: Payer: Self-pay

## 2021-04-17 VITALS — BP 132/79 | HR 76

## 2021-04-17 DIAGNOSIS — N5201 Erectile dysfunction due to arterial insufficiency: Secondary | ICD-10-CM | POA: Diagnosis not present

## 2021-04-17 DIAGNOSIS — C641 Malignant neoplasm of right kidney, except renal pelvis: Secondary | ICD-10-CM

## 2021-04-17 MED ORDER — SILDENAFIL CITRATE 100 MG PO TABS: 100.0000 mg | ORAL_TABLET | Freq: Every day | ORAL | 0 refills | Status: DC | PRN

## 2021-04-17 NOTE — Progress Notes (Signed)
Urological Symptom Review  Patient is experiencing the following symptoms: Weak stream   Review of Systems  Gastrointestinal (upper)  : Negative for upper GI symptoms  Gastrointestinal (lower) : Negative for lower GI symptoms  Constitutional : Negative for symptoms  Skin: Negative for skin symptoms  Eyes: Negative for eye symptoms  Ear/Nose/Throat : Negative for Ear/Nose/Throat symptoms  Hematologic/Lymphatic: Negative for Hematologic/Lymphatic symptoms  Cardiovascular : Negative for cardiovascular symptoms  Respiratory : Negative for respiratory symptoms  Endocrine: Negative for endocrine symptoms  Musculoskeletal: Back pain  Neurological: Negative for neurological symptoms  Psychologic: Depression Anxiety
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
## 2021-04-17 NOTE — Patient Instructions (Signed)
Erectile Dysfunction °Erectile dysfunction (ED) is the inability to get or keep an erection in order to have sexual intercourse. ED is considered a symptom of an underlying disorder and is not considered a disease. ED may include: °Inability to get an erection. °Lack of enough hardness of the erection to allow penetration. °Loss of erection before sex is finished. °What are the causes? °This condition may be caused by: °Physical causes, such as: °Artery problems. This may include heart disease, high blood pressure, atherosclerosis, and diabetes. °Hormonal problems, such as low testosterone. °Obesity. °Nerve problems. This may include back or pelvic injuries, multiple sclerosis, Parkinson's disease, spinal cord injury, and stroke. °Certain medicines, such as: °Pain relievers. °Antidepressants. °Blood pressure medicines and water pills (diuretics). °Cancer medicines. °Antihistamines. °Muscle relaxants. °Lifestyle factors, such as: °Use of drugs such as marijuana, cocaine, or opioids. °Excessive use of alcohol. °Smoking. °Lack of physical activity or exercise. °Psychological causes, such as: °Anxiety or stress. °Sadness or depression. °Exhaustion. °Fear about sexual performance. °Guilt. °What are the signs or symptoms? °Symptoms of this condition include: °Inability to get an erection. °Lack of enough hardness of the erection to allow penetration. °Loss of the erection before sex is finished. °Sometimes having normal erections, but with frequent unsatisfactory episodes. °Low sexual satisfaction in either partner due to erection problems. °A curved penis occurring with erection. The curve may cause pain, or the penis may be too curved to allow for intercourse. °Never having nighttime or morning erections. °How is this diagnosed? °This condition is often diagnosed by: °Performing a physical exam to find other diseases or specific problems with the penis. °Asking you detailed questions about the problem. °Doing tests,  such as: °Blood tests to check for diabetes mellitus or high cholesterol, or to measure hormone levels. °Other tests to check for underlying health conditions. °An ultrasound exam to check for scarring. °A test to check blood flow to the penis. °Doing a sleep study at home to measure nighttime erections. °How is this treated? °This condition may be treated by: °Medicines, such as: °Medicine taken by mouth to help you achieve an erection (oral medicine). °Hormone replacement therapy to replace low testosterone levels. °Medicine that is injected into the penis. Your health care provider may instruct you how to give yourself these injections at home. °Medicine that is delivered with a short applicator tube. The tube is inserted into the opening at the tip of the penis, which is the opening of the urethra. A tiny pellet of medicine is put in the urethra. The pellet dissolves and enhances erectile function. This is also called MUSE (medicated urethral system for erections) therapy. °Vacuum pump. This is a pump with a ring on it. The pump and ring are placed on the penis and used to create pressure that helps the penis become erect. °Penile implant surgery. In this procedure, you may receive: °An inflatable implant. This consists of cylinders, a pump, and a reservoir. The cylinders can be inflated with a fluid that helps to create an erection, and they can be deflated after intercourse. °A semi-rigid implant. This consists of two silicone rubber rods. The rods provide some rigidity. They are also flexible, so the penis can both curve downward in its normal position and become straight for sexual intercourse. °Blood vessel surgery to improve blood flow to the penis. During this procedure, a blood vessel from a different part of the body is placed into the penis to allow blood to flow around (bypass) damaged or blocked blood vessels. °Lifestyle changes,   such as exercising more, losing weight, and quitting smoking. °Follow  these instructions at home: °Medicines ° °Take over-the-counter and prescription medicines only as told by your health care provider. Do not increase the dosage without first discussing it with your health care provider. °If you are using self-injections, do injections as directed by your health care provider. Make sure you avoid any veins that are on the surface of the penis. After giving an injection, apply pressure to the injection site for 5 minutes. °Talk to your health care provider about how to prevent headaches while taking ED medicines. These medicines may cause a sudden headache due to the increase in blood flow in your body. °General instructions °Exercise regularly, as directed by your health care provider. Work with your health care provider to lose weight, if needed. °Do not use any products that contain nicotine or tobacco. These products include cigarettes, chewing tobacco, and vaping devices, such as e-cigarettes. If you need help quitting, ask your health care provider. °Before using a vacuum pump, read the instructions that come with the pump and discuss any questions with your health care provider. °Keep all follow-up visits. This is important. °Contact a health care provider if: °You feel nauseous. °You are vomiting. °You get sudden headaches while taking ED medicines. °You have any concerns about your sexual health. °Get help right away if: °You are taking oral or injectable medicines and you have an erection that lasts longer than 4 hours. If your health care provider is unavailable, go to the nearest emergency room for evaluation. An erection that lasts much longer than 4 hours can result in permanent damage to your penis. °You have severe pain in your groin or abdomen. °You develop redness or severe swelling of your penis. °You have redness spreading at your groin or lower abdomen. °You are unable to urinate. °You experience chest pain or a rapid heartbeat (palpitations) after taking oral  medicines. °These symptoms may represent a serious problem that is an emergency. Do not wait to see if the symptoms will go away. Get medical help right away. Call your local emergency services (911 in the [place] Do not drive yourself to the hospital. °Summary °Erectile dysfunction (ED) is the inability to get or keep an erection during sexual intercourse. °This condition is diagnosed based on a physical exam, your symptoms, and tests to determine the cause. Treatment varies depending on the cause and may include medicines, hormone therapy, surgery, or a vacuum pump. °You may need follow-up visits to make sure that you are using your medicines or devices correctly. °Get help right away if you are taking or injecting medicines and you have an erection that lasts longer than 4 hours. °This information is not intended to replace advice given to you by your health care provider. Make sure you discuss any questions you have with your health care provider. °Document Revised: 08/01/2020 Document Reviewed: 08/01/2020 °Elsevier Patient Education © 2022 [employer] ° °

## 2021-04-18 ENCOUNTER — Ambulatory Visit: Payer: 59 | Admitting: Urology

## 2021-05-08 ENCOUNTER — Other Ambulatory Visit: Payer: Self-pay | Admitting: Family Medicine

## 2021-05-08 DIAGNOSIS — E1142 Type 2 diabetes mellitus with diabetic polyneuropathy: Secondary | ICD-10-CM

## 2021-06-05 ENCOUNTER — Other Ambulatory Visit: Payer: Self-pay | Admitting: "Endocrinology

## 2021-07-12 ENCOUNTER — Ambulatory Visit: Payer: 59 | Admitting: Podiatry

## 2021-07-16 ENCOUNTER — Telehealth: Payer: Self-pay | Admitting: "Endocrinology

## 2021-07-16 NOTE — Telephone Encounter (Signed)
Pt has appt next week, he did his labs in November, does he need to repeat labs?

## 2021-07-17 ENCOUNTER — Other Ambulatory Visit: Payer: Self-pay

## 2021-07-17 ENCOUNTER — Telehealth: Payer: Self-pay | Admitting: *Deleted

## 2021-07-17 ENCOUNTER — Ambulatory Visit (INDEPENDENT_AMBULATORY_CARE_PROVIDER_SITE_OTHER): Payer: 59 | Admitting: Podiatry

## 2021-07-17 DIAGNOSIS — E1165 Type 2 diabetes mellitus with hyperglycemia: Secondary | ICD-10-CM | POA: Diagnosis not present

## 2021-07-17 DIAGNOSIS — L97512 Non-pressure chronic ulcer of other part of right foot with fat layer exposed: Secondary | ICD-10-CM

## 2021-07-17 DIAGNOSIS — L97522 Non-pressure chronic ulcer of other part of left foot with fat layer exposed: Secondary | ICD-10-CM

## 2021-07-17 MED ORDER — DOXYCYCLINE HYCLATE 100 MG PO TABS: 100.0000 mg | ORAL_TABLET | Freq: Two times a day (BID) | ORAL | 0 refills | Status: AC

## 2021-07-17 NOTE — Telephone Encounter (Signed)
Where did you want this patient to be sent for wound referral?Please advise. ?

## 2021-07-17 NOTE — Telephone Encounter (Signed)
-----   Message from Felipa Furnace, DPM sent at 07/17/2021  3:37 PM EST ----- ?Regarding: Referral to the wound care center ?Hi Thurza Kwiecinski, ? ?Can you send this patient over the wound care center again.  He was discharged because he missed one of the appointments. ? ?Thank you ? ?
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
## 2021-07-17 NOTE — Telephone Encounter (Signed)
Pt stated that is where he has been going to. ?

## 2021-07-17 NOTE — Telephone Encounter (Signed)
Pt returned call and he would like to go to the wound clinic in [place] at [facility] ?

## 2021-07-17 NOTE — Telephone Encounter (Signed)
-----   Message from Anthony Logan, DPM sent at 07/17/2021  3:37 PM EST ----- ?Regarding: Referral to the wound care center ?Hi Dartanian Knaggs, ? ?Can you send this patient over the wound care center again.  He was discharged because he missed one of the appointments. ? ?Thank you ? ?

## 2021-07-18 ENCOUNTER — Telehealth: Payer: Self-pay | Admitting: *Deleted

## 2021-07-18 NOTE — Telephone Encounter (Signed)
Faxed referral for wound care per patient's request to [facility] wound care, received confirmation-07/18/21. ?

## 2021-07-19 ENCOUNTER — Telehealth: Payer: Self-pay | Admitting: *Deleted

## 2021-07-19 NOTE — Telephone Encounter (Signed)
error 

## 2021-07-22 ENCOUNTER — Other Ambulatory Visit: Payer: Self-pay | Admitting: "Endocrinology

## 2021-07-24 ENCOUNTER — Encounter: Payer: Self-pay | Admitting: "Endocrinology

## 2021-07-24 ENCOUNTER — Ambulatory Visit (INDEPENDENT_AMBULATORY_CARE_PROVIDER_SITE_OTHER): Payer: 59 | Admitting: "Endocrinology

## 2021-07-24 ENCOUNTER — Other Ambulatory Visit: Payer: Self-pay

## 2021-07-24 ENCOUNTER — Other Ambulatory Visit: Payer: Self-pay | Admitting: Cardiology

## 2021-07-24 ENCOUNTER — Other Ambulatory Visit: Payer: Self-pay | Admitting: Family Medicine

## 2021-07-24 VITALS — BP 130/84 | HR 76 | Ht 77.0 in | Wt 310.2 lb

## 2021-07-24 DIAGNOSIS — Z6836 Body mass index (BMI) 36.0-36.9, adult: Secondary | ICD-10-CM

## 2021-07-24 DIAGNOSIS — E1165 Type 2 diabetes mellitus with hyperglycemia: Secondary | ICD-10-CM | POA: Diagnosis not present

## 2021-07-24 DIAGNOSIS — I10 Essential (primary) hypertension: Secondary | ICD-10-CM | POA: Diagnosis not present

## 2021-07-24 DIAGNOSIS — E1142 Type 2 diabetes mellitus with diabetic polyneuropathy: Secondary | ICD-10-CM

## 2021-07-24 DIAGNOSIS — E782 Mixed hyperlipidemia: Secondary | ICD-10-CM

## 2021-07-24 LAB — POCT GLYCOSYLATED HEMOGLOBIN (HGB A1C): HbA1c, POC (controlled diabetic range): 6.2 % (ref 0.0–7.0)

## 2021-07-24 MED ORDER — SEMAGLUTIDE (2 MG/DOSE) 8 MG/3ML ~~LOC~~ SOPN: 2.0000 mg | PEN_INJECTOR | SUBCUTANEOUS | 3 refills | Status: DC
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
## 2021-07-24 NOTE — Patient Instructions (Signed)

## 2021-07-30 ENCOUNTER — Ambulatory Visit: Payer: 59 | Admitting: Family Medicine

## 2021-08-06 ENCOUNTER — Encounter: Payer: Self-pay | Admitting: Family Medicine

## 2021-08-06 ENCOUNTER — Ambulatory Visit (INDEPENDENT_AMBULATORY_CARE_PROVIDER_SITE_OTHER): Payer: 59 | Admitting: Family Medicine

## 2021-08-06 VITALS — BP 133/76 | HR 87 | Temp 97.2°F | Resp 20 | Ht 77.0 in | Wt 311.0 lb

## 2021-08-06 DIAGNOSIS — E1159 Type 2 diabetes mellitus with other circulatory complications: Secondary | ICD-10-CM

## 2021-08-06 DIAGNOSIS — C641 Malignant neoplasm of right kidney, except renal pelvis: Secondary | ICD-10-CM | POA: Diagnosis not present

## 2021-08-06 DIAGNOSIS — E1122 Type 2 diabetes mellitus with diabetic chronic kidney disease: Secondary | ICD-10-CM

## 2021-08-06 DIAGNOSIS — E1142 Type 2 diabetes mellitus with diabetic polyneuropathy: Secondary | ICD-10-CM | POA: Diagnosis not present

## 2021-08-06 DIAGNOSIS — L97519 Non-pressure chronic ulcer of other part of right foot with unspecified severity: Secondary | ICD-10-CM

## 2021-08-06 DIAGNOSIS — N183 Chronic kidney disease, stage 3 unspecified: Secondary | ICD-10-CM

## 2021-08-06 DIAGNOSIS — E1169 Type 2 diabetes mellitus with other specified complication: Secondary | ICD-10-CM

## 2021-08-06 DIAGNOSIS — Z7409 Other reduced mobility: Secondary | ICD-10-CM | POA: Diagnosis not present

## 2021-08-06 DIAGNOSIS — E1165 Type 2 diabetes mellitus with hyperglycemia: Secondary | ICD-10-CM | POA: Diagnosis not present

## 2021-08-06 DIAGNOSIS — L97529 Non-pressure chronic ulcer of other part of left foot with unspecified severity: Secondary | ICD-10-CM

## 2021-08-06 DIAGNOSIS — E785 Hyperlipidemia, unspecified: Secondary | ICD-10-CM

## 2021-08-06 DIAGNOSIS — I152 Hypertension secondary to endocrine disorders: Secondary | ICD-10-CM

## 2021-08-06 DIAGNOSIS — Z1211 Encounter for screening for malignant neoplasm of colon: Secondary | ICD-10-CM | POA: Diagnosis not present

## 2021-08-06 DIAGNOSIS — E782 Mixed hyperlipidemia: Secondary | ICD-10-CM | POA: Diagnosis not present

## 2021-08-06 DIAGNOSIS — E11621 Type 2 diabetes mellitus with foot ulcer: Secondary | ICD-10-CM

## 2021-08-06 DIAGNOSIS — I700 Atherosclerosis of aorta: Secondary | ICD-10-CM

## 2021-08-06 DIAGNOSIS — Z23 Encounter for immunization: Secondary | ICD-10-CM

## 2021-08-06 LAB — CBC
Hematocrit: 44 % (ref 37.5–51.0)
Hemoglobin: 15.4 g/dL (ref 13.0–17.7)
MCH: 32.7 pg (ref 26.6–33.0)
MCHC: 35 g/dL (ref 31.5–35.7)
MCV: 93 fL (ref 79–97)
Platelets: 220 10*3/uL (ref 150–450)
RBC: 4.71 x10E6/uL (ref 4.14–5.80)
RDW: 12.5 % (ref 11.6–15.4)
WBC: 6.7 10*3/uL (ref 3.4–10.8)

## 2021-08-06 MED ORDER — ALPHA-LIPOIC ACID 600 MG PO CAPS: 600.0000 mg | ORAL_CAPSULE | Freq: Every day | ORAL | 3 refills | Status: DC

## 2021-08-06 MED ORDER — GABAPENTIN 600 MG PO TABS: 600.0000 mg | ORAL_TABLET | Freq: Three times a day (TID) | ORAL | 3 refills | Status: DC
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
## 2021-08-06 NOTE — Patient Instructions (Addendum)
You are scheduled for a free diabetic eye exam HERE at [facility] on 08/29/2021 at 10:30am ? ?I have reordered the Cologuard.  Let me know if you do not get it in the next week or so. ?

## 2021-08-07 ENCOUNTER — Ambulatory Visit (INDEPENDENT_AMBULATORY_CARE_PROVIDER_SITE_OTHER): Payer: 59 | Admitting: Podiatry

## 2021-08-07 ENCOUNTER — Telehealth: Payer: Self-pay | Admitting: *Deleted

## 2021-08-07 ENCOUNTER — Other Ambulatory Visit: Payer: Self-pay

## 2021-08-07 DIAGNOSIS — L97522 Non-pressure chronic ulcer of other part of left foot with fat layer exposed: Secondary | ICD-10-CM

## 2021-08-07 DIAGNOSIS — L97512 Non-pressure chronic ulcer of other part of right foot with fat layer exposed: Secondary | ICD-10-CM | POA: Diagnosis not present

## 2021-08-07 DIAGNOSIS — E1165 Type 2 diabetes mellitus with hyperglycemia: Secondary | ICD-10-CM

## 2021-08-07 LAB — T4, FREE: Free T4: 1.23 ng/dL (ref 0.82–1.77)

## 2021-08-07 LAB — LIPID PANEL
Chol/HDL Ratio: 3.8 ratio (ref 0.0–5.0)
Cholesterol, Total: 114 mg/dL (ref 100–199)
HDL: 30 mg/dL — ABNORMAL LOW (ref >39)
LDL Chol Calc (NIH): 30 mg/dL (ref 0–99)
Triglycerides: 380 mg/dL — ABNORMAL HIGH (ref 0–149)
VLDL Cholesterol Cal: 54 mg/dL — ABNORMAL HIGH (ref 5–40)

## 2021-08-07 LAB — COMPREHENSIVE METABOLIC PANEL
ALT: 32 IU/L (ref 0–44)
AST: 30 IU/L (ref 0–40)
Albumin/Globulin Ratio: 1.7 (ref 1.2–2.2)
Albumin: 4.5 g/dL (ref 3.8–4.9)
Alkaline Phosphatase: 84 IU/L (ref 44–121)
BUN/Creatinine Ratio: 17 (ref 10–24)
BUN: 23 mg/dL (ref 8–27)
Bilirubin Total: 0.7 mg/dL (ref 0.0–1.2)
CO2: 26 mmol/L (ref 20–29)
Calcium: 9.8 mg/dL (ref 8.6–10.2)
Chloride: 97 mmol/L (ref 96–106)
Creatinine, Ser: 1.39 mg/dL — ABNORMAL HIGH (ref 0.76–1.27)
Globulin, Total: 2.6 g/dL (ref 1.5–4.5)
Glucose: 199 mg/dL — ABNORMAL HIGH (ref 70–99)
Potassium: 3.6 mmol/L (ref 3.5–5.2)
Sodium: 140 mmol/L (ref 134–144)
Total Protein: 7.1 g/dL (ref 6.0–8.5)
eGFR: 58 mL/min/{1.73_m2} — ABNORMAL LOW (ref >59)

## 2021-08-07 LAB — RENAL FUNCTION PANEL
Albumin: 4.4 g/dL (ref 3.8–4.9)
BUN/Creatinine Ratio: 17 (ref 10–24)
BUN: 24 mg/dL (ref 8–27)
CO2: 25 mmol/L (ref 20–29)
Calcium: 9.9 mg/dL (ref 8.6–10.2)
Chloride: 98 mmol/L (ref 96–106)
Creatinine, Ser: 1.41 mg/dL — ABNORMAL HIGH (ref 0.76–1.27)
Glucose: 203 mg/dL — ABNORMAL HIGH (ref 70–99)
Phosphorus: 2.8 mg/dL (ref 2.8–4.1)
Potassium: 3.9 mmol/L (ref 3.5–5.2)
Sodium: 141 mmol/L (ref 134–144)
eGFR: 57 mL/min/{1.73_m2} — ABNORMAL LOW (ref >59)

## 2021-08-07 LAB — TSH: TSH: 0.67 u[IU]/mL (ref 0.450–4.500)

## 2021-08-07 LAB — URIC ACID: Uric Acid: 6.7 mg/dL (ref 3.8–8.4)
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
## 2021-08-21 LAB — COLOGUARD: COLOGUARD: NEGATIVE

## 2021-09-03 DIAGNOSIS — L97519 Non-pressure chronic ulcer of other part of right foot with unspecified severity: Secondary | ICD-10-CM | POA: Diagnosis not present

## 2021-09-03 DIAGNOSIS — L97529 Non-pressure chronic ulcer of other part of left foot with unspecified severity: Secondary | ICD-10-CM | POA: Diagnosis not present

## 2021-09-03 DIAGNOSIS — Z79899 Other long term (current) drug therapy: Secondary | ICD-10-CM | POA: Diagnosis not present

## 2021-09-03 DIAGNOSIS — E11621 Type 2 diabetes mellitus with foot ulcer: Secondary | ICD-10-CM | POA: Diagnosis not present

## 2021-09-03 DIAGNOSIS — I10 Essential (primary) hypertension: Secondary | ICD-10-CM | POA: Diagnosis not present

## 2021-09-03 DIAGNOSIS — E1140 Type 2 diabetes mellitus with diabetic neuropathy, unspecified: Secondary | ICD-10-CM | POA: Diagnosis not present

## 2021-09-03 DIAGNOSIS — Z7984 Long term (current) use of oral hypoglycemic drugs: Secondary | ICD-10-CM | POA: Diagnosis not present

## 2021-09-03 DIAGNOSIS — L97501 Non-pressure chronic ulcer of other part of unspecified foot limited to breakdown of skin: Secondary | ICD-10-CM | POA: Diagnosis not present

## 2021-09-03 DIAGNOSIS — E785 Hyperlipidemia, unspecified: Secondary | ICD-10-CM | POA: Diagnosis not present

## 2021-09-17 DIAGNOSIS — L97529 Non-pressure chronic ulcer of other part of left foot with unspecified severity: Secondary | ICD-10-CM | POA: Diagnosis not present

## 2021-09-17 DIAGNOSIS — E08621 Diabetes mellitus due to underlying condition with foot ulcer: Secondary | ICD-10-CM | POA: Diagnosis not present

## 2021-09-17 DIAGNOSIS — L97501 Non-pressure chronic ulcer of other part of unspecified foot limited to breakdown of skin: Secondary | ICD-10-CM | POA: Diagnosis not present

## 2021-09-17 DIAGNOSIS — L97519 Non-pressure chronic ulcer of other part of right foot with unspecified severity: Secondary | ICD-10-CM | POA: Diagnosis not present

## 2021-09-17 NOTE — Telephone Encounter (Signed)
error 

## 2021-09-26 ENCOUNTER — Other Ambulatory Visit: Payer: Self-pay

## 2021-09-26 ENCOUNTER — Encounter: Payer: Self-pay | Admitting: Family Medicine

## 2021-09-26 DIAGNOSIS — E1142 Type 2 diabetes mellitus with diabetic polyneuropathy: Secondary | ICD-10-CM

## 2021-09-26 LAB — HM DIABETES EYE EXAM

## 2021-09-26 MED ORDER — ALPHA-LIPOIC ACID 600 MG PO CAPS: 600.0000 mg | ORAL_CAPSULE | Freq: Every day | ORAL | 1 refills | Status: AC

## 2021-10-01 DIAGNOSIS — L97529 Non-pressure chronic ulcer of other part of left foot with unspecified severity: Secondary | ICD-10-CM | POA: Diagnosis not present

## 2021-10-01 DIAGNOSIS — E08621 Diabetes mellitus due to underlying condition with foot ulcer: Secondary | ICD-10-CM | POA: Diagnosis not present

## 2021-10-01 DIAGNOSIS — L97519 Non-pressure chronic ulcer of other part of right foot with unspecified severity: Secondary | ICD-10-CM | POA: Diagnosis not present

## 2021-10-01 DIAGNOSIS — L97501 Non-pressure chronic ulcer of other part of unspecified foot limited to breakdown of skin: Secondary | ICD-10-CM | POA: Diagnosis not present

## 2021-10-15 DIAGNOSIS — L97512 Non-pressure chronic ulcer of other part of right foot with fat layer exposed: Secondary | ICD-10-CM | POA: Diagnosis not present

## 2021-10-15 DIAGNOSIS — L97511 Non-pressure chronic ulcer of other part of right foot limited to breakdown of skin: Secondary | ICD-10-CM | POA: Diagnosis not present

## 2021-10-15 DIAGNOSIS — L97519 Non-pressure chronic ulcer of other part of right foot with unspecified severity: Secondary | ICD-10-CM | POA: Diagnosis not present

## 2021-10-15 DIAGNOSIS — E08621 Diabetes mellitus due to underlying condition with foot ulcer: Secondary | ICD-10-CM | POA: Diagnosis not present

## 2021-10-15 DIAGNOSIS — L97529 Non-pressure chronic ulcer of other part of left foot with unspecified severity: Secondary | ICD-10-CM | POA: Diagnosis not present

## 2021-10-15 DIAGNOSIS — L97501 Non-pressure chronic ulcer of other part of unspecified foot limited to breakdown of skin: Secondary | ICD-10-CM | POA: Diagnosis not present

## 2021-10-15 DIAGNOSIS — E11621 Type 2 diabetes mellitus with foot ulcer: Secondary | ICD-10-CM | POA: Diagnosis not present

## 2021-10-21 ENCOUNTER — Other Ambulatory Visit: Payer: Self-pay | Admitting: Family Medicine

## 2021-10-21 DIAGNOSIS — E1142 Type 2 diabetes mellitus with diabetic polyneuropathy: Secondary | ICD-10-CM

## 2021-10-28 NOTE — Addendum Note (Signed)
Encounter addended by: Annie Paras on: 10/28/2021 12:44 PM  Actions taken: Letter saved

## 2021-11-01 ENCOUNTER — Other Ambulatory Visit: Payer: Self-pay | Admitting: Emergency Medicine

## 2021-11-01 DIAGNOSIS — E1140 Type 2 diabetes mellitus with diabetic neuropathy, unspecified: Secondary | ICD-10-CM | POA: Diagnosis not present

## 2021-11-01 DIAGNOSIS — L97529 Non-pressure chronic ulcer of other part of left foot with unspecified severity: Secondary | ICD-10-CM | POA: Diagnosis not present

## 2021-11-01 DIAGNOSIS — L97519 Non-pressure chronic ulcer of other part of right foot with unspecified severity: Secondary | ICD-10-CM | POA: Diagnosis not present

## 2021-11-01 DIAGNOSIS — E1142 Type 2 diabetes mellitus with diabetic polyneuropathy: Secondary | ICD-10-CM

## 2021-11-01 DIAGNOSIS — Z7982 Long term (current) use of aspirin: Secondary | ICD-10-CM | POA: Diagnosis not present

## 2021-11-01 DIAGNOSIS — L97521 Non-pressure chronic ulcer of other part of left foot limited to breakdown of skin: Secondary | ICD-10-CM | POA: Diagnosis not present

## 2021-11-01 DIAGNOSIS — L853 Xerosis cutis: Secondary | ICD-10-CM | POA: Diagnosis not present

## 2021-11-01 DIAGNOSIS — E785 Hyperlipidemia, unspecified: Secondary | ICD-10-CM | POA: Diagnosis not present

## 2021-11-01 DIAGNOSIS — M21372 Foot drop, left foot: Secondary | ICD-10-CM | POA: Diagnosis not present

## 2021-11-01 DIAGNOSIS — L84 Corns and callosities: Secondary | ICD-10-CM | POA: Diagnosis not present

## 2021-11-01 DIAGNOSIS — E11621 Type 2 diabetes mellitus with foot ulcer: Secondary | ICD-10-CM | POA: Diagnosis not present

## 2021-11-01 DIAGNOSIS — I10 Essential (primary) hypertension: Secondary | ICD-10-CM | POA: Diagnosis not present

## 2021-11-04 MED ORDER — GABAPENTIN 600 MG PO TABS: 600.0000 mg | ORAL_TABLET | Freq: Three times a day (TID) | ORAL | 3 refills | Status: DC

## 2021-11-06 ENCOUNTER — Ambulatory Visit (INDEPENDENT_AMBULATORY_CARE_PROVIDER_SITE_OTHER): Payer: 59 | Admitting: Podiatry

## 2021-11-06 ENCOUNTER — Encounter: Payer: Self-pay | Admitting: Podiatry

## 2021-11-06 DIAGNOSIS — L97522 Non-pressure chronic ulcer of other part of left foot with fat layer exposed: Secondary | ICD-10-CM | POA: Diagnosis not present

## 2021-11-06 DIAGNOSIS — E1165 Type 2 diabetes mellitus with hyperglycemia: Secondary | ICD-10-CM | POA: Diagnosis not present

## 2021-11-06 DIAGNOSIS — L97512 Non-pressure chronic ulcer of other part of right foot with fat layer exposed: Secondary | ICD-10-CM | POA: Diagnosis not present
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
## 2021-11-13 ENCOUNTER — Ambulatory Visit (INDEPENDENT_AMBULATORY_CARE_PROVIDER_SITE_OTHER): Payer: 59 | Admitting: Cardiology

## 2021-11-13 ENCOUNTER — Encounter: Payer: Self-pay | Admitting: Cardiology

## 2021-11-13 VITALS — BP 110/74 | HR 84 | Ht 77.0 in | Wt 303.2 lb

## 2021-11-13 DIAGNOSIS — I712 Thoracic aortic aneurysm, without rupture, unspecified: Secondary | ICD-10-CM | POA: Diagnosis not present

## 2021-11-13 DIAGNOSIS — I10 Essential (primary) hypertension: Secondary | ICD-10-CM

## 2021-11-13 DIAGNOSIS — I351 Nonrheumatic aortic (valve) insufficiency: Secondary | ICD-10-CM | POA: Diagnosis not present

## 2021-11-13 NOTE — Patient Instructions (Addendum)
Medication Instructions:  Your physician recommends that you continue on your current medications as directed. Please refer to the Current Medication list given to you today.  Labwork: BMET within next week [facility] or your family doctor's office Non-fasting  Testing/Procedures: Your physician has requested that you have an echocardiogram. Echocardiography is a painless test that uses sound waves to create images of your heart. It provides your doctor with information about the size and shape of your heart and how well your heart's chambers and valves are working. This procedure takes approximately one hour. There are no restrictions for this procedure. Non-Cardiac CT Angiography (CTA), is a special type of CT scan that uses a computer to produce multi-dimensional views of major blood vessels throughout the body. In CT angiography, a contrast material is injected through an IV to help visualize the blood vessels  Follow-Up: Your physician recommends that you schedule a follow-up appointment in: 1 year. You will receive a reminder call in the mail in about 10 months reminding you to call and schedule your appointment. If you don't receive this call, please contact our office.  Any Other Special Instructions Will Be Listed Below (If Applicable).  If you need a refill on your cardiac medications before your next appointment, please call your pharmacy.

## 2021-11-15 DIAGNOSIS — L97521 Non-pressure chronic ulcer of other part of left foot limited to breakdown of skin: Secondary | ICD-10-CM | POA: Diagnosis not present

## 2021-11-15 DIAGNOSIS — L853 Xerosis cutis: Secondary | ICD-10-CM | POA: Diagnosis not present

## 2021-11-15 DIAGNOSIS — Z7982 Long term (current) use of aspirin: Secondary | ICD-10-CM | POA: Diagnosis not present

## 2021-11-15 DIAGNOSIS — L84 Corns and callosities: Secondary | ICD-10-CM | POA: Diagnosis not present

## 2021-11-15 DIAGNOSIS — I10 Essential (primary) hypertension: Secondary | ICD-10-CM | POA: Diagnosis not present

## 2021-11-15 DIAGNOSIS — E785 Hyperlipidemia, unspecified: Secondary | ICD-10-CM | POA: Diagnosis not present

## 2021-11-15 DIAGNOSIS — E11621 Type 2 diabetes mellitus with foot ulcer: Secondary | ICD-10-CM | POA: Diagnosis not present

## 2021-11-15 DIAGNOSIS — E1140 Type 2 diabetes mellitus with diabetic neuropathy, unspecified: Secondary | ICD-10-CM | POA: Diagnosis not present

## 2021-11-15 DIAGNOSIS — M21372 Foot drop, left foot: Secondary | ICD-10-CM | POA: Diagnosis not present

## 2021-11-15 DIAGNOSIS — L97529 Non-pressure chronic ulcer of other part of left foot with unspecified severity: Secondary | ICD-10-CM | POA: Diagnosis not present

## 2021-11-21 ENCOUNTER — Ambulatory Visit (INDEPENDENT_AMBULATORY_CARE_PROVIDER_SITE_OTHER): Payer: 59

## 2021-11-21 DIAGNOSIS — I351 Nonrheumatic aortic (valve) insufficiency: Secondary | ICD-10-CM

## 2021-11-22 LAB — ECHOCARDIOGRAM COMPLETE
Area-P 1/2: 5.75 cm2
Calc EF: 60.1 %
P 1/2 time: 773 msec
S' Lateral: 2.67 cm
Single Plane A2C EF: 63.7 %
Single Plane A4C EF: 58.2 %

## 2021-11-25 ENCOUNTER — Telehealth: Payer: Self-pay | Admitting: *Deleted

## 2021-11-25 MED ORDER — HYDROCHLOROTHIAZIDE 12.5 MG PO CAPS: 12.5000 mg | ORAL_CAPSULE | Freq: Every day | ORAL | 3 refills | Status: DC

## 2021-11-25 NOTE — Telephone Encounter (Signed)
-----   Message from Satira Sark, MD sent at 11/25/2021  1:42 PM EDT ----- Results reviewed. Potassium normal at 3.7. Creatinine up to 1.7 with known renal insufficiency at baseline. Suggest cut HCTZ to 12.5 mg daily.

## 2021-11-25 NOTE — Telephone Encounter (Signed)
Patient informed and verbalized understanding of plan. Copy sent to PCP 

## 2021-11-26 ENCOUNTER — Other Ambulatory Visit: Payer: 59

## 2021-11-27 ENCOUNTER — Ambulatory Visit (HOSPITAL_COMMUNITY)
Admission: RE | Admit: 2021-11-27 | Discharge: 2021-11-27 | Disposition: A | Discharge status: Home / Self Care | Payer: 59 | Source: Ambulatory Visit | Attending: Cardiology | Admitting: Cardiology

## 2021-11-27 DIAGNOSIS — R590 Localized enlarged lymph nodes: Secondary | ICD-10-CM | POA: Diagnosis not present

## 2021-11-27 DIAGNOSIS — I712 Thoracic aortic aneurysm, without rupture, unspecified: Secondary | ICD-10-CM | POA: Diagnosis not present

## 2021-11-27 DIAGNOSIS — I2510 Atherosclerotic heart disease of native coronary artery without angina pectoris: Secondary | ICD-10-CM | POA: Diagnosis not present

## 2021-11-27 LAB — POCT I-STAT CREATININE: Creatinine, Ser: 1.6 mg/dL — ABNORMAL HIGH (ref 0.61–1.24)

## 2021-11-27 MED ORDER — IOHEXOL 350 MG/ML SOLN
100.0000 mL | Freq: Once | INTRAVENOUS | Status: AC | PRN
  Administered 2021-11-27: 80 mL via INTRAVENOUS

## 2021-11-29 DIAGNOSIS — L97505 Non-pressure chronic ulcer of other part of unspecified foot with muscle involvement without evidence of necrosis: Secondary | ICD-10-CM | POA: Diagnosis not present

## 2021-11-29 DIAGNOSIS — Z7982 Long term (current) use of aspirin: Secondary | ICD-10-CM | POA: Diagnosis not present

## 2021-11-29 DIAGNOSIS — E1140 Type 2 diabetes mellitus with diabetic neuropathy, unspecified: Secondary | ICD-10-CM | POA: Diagnosis not present

## 2021-11-29 DIAGNOSIS — L84 Corns and callosities: Secondary | ICD-10-CM | POA: Diagnosis not present

## 2021-11-29 DIAGNOSIS — E11621 Type 2 diabetes mellitus with foot ulcer: Secondary | ICD-10-CM | POA: Diagnosis not present

## 2021-11-29 DIAGNOSIS — I10 Essential (primary) hypertension: Secondary | ICD-10-CM | POA: Diagnosis not present

## 2021-11-29 DIAGNOSIS — L97519 Non-pressure chronic ulcer of other part of right foot with unspecified severity: Secondary | ICD-10-CM | POA: Diagnosis not present

## 2021-11-29 DIAGNOSIS — L97412 Non-pressure chronic ulcer of right heel and midfoot with fat layer exposed: Secondary | ICD-10-CM | POA: Diagnosis not present

## 2021-11-29 DIAGNOSIS — L97529 Non-pressure chronic ulcer of other part of left foot with unspecified severity: Secondary | ICD-10-CM | POA: Diagnosis not present

## 2021-12-10 IMAGING — CR DG CHEST 2V
3 series · 3 of 3 positions shown · non-contrast
Comparison: August 31, 2019.

CLINICAL DATA: Right-sided renal cell carcinoma.

EXAM:
CHEST - 2 VIEW

[w pa chest]
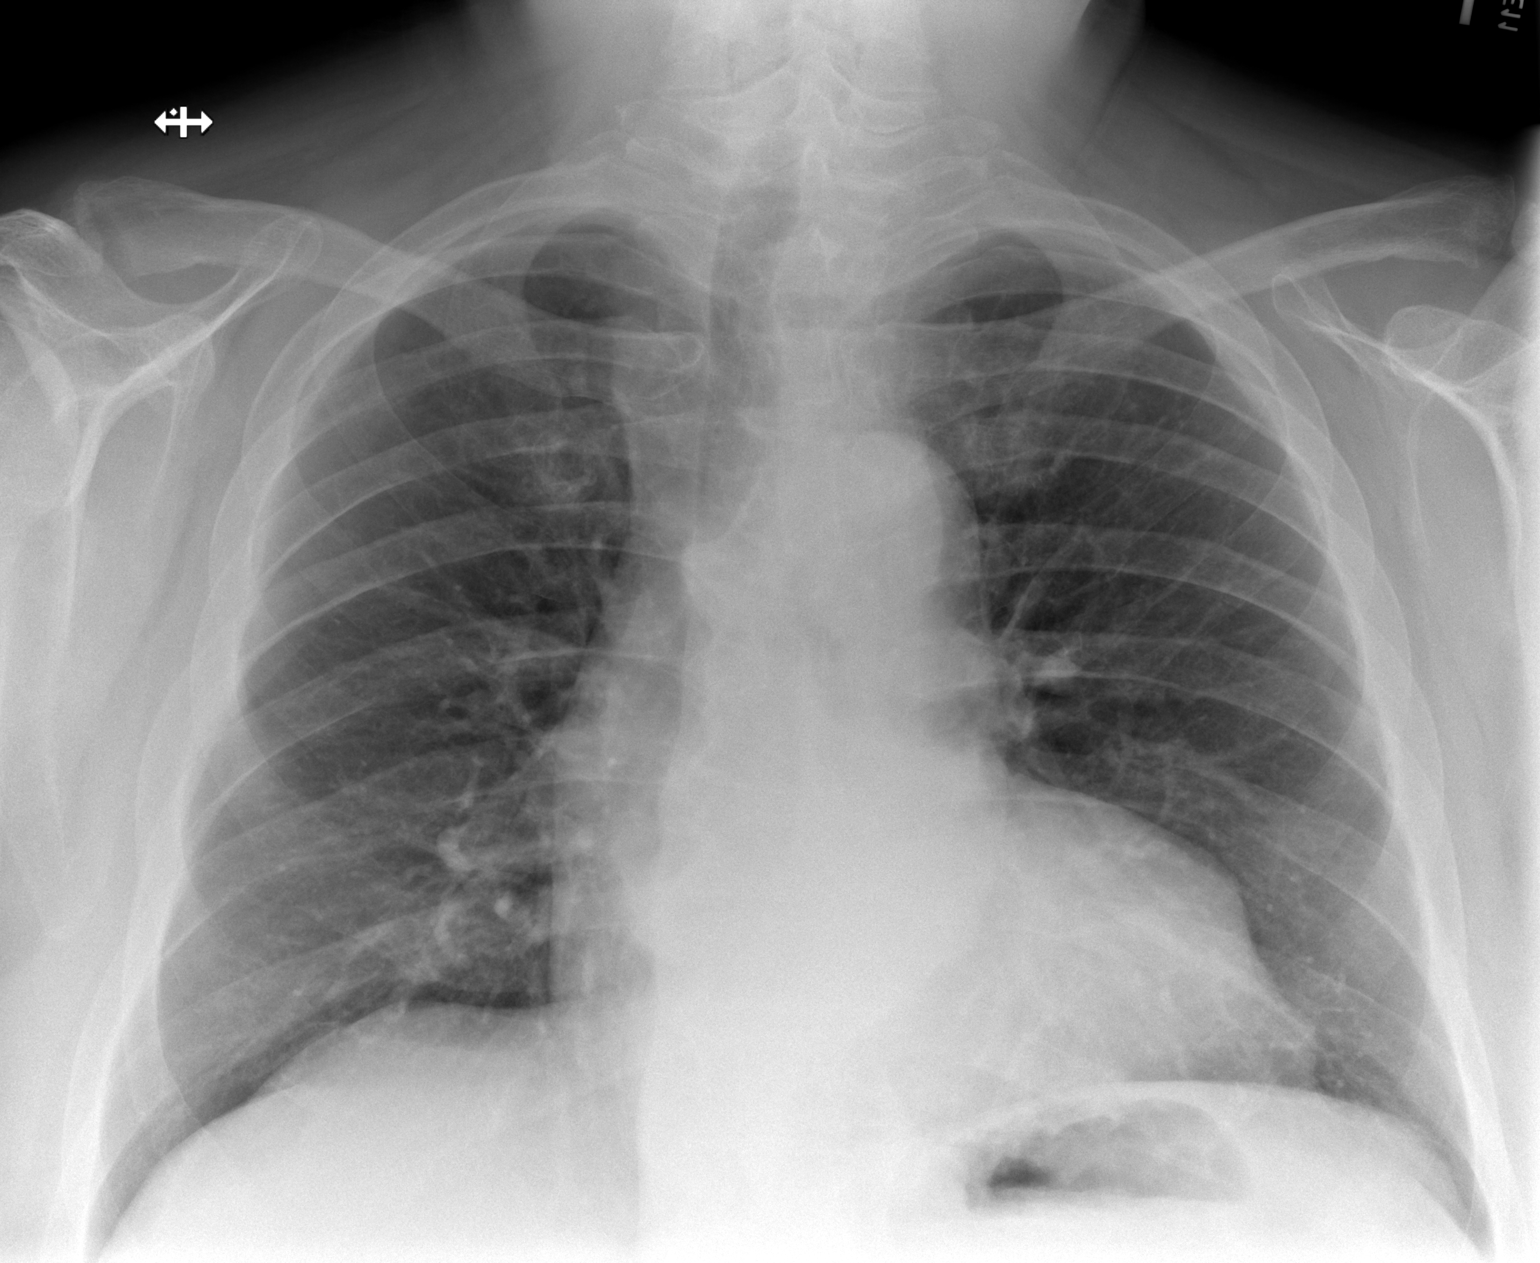

[w chest lat (1 of 2)]
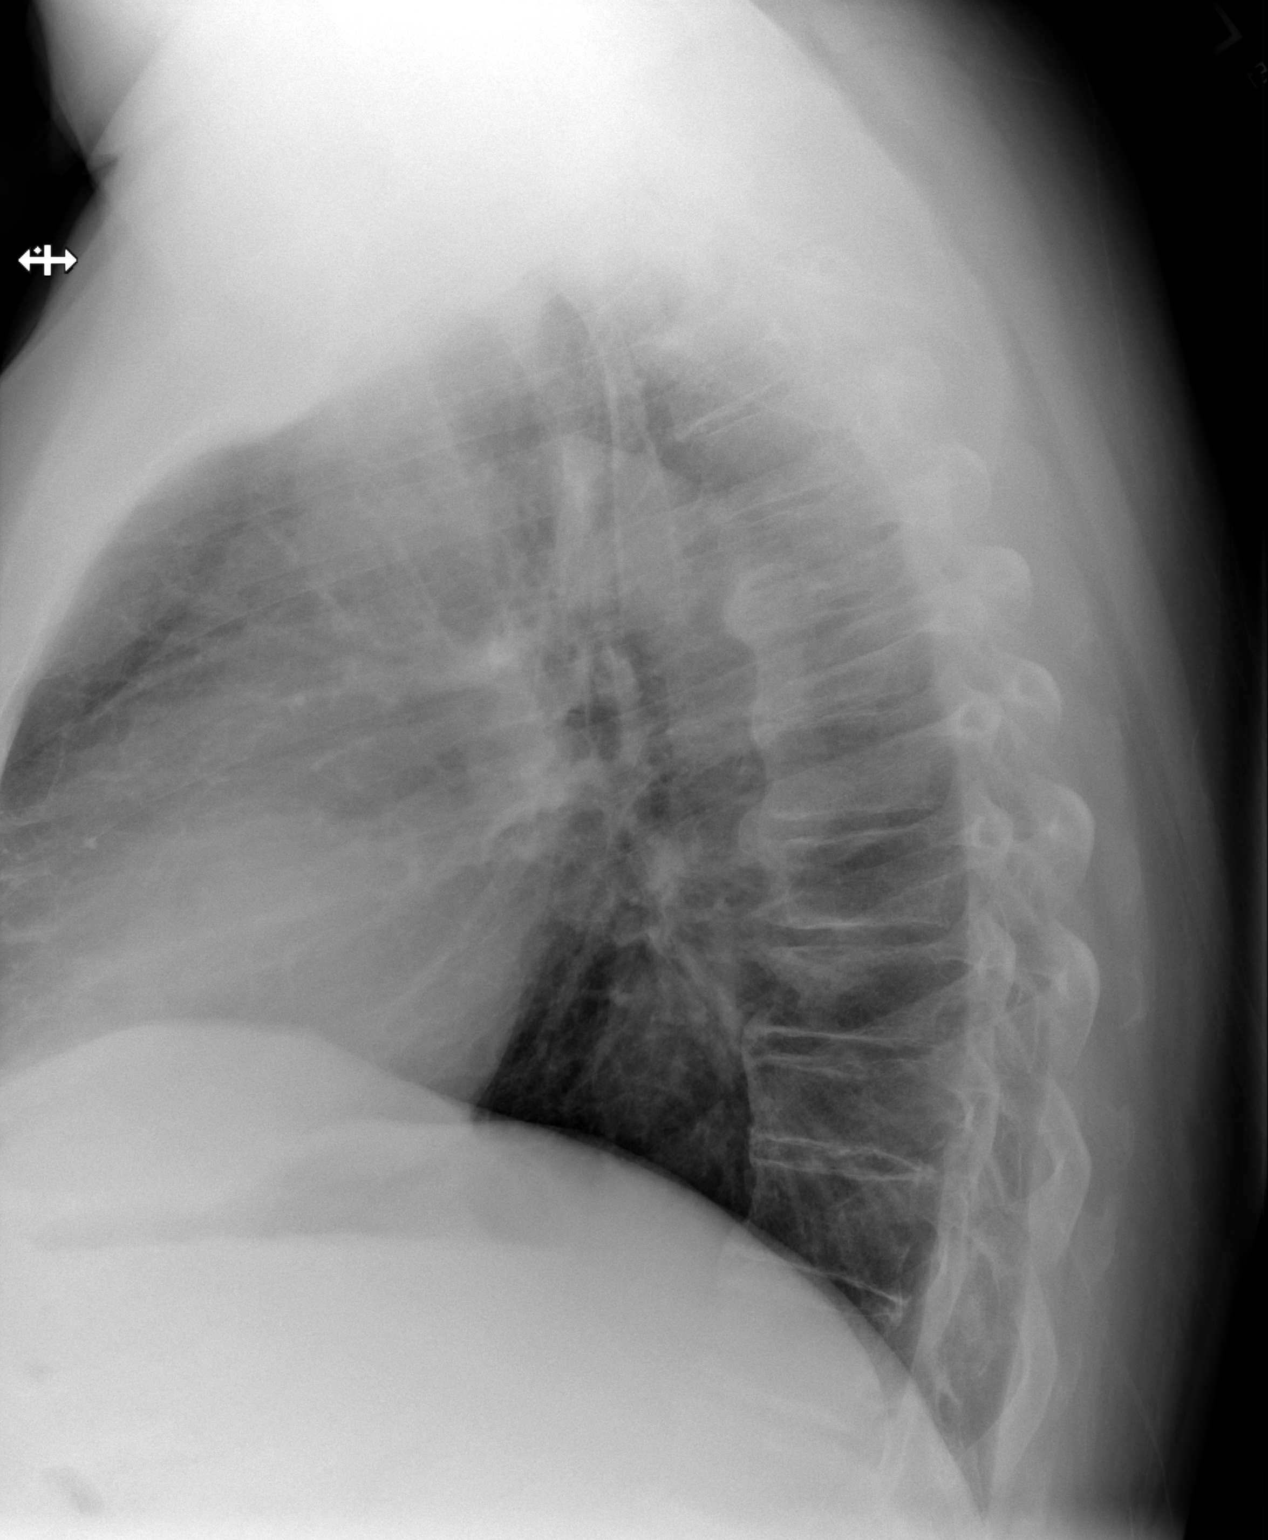

[w chest lat (2 of 2)]
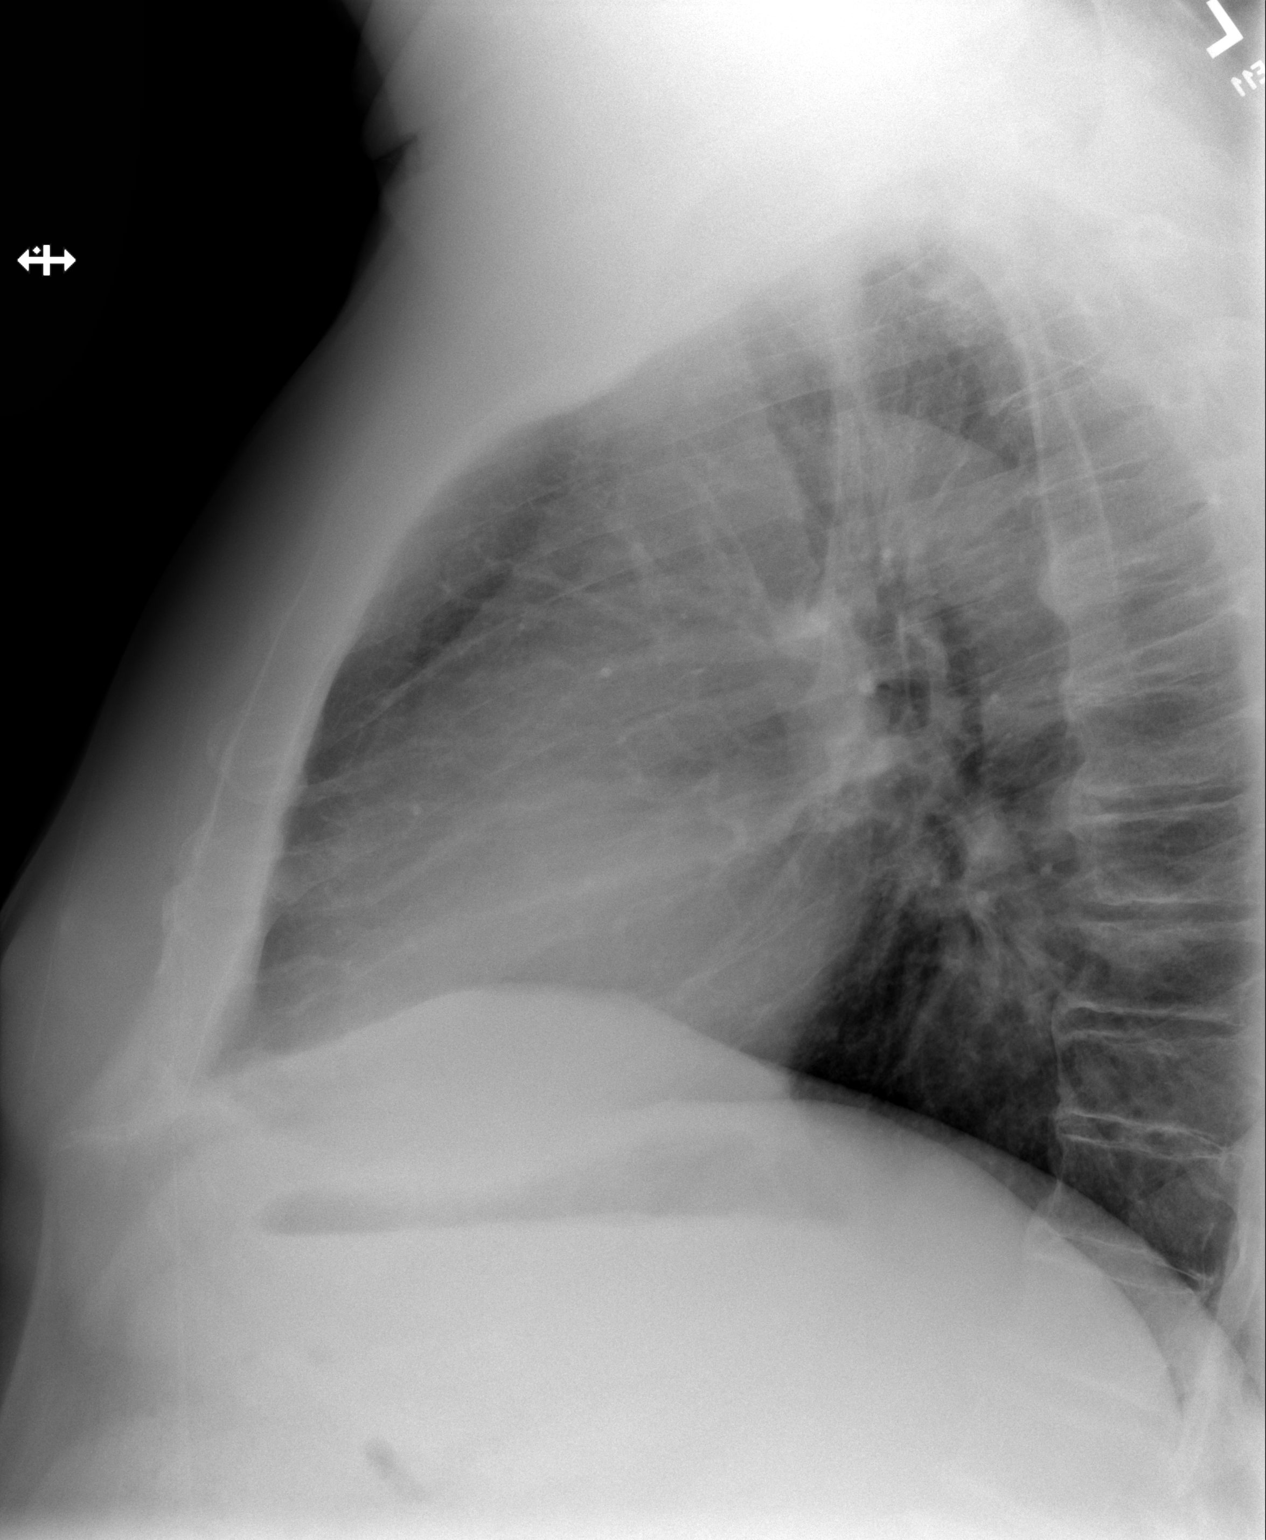

[3 of 3 positions shown; findings below may reference images not displayed]

FINDINGS: The heart size and mediastinal contours are within normal limits.
Both lungs are clear. The visualized skeletal structures are
unremarkable.
IMPRESSION: No active cardiopulmonary disease.

## 2021-12-13 DIAGNOSIS — Z7982 Long term (current) use of aspirin: Secondary | ICD-10-CM | POA: Diagnosis not present

## 2021-12-13 DIAGNOSIS — E11621 Type 2 diabetes mellitus with foot ulcer: Secondary | ICD-10-CM | POA: Diagnosis not present

## 2021-12-13 DIAGNOSIS — L97529 Non-pressure chronic ulcer of other part of left foot with unspecified severity: Secondary | ICD-10-CM | POA: Diagnosis not present

## 2021-12-13 DIAGNOSIS — L97505 Non-pressure chronic ulcer of other part of unspecified foot with muscle involvement without evidence of necrosis: Secondary | ICD-10-CM | POA: Diagnosis not present

## 2021-12-13 DIAGNOSIS — I10 Essential (primary) hypertension: Secondary | ICD-10-CM | POA: Diagnosis not present

## 2021-12-13 DIAGNOSIS — L97412 Non-pressure chronic ulcer of right heel and midfoot with fat layer exposed: Secondary | ICD-10-CM | POA: Diagnosis not present

## 2021-12-13 DIAGNOSIS — E1140 Type 2 diabetes mellitus with diabetic neuropathy, unspecified: Secondary | ICD-10-CM | POA: Diagnosis not present

## 2021-12-13 DIAGNOSIS — L84 Corns and callosities: Secondary | ICD-10-CM | POA: Diagnosis not present

## 2021-12-13 DIAGNOSIS — L97519 Non-pressure chronic ulcer of other part of right foot with unspecified severity: Secondary | ICD-10-CM | POA: Diagnosis not present

## 2021-12-19 ENCOUNTER — Encounter: Payer: Self-pay | Admitting: Family

## 2021-12-19 ENCOUNTER — Ambulatory Visit (INDEPENDENT_AMBULATORY_CARE_PROVIDER_SITE_OTHER): Payer: 59 | Admitting: Family

## 2021-12-19 VITALS — BP 114/73 | HR 97 | Temp 97.0°F | Ht 77.0 in | Wt 298.0 lb

## 2021-12-19 DIAGNOSIS — E1169 Type 2 diabetes mellitus with other specified complication: Secondary | ICD-10-CM | POA: Diagnosis not present

## 2021-12-19 DIAGNOSIS — R509 Fever, unspecified: Secondary | ICD-10-CM | POA: Diagnosis not present

## 2021-12-19 DIAGNOSIS — B349 Viral infection, unspecified: Secondary | ICD-10-CM | POA: Diagnosis not present

## 2021-12-19 LAB — VERITOR FLU A/B WAIVED
Influenza A: NEGATIVE
Influenza B: NEGATIVE

## 2021-12-19 NOTE — Progress Notes (Signed)
Subjective:    Patient ID: Anthony Logan, male    DOB: 12-16-1960, 61 y.o.   MRN: [id number]  Chief Complaint  Patient presents with   Generalized Body Aches   Fever    Started Tuesday 101.6    Headache   Pt presents to the office today with fever, headache, and body aches that started Tuesday.   He did a home COVID test that was negative.   He is a diabetic, but glucose controlled. His glucose this AM was 130.  Fever  This is a new problem. The current episode started in the past 7 days. The problem occurs intermittently. The maximum temperature noted was 101 to 101.9 F. Associated symptoms include headaches, muscle aches and sleepiness. Pertinent negatives include no congestion, coughing, diarrhea, ear pain, nausea, sore throat, urinary pain or vomiting. He has tried fluids, NSAIDs and acetaminophen for the symptoms. The treatment provided mild relief.  Headache  Associated symptoms include a fever and muscle aches. Pertinent negatives include no coughing, ear pain, nausea, sore throat or vomiting.      Review of Systems  Constitutional:  Positive for fever.  HENT:  Negative for congestion, ear pain and sore throat.   Respiratory:  Negative for cough.   Gastrointestinal:  Negative for diarrhea, nausea and vomiting.  Genitourinary:  Negative for dysuria.  Neurological:  Positive for headaches.  All other systems reviewed and are negative.      Objective:   Physical Exam Vitals reviewed.  Constitutional:      General: He is not in acute distress.    Appearance: He is well-developed. He is obese.  HENT:     Head: Normocephalic.     Right Ear: External ear normal.     Left Ear: External ear normal.  Eyes:     General:        Right eye: No discharge.        Left eye: No discharge.     Pupils: Pupils are equal, round, and reactive to light.  Neck:     Thyroid: No thyromegaly.  Cardiovascular:     Rate and Rhythm: Normal rate and regular rhythm.     Heart sounds:  Normal heart sounds. No murmur heard. Pulmonary:     Effort: Pulmonary effort is normal. No respiratory distress.     Breath sounds: Normal breath sounds. No wheezing.  Abdominal:     General: Bowel sounds are normal. There is no distension.     Palpations: Abdomen is soft.     Tenderness: There is no abdominal tenderness.  Musculoskeletal:        General: No tenderness. Normal range of motion.     Cervical back: Normal range of motion and neck supple.  Skin:    General: Skin is warm and dry.     Findings: No erythema or rash.  Neurological:     Mental Status: He is alert and oriented to person, place, and time.     Cranial Nerves: No cranial nerve deficit.     Deep Tendon Reflexes: Reflexes are normal and symmetric.  Psychiatric:        Behavior: Behavior normal.        Thought Content: Thought content normal.        Judgment: Judgment normal.       BP 114/73   Pulse 97   Temp (!) 97 F (36.1 C) (Temporal)   Ht '6\' 5"'$  (1.956 m)   Wt 298 lb (135.2 kg)  SpO2 97%   BMI 35.34 kg/m      Assessment & Plan:  Anthony Logan comes in today with chief complaint of Generalized Body Aches, Fever (Started Tuesday 101.6 ), and Headache   Diagnosis and orders addressed:  1. Fever, unspecified fever cause - Veritor Flu A/B Waived - Novel Coronavirus, NAA ([facility])  2. Viral illness  3. Type 2 diabetes mellitus with other specified complication, without long-term current use of insulin (HCC)  Flu negative COVID pending, but home COVID negative Force fluids Rest Lungs sound good at this time with no SOB. Call if any respiratory symptoms. Pneumonia unlikely.  Tylenol as needed  Glucose stable at this time.  Call office if symptoms worsen or do not improve    Evelina Dun, FNP

## 2021-12-19 NOTE — Patient Instructions (Signed)

## 2021-12-20 ENCOUNTER — Encounter: Payer: Self-pay | Admitting: Family

## 2021-12-20 ENCOUNTER — Telehealth: Payer: Self-pay | Admitting: Family Medicine

## 2021-12-20 ENCOUNTER — Ambulatory Visit (INDEPENDENT_AMBULATORY_CARE_PROVIDER_SITE_OTHER): Payer: 59 | Admitting: Family

## 2021-12-20 DIAGNOSIS — M109 Gout, unspecified: Secondary | ICD-10-CM

## 2021-12-20 LAB — NOVEL CORONAVIRUS, NAA: SARS-CoV-2, NAA: NOT DETECTED

## 2021-12-20 MED ORDER — COLCHICINE 0.6 MG PO TABS: ORAL_TABLET | ORAL | 0 refills | Status: DC

## 2021-12-20 NOTE — Telephone Encounter (Signed)
Appt made

## 2021-12-20 NOTE — Progress Notes (Signed)
Virtual Visit  Note Due to COVID-19 pandemic this visit was conducted virtually. This visit type was conducted due to national recommendations for restrictions regarding the COVID-19 Pandemic (e.g. social distancing, sheltering in place) in an effort to limit this patient's exposure and mitigate transmission in our community. All issues noted in this document were discussed and addressed.  A physical exam was not performed with this format.  I connected with Anthony Logan on 12/20/21 at 3:33 pm  by telephone and verified that I am speaking with the correct person using two identifiers. Anthony Logan is currently located at home and no one is currently with him during visit. The provider, Evelina Dun, FNP is located in their office at time of visit.  I discussed the limitations, risks, security and privacy concerns of performing an evaluation and management service by telephone and the availability of in person appointments. I also discussed with the patient that there may be a patient responsible charge related to this service. The patient expressed understanding and agreed to proceed.  Anthony Logan are scheduled for a virtual visit with your provider today.    Just as we do with appointments in the office, we must obtain your consent to participate.  Your consent will be active for this visit and any virtual visit you may have with one of our providers in the next 365 days.    If you have a MyChart account, I can also send a copy of this consent to you electronically.  All virtual visits are billed to your insurance company just like a traditional visit in the office.  As this is a virtual visit, video technology does not allow for your provider to perform a traditional examination.  This may limit your provider's ability to fully assess your condition.  If your provider identifies any concerns that need to be evaluated in person or the need to arrange testing such as labs, EKG, etc, we will make  arrangements to do so.    Although advances in technology are sophisticated, we cannot ensure that it will always work on either your end or our end.  If the connection with a video visit is poor, we may have to switch to a telephone visit.  With either a video or telephone visit, we are not always able to ensure that we have a secure connection.   I need to obtain your verbal consent now.   Are you willing to proceed with your visit today?   Anthony Logan has provided verbal consent on 12/20/2021 for a virtual visit (video or telephone).   Evelina Dun, [place] 12/20/2021  3:35 PM    History and Present Illness:  HPI PT calls the office today with right ankle pain, swelling, tenderness, warmth. Has hx of gout. He has hx of gout and takes allopurinol 300 mg daily. States this feels similar to past flare ups and hurts to have anything touch ankle.    Review of Systems  Musculoskeletal:  Positive for joint pain.  All other systems reviewed and are negative.    Observations/Objective: No SOB or distress noted   Assessment and Plan: 1. Acute gout of right ankle, unspecified cause Will given colchicine  Continue allopurinol  Low purine diet Force fluids Pt has had fevers, but more than likely viral illness not related to ankle pain. Given no open sores.  - colchicine 0.6 MG tablet; Take 2 tabs immediately, then 1 tab twice per day for the duration of the  flare up to a max of 7 days  Dispense: 21 tablet; Refill: 0     I discussed the assessment and treatment plan with the patient. The patient was provided an opportunity to ask questions and all were answered. The patient agreed with the plan and demonstrated an understanding of the instructions.   The patient was advised to call back or seek an in-person evaluation if the symptoms worsen or if the condition fails to improve as anticipated.  The above assessment and management plan was discussed with the patient. The patient verbalized  understanding of and has agreed to the management plan. Patient is aware to call the clinic if symptoms persist or worsen. Patient is aware when to return to the clinic for a follow-up visit. Patient educated on when it is appropriate to go to the emergency department.   Time call ended:  3:44 pm   I provided 11 minutes of  non face-to-face time during this encounter.    Evelina Dun, FNP

## 2021-12-20 NOTE — Telephone Encounter (Signed)
Please put in my schedule

## 2021-12-20 NOTE — Telephone Encounter (Signed)
  Incoming Patient Call  12/20/2021  What symptoms do you have? Fever in right ankle and ? Gout  How long have you been sick? Since 8-1  Have you been seen for this problem? Yes in the past and seen Christy on 8-3  If your provider decides to give you a prescription, which pharmacy would you like for it to be sent to? [facility]   Patient informed that this information will be sent to the clinical staff for review and that they should receive a follow up call.

## 2021-12-22 ENCOUNTER — Ambulatory Visit (INDEPENDENT_AMBULATORY_CARE_PROVIDER_SITE_OTHER): Payer: 59

## 2021-12-22 ENCOUNTER — Ambulatory Visit
Admission: EM | Admit: 2021-12-22 | Discharge: 2021-12-22 | Disposition: A | Discharge status: Home / Self Care | Payer: 59 | Attending: Nurse Practitioner | Admitting: Nurse Practitioner

## 2021-12-22 DIAGNOSIS — M7989 Other specified soft tissue disorders: Secondary | ICD-10-CM | POA: Diagnosis not present

## 2021-12-22 DIAGNOSIS — M25571 Pain in right ankle and joints of right foot: Secondary | ICD-10-CM | POA: Diagnosis not present

## 2021-12-22 DIAGNOSIS — M79604 Pain in right leg: Secondary | ICD-10-CM

## 2021-12-22 DIAGNOSIS — M109 Gout, unspecified: Secondary | ICD-10-CM | POA: Diagnosis not present

## 2021-12-22 DIAGNOSIS — S91001A Unspecified open wound, right ankle, initial encounter: Secondary | ICD-10-CM | POA: Diagnosis not present

## 2021-12-22 MED ORDER — PREDNISONE 20 MG PO TABS: 20.0000 mg | ORAL_TABLET | Freq: Every day | ORAL | 0 refills | Status: DC

## 2021-12-22 MED ORDER — CEFTRIAXONE SODIUM 1 G IJ SOLR
1.0000 g | Freq: Once | INTRAMUSCULAR | Status: AC
  Administered 2021-12-22: 1 g via INTRAMUSCULAR
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
## 2021-12-22 NOTE — ED Triage Notes (Signed)
Pt reports headache, weakness, feeling hot, swelling in right lower leg and can not put weight in right foot  pain  the patient thought it was related to Gout, pt started taking colchicine 2 days ago.   Pt has a wound in the right lower leg x 1 week, after he scrapped skin with a piece of wood.

## 2021-12-22 NOTE — Discharge Instructions (Addendum)
X-rays are negative for fracture or dislocation in the right ankle and foot.  There is soft tissue swelling seen on x-ray. You were given an injection of ceftriaxone which is an antibiotic to cover for possible infection of the wound. A uric acid level and complete blood count were collected today.  You will be contacted if the results are abnormal.. Take medication as prescribed.  Continue to monitor your blood glucose levels as steroids can cause hyper glycemia. Take over-the-counter Tylenol as needed for pain or discomfort. Go to the emergency department if you develop worsening swelling, inability to bear weight, shortness of breath, difficulty breathing, or other concerns. As discussed, please follow-up with your primary care physician this week for reevaluation.

## 2021-12-23 LAB — CBC WITH DIFFERENTIAL/PLATELET
Basophils Absolute: 0 10*3/uL (ref 0.0–0.2)
Basos: 0 %
EOS (ABSOLUTE): 0 10*3/uL (ref 0.0–0.4)
Eos: 0 %
Hematocrit: 37 % — ABNORMAL LOW (ref 37.5–51.0)
Hemoglobin: 12.8 g/dL — ABNORMAL LOW (ref 13.0–17.7)
Immature Grans (Abs): 0 10*3/uL (ref 0.0–0.1)
Immature Granulocytes: 0 %
Lymphocytes Absolute: 1.3 10*3/uL (ref 0.7–3.1)
Lymphs: 8 %
MCH: 33.2 pg — ABNORMAL HIGH (ref 26.6–33.0)
MCHC: 34.6 g/dL (ref 31.5–35.7)
MCV: 96 fL (ref 79–97)
Monocytes Absolute: 1.1 10*3/uL — ABNORMAL HIGH (ref 0.1–0.9)
Monocytes: 7 %
Neutrophils Absolute: 13.3 10*3/uL — ABNORMAL HIGH (ref 1.4–7.0)
Neutrophils: 85 %
Platelets: 272 10*3/uL (ref 150–450)
RBC: 3.86 x10E6/uL — ABNORMAL LOW (ref 4.14–5.80)
RDW: 11.9 % (ref 11.6–15.4)
WBC: 15.9 10*3/uL — ABNORMAL HIGH (ref 3.4–10.8)

## 2021-12-23 LAB — URIC ACID: Uric Acid: 6.3 mg/dL (ref 3.8–8.4)

## 2021-12-24 ENCOUNTER — Emergency Department (HOSPITAL_COMMUNITY): Payer: 59

## 2021-12-24 ENCOUNTER — Telehealth (HOSPITAL_COMMUNITY): Payer: Self-pay | Admitting: Emergency Medicine

## 2021-12-24 ENCOUNTER — Inpatient Hospital Stay (HOSPITAL_COMMUNITY)
Admission: EM | Admit: 2021-12-24 | Discharge: 2021-12-28 | DRG: 603 | Disposition: A | Discharge status: Home / Self Care | Payer: 59 | Attending: Internal Medicine | Admitting: Internal Medicine

## 2021-12-24 ENCOUNTER — Inpatient Hospital Stay (HOSPITAL_COMMUNITY): Payer: 59

## 2021-12-24 ENCOUNTER — Encounter (HOSPITAL_COMMUNITY): Payer: Self-pay | Admitting: Emergency Medicine

## 2021-12-24 ENCOUNTER — Other Ambulatory Visit: Payer: Self-pay

## 2021-12-24 DIAGNOSIS — Z872 Personal history of diseases of the skin and subcutaneous tissue: Secondary | ICD-10-CM | POA: Diagnosis not present

## 2021-12-24 DIAGNOSIS — E785 Hyperlipidemia, unspecified: Secondary | ICD-10-CM | POA: Diagnosis present

## 2021-12-24 DIAGNOSIS — E1140 Type 2 diabetes mellitus with diabetic neuropathy, unspecified: Secondary | ICD-10-CM | POA: Diagnosis present

## 2021-12-24 DIAGNOSIS — Z6834 Body mass index (BMI) 34.0-34.9, adult: Secondary | ICD-10-CM

## 2021-12-24 DIAGNOSIS — L97529 Non-pressure chronic ulcer of other part of left foot with unspecified severity: Secondary | ICD-10-CM | POA: Diagnosis not present

## 2021-12-24 DIAGNOSIS — R6510 Systemic inflammatory response syndrome (SIRS) of non-infectious origin without acute organ dysfunction: Secondary | ICD-10-CM | POA: Diagnosis not present

## 2021-12-24 DIAGNOSIS — F32A Depression, unspecified: Secondary | ICD-10-CM | POA: Diagnosis present

## 2021-12-24 DIAGNOSIS — W208XXA Other cause of strike by thrown, projected or falling object, initial encounter: Secondary | ICD-10-CM | POA: Diagnosis present

## 2021-12-24 DIAGNOSIS — E1169 Type 2 diabetes mellitus with other specified complication: Secondary | ICD-10-CM | POA: Diagnosis present

## 2021-12-24 DIAGNOSIS — E669 Obesity, unspecified: Secondary | ICD-10-CM | POA: Diagnosis present

## 2021-12-24 DIAGNOSIS — E1159 Type 2 diabetes mellitus with other circulatory complications: Secondary | ICD-10-CM | POA: Diagnosis present

## 2021-12-24 DIAGNOSIS — Z7952 Long term (current) use of systemic steroids: Secondary | ICD-10-CM

## 2021-12-24 DIAGNOSIS — Z20822 Contact with and (suspected) exposure to covid-19: Secondary | ICD-10-CM | POA: Diagnosis present

## 2021-12-24 DIAGNOSIS — L97509 Non-pressure chronic ulcer of other part of unspecified foot with unspecified severity: Secondary | ICD-10-CM

## 2021-12-24 DIAGNOSIS — Z8261 Family history of arthritis: Secondary | ICD-10-CM

## 2021-12-24 DIAGNOSIS — M25471 Effusion, right ankle: Secondary | ICD-10-CM | POA: Diagnosis not present

## 2021-12-24 DIAGNOSIS — R600 Localized edema: Secondary | ICD-10-CM | POA: Diagnosis not present

## 2021-12-24 DIAGNOSIS — M7989 Other specified soft tissue disorders: Secondary | ICD-10-CM | POA: Diagnosis not present

## 2021-12-24 DIAGNOSIS — E1122 Type 2 diabetes mellitus with diabetic chronic kidney disease: Secondary | ICD-10-CM | POA: Diagnosis not present

## 2021-12-24 DIAGNOSIS — Z905 Acquired absence of kidney: Secondary | ICD-10-CM

## 2021-12-24 DIAGNOSIS — N179 Acute kidney failure, unspecified: Secondary | ICD-10-CM | POA: Diagnosis not present

## 2021-12-24 DIAGNOSIS — Z8249 Family history of ischemic heart disease and other diseases of the circulatory system: Secondary | ICD-10-CM

## 2021-12-24 DIAGNOSIS — G4730 Sleep apnea, unspecified: Secondary | ICD-10-CM | POA: Diagnosis present

## 2021-12-24 DIAGNOSIS — Z833 Family history of diabetes mellitus: Secondary | ICD-10-CM

## 2021-12-24 DIAGNOSIS — L03115 Cellulitis of right lower limb: Secondary | ICD-10-CM | POA: Diagnosis not present

## 2021-12-24 DIAGNOSIS — E1165 Type 2 diabetes mellitus with hyperglycemia: Secondary | ICD-10-CM | POA: Diagnosis present

## 2021-12-24 DIAGNOSIS — I152 Hypertension secondary to endocrine disorders: Secondary | ICD-10-CM | POA: Diagnosis not present

## 2021-12-24 DIAGNOSIS — N1831 Chronic kidney disease, stage 3a: Secondary | ICD-10-CM | POA: Diagnosis present

## 2021-12-24 DIAGNOSIS — Z818 Family history of other mental and behavioral disorders: Secondary | ICD-10-CM

## 2021-12-24 DIAGNOSIS — Z808 Family history of malignant neoplasm of other organs or systems: Secondary | ICD-10-CM | POA: Diagnosis not present

## 2021-12-24 DIAGNOSIS — Z7985 Long-term (current) use of injectable non-insulin antidiabetic drugs: Secondary | ICD-10-CM

## 2021-12-24 DIAGNOSIS — Z79899 Other long term (current) drug therapy: Secondary | ICD-10-CM

## 2021-12-24 DIAGNOSIS — Z7982 Long term (current) use of aspirin: Secondary | ICD-10-CM

## 2021-12-24 DIAGNOSIS — Z888 Allergy status to other drugs, medicaments and biological substances status: Secondary | ICD-10-CM

## 2021-12-24 DIAGNOSIS — E119 Type 2 diabetes mellitus without complications: Secondary | ICD-10-CM

## 2021-12-24 DIAGNOSIS — M109 Gout, unspecified: Secondary | ICD-10-CM | POA: Diagnosis not present

## 2021-12-24 DIAGNOSIS — Z8349 Family history of other endocrine, nutritional and metabolic diseases: Secondary | ICD-10-CM

## 2021-12-24 DIAGNOSIS — E872 Acidosis, unspecified: Secondary | ICD-10-CM | POA: Diagnosis not present

## 2021-12-24 DIAGNOSIS — N189 Chronic kidney disease, unspecified: Secondary | ICD-10-CM | POA: Diagnosis present

## 2021-12-24 DIAGNOSIS — M659 Synovitis and tenosynovitis, unspecified: Secondary | ICD-10-CM | POA: Diagnosis present

## 2021-12-24 DIAGNOSIS — R700 Elevated erythrocyte sedimentation rate: Secondary | ICD-10-CM | POA: Diagnosis present

## 2021-12-24 DIAGNOSIS — Z803 Family history of malignant neoplasm of breast: Secondary | ICD-10-CM

## 2021-12-24 DIAGNOSIS — E11621 Type 2 diabetes mellitus with foot ulcer: Secondary | ICD-10-CM | POA: Diagnosis not present

## 2021-12-24 DIAGNOSIS — R739 Hyperglycemia, unspecified: Secondary | ICD-10-CM | POA: Diagnosis present

## 2021-12-24 DIAGNOSIS — Z87891 Personal history of nicotine dependence: Secondary | ICD-10-CM

## 2021-12-24 DIAGNOSIS — Z23 Encounter for immunization: Secondary | ICD-10-CM

## 2021-12-24 DIAGNOSIS — R7982 Elevated C-reactive protein (CRP): Secondary | ICD-10-CM | POA: Diagnosis present

## 2021-12-24 DIAGNOSIS — Z820 Family history of epilepsy and other diseases of the nervous system: Secondary | ICD-10-CM

## 2021-12-24 DIAGNOSIS — Z8371 Family history of colonic polyps: Secondary | ICD-10-CM

## 2021-12-24 LAB — CBC WITH DIFFERENTIAL/PLATELET
Abs Immature Granulocytes: 0.07 10*3/uL (ref 0.00–0.07)
Basophils Absolute: 0 10*3/uL (ref 0.0–0.1)
Basophils Relative: 0 %
Eosinophils Absolute: 0.4 10*3/uL (ref 0.0–0.5)
Eosinophils Relative: 3 %
HCT: 39.5 % (ref 39.0–52.0)
Hemoglobin: 13.1 g/dL (ref 13.0–17.0)
Immature Granulocytes: 1 %
Lymphocytes Relative: 6 %
Lymphs Abs: 0.7 10*3/uL (ref 0.7–4.0)
MCH: 33 pg (ref 26.0–34.0)
MCHC: 33.2 g/dL (ref 30.0–36.0)
MCV: 99.5 fL (ref 80.0–100.0)
Monocytes Absolute: 0.8 10*3/uL (ref 0.1–1.0)
Monocytes Relative: 6 %
Neutro Abs: 10.9 10*3/uL — ABNORMAL HIGH (ref 1.7–7.7)
Neutrophils Relative %: 84 %
Platelets: 295 10*3/uL (ref 150–400)
RBC: 3.97 MIL/uL — ABNORMAL LOW (ref 4.22–5.81)
RDW: 13.3 % (ref 11.5–15.5)
WBC: 12.8 10*3/uL — ABNORMAL HIGH (ref 4.0–10.5)
nRBC: 0 % (ref 0.0–0.2)

## 2021-12-24 LAB — HEPATIC FUNCTION PANEL
ALT: 81 U/L — ABNORMAL HIGH (ref 0–44)
AST: 81 U/L — ABNORMAL HIGH (ref 15–41)
Albumin: 2.9 g/dL — ABNORMAL LOW (ref 3.5–5.0)
Alkaline Phosphatase: 106 U/L (ref 38–126)
Bilirubin, Direct: 0.3 mg/dL — ABNORMAL HIGH (ref 0.0–0.2)
Indirect Bilirubin: 0.5 mg/dL (ref 0.3–0.9)
Total Bilirubin: 0.8 mg/dL (ref 0.3–1.2)
Total Protein: 7.4 g/dL (ref 6.5–8.1)

## 2021-12-24 LAB — BASIC METABOLIC PANEL
Anion gap: 9 (ref 5–15)
BUN: 36 mg/dL — ABNORMAL HIGH (ref 6–20)
CO2: 28 mmol/L (ref 22–32)
Calcium: 8.5 mg/dL — ABNORMAL LOW (ref 8.9–10.3)
Chloride: 97 mmol/L — ABNORMAL LOW (ref 98–111)
Creatinine, Ser: 1.44 mg/dL — ABNORMAL HIGH (ref 0.61–1.24)
GFR, Estimated: 56 mL/min — ABNORMAL LOW (ref >60)
Glucose, Bld: 329 mg/dL — ABNORMAL HIGH (ref 70–99)
Potassium: 4.7 mmol/L (ref 3.5–5.1)
Sodium: 134 mmol/L — ABNORMAL LOW (ref 135–145)

## 2021-12-24 LAB — GLUCOSE, CAPILLARY: Glucose-Capillary: 202 mg/dL — ABNORMAL HIGH (ref 70–99)

## 2021-12-24 LAB — SEDIMENTATION RATE: Sed Rate: 117 mm/hr — ABNORMAL HIGH (ref 0–16)

## 2021-12-24 LAB — LACTIC ACID, PLASMA
Lactic Acid, Venous: 2.2 mmol/L (ref 0.5–1.9)
Lactic Acid, Venous: 1.8 mmol/L (ref 0.5–1.9)

## 2021-12-24 LAB — C-REACTIVE PROTEIN: CRP: 21.2 mg/dL — ABNORMAL HIGH (ref <1.0)

## 2021-12-24 MED ORDER — INSULIN ASPART 100 UNIT/ML IJ SOLN
0.0000 [IU] | Freq: Three times a day (TID) | INTRAMUSCULAR | Status: DC
  Administered 2021-12-25: 2 [IU] via SUBCUTANEOUS
  Administered 2021-12-25: 5 [IU] via SUBCUTANEOUS
  Administered 2021-12-26: 2 [IU] via SUBCUTANEOUS
  Administered 2021-12-26 (×2): 3 [IU] via SUBCUTANEOUS
  Administered 2021-12-27 (×2): 5 [IU] via SUBCUTANEOUS
  Administered 2021-12-27: 2 [IU] via SUBCUTANEOUS
  Administered 2021-12-28: 3 [IU] via SUBCUTANEOUS
  Administered 2021-12-28: 5 [IU] via SUBCUTANEOUS

## 2021-12-24 MED ORDER — CEFAZOLIN SODIUM-DEXTROSE 2-4 GM/100ML-% IV SOLN
2.0000 g | Freq: Once | INTRAVENOUS | Status: AC
  Administered 2021-12-24: 2 g via INTRAVENOUS
  Filled 2021-12-24: qty 100

## 2021-12-24 MED ORDER — SODIUM CHLORIDE 0.9 % IV SOLN
2.0000 g | INTRAVENOUS | Status: DC
  Administered 2021-12-24 – 2021-12-27 (×4): 2 g via INTRAVENOUS
  Filled 2021-12-24 (×4): qty 20

## 2021-12-24 MED ORDER — ENOXAPARIN SODIUM 40 MG/0.4ML IJ SOSY
40.0000 mg | PREFILLED_SYRINGE | INTRAMUSCULAR | Status: DC
Start: 2021-12-24 — End: 2021-12-28
  Administered 2021-12-24 – 2021-12-27 (×4): 40 mg via SUBCUTANEOUS
  Filled 2021-12-24 (×4): qty 0.4

## 2021-12-24 MED ORDER — ALLOPURINOL 100 MG PO TABS
300.0000 mg | ORAL_TABLET | Freq: Every day | ORAL | Status: DC
  Administered 2021-12-25 – 2021-12-28 (×4): 300 mg via ORAL
  Filled 2021-12-24 (×5): qty 3

## 2021-12-24 MED ORDER — GABAPENTIN 300 MG PO CAPS
600.0000 mg | ORAL_CAPSULE | Freq: Three times a day (TID) | ORAL | Status: DC
  Administered 2021-12-24 – 2021-12-28 (×11): 600 mg via ORAL
  Filled 2021-12-24 (×11): qty 2

## 2021-12-24 MED ORDER — BUPROPION HCL ER (XL) 300 MG PO TB24
300.0000 mg | ORAL_TABLET | Freq: Every day | ORAL | Status: DC
  Administered 2021-12-25 – 2021-12-28 (×4): 300 mg via ORAL
  Filled 2021-12-24 (×5): qty 1

## 2021-12-24 MED ORDER — POLYETHYLENE GLYCOL 3350 17 G PO PACK: 17.0000 g | PACK | Freq: Every day | ORAL | Status: DC | PRN

## 2021-12-24 MED ORDER — ACETAMINOPHEN 650 MG RE SUPP: 650.0000 mg | Freq: Four times a day (QID) | RECTAL | Status: DC | PRN

## 2021-12-24 MED ORDER — METOPROLOL TARTRATE 50 MG PO TABS
200.0000 mg | ORAL_TABLET | Freq: Two times a day (BID) | ORAL | Status: DC
  Administered 2021-12-24 – 2021-12-27 (×6): 200 mg via ORAL
  Filled 2021-12-24 (×6): qty 4

## 2021-12-24 MED ORDER — HYDROCHLOROTHIAZIDE 12.5 MG PO TABS
12.5000 mg | ORAL_TABLET | Freq: Every day | ORAL | Status: DC
  Administered 2021-12-25 – 2021-12-26 (×2): 12.5 mg via ORAL
  Filled 2021-12-24 (×2): qty 1

## 2021-12-24 MED ORDER — HYDRALAZINE HCL 25 MG PO TABS
50.0000 mg | ORAL_TABLET | Freq: Three times a day (TID) | ORAL | Status: DC
  Administered 2021-12-24 – 2021-12-28 (×10): 50 mg via ORAL
  Filled 2021-12-24 (×11): qty 2

## 2021-12-24 MED ORDER — ACETAMINOPHEN 325 MG PO TABS
650.0000 mg | ORAL_TABLET | Freq: Four times a day (QID) | ORAL | Status: DC | PRN
  Administered 2021-12-25 – 2021-12-26 (×4): 650 mg via ORAL
  Filled 2021-12-24 (×4): qty 2

## 2021-12-24 MED ORDER — ASPIRIN 81 MG PO CHEW
81.0000 mg | CHEWABLE_TABLET | Freq: Every day | ORAL | Status: DC
  Administered 2021-12-25 – 2021-12-28 (×4): 81 mg via ORAL
  Filled 2021-12-24 (×5): qty 1

## 2021-12-24 MED ORDER — ONDANSETRON HCL 4 MG PO TABS: 4.0000 mg | ORAL_TABLET | Freq: Four times a day (QID) | ORAL | Status: DC | PRN

## 2021-12-24 MED ORDER — TETANUS-DIPHTH-ACELL PERTUSSIS 5-2.5-18.5 LF-MCG/0.5 IM SUSY
0.5000 mL | PREFILLED_SYRINGE | Freq: Once | INTRAMUSCULAR | Status: AC
Start: 2021-12-24 — End: 2021-12-25
  Administered 2021-12-25: 0.5 mL via INTRAMUSCULAR
  Filled 2021-12-24 (×2): qty 0.5

## 2021-12-24 MED ORDER — FUROSEMIDE 40 MG PO TABS
40.0000 mg | ORAL_TABLET | Freq: Every day | ORAL | Status: DC
  Administered 2021-12-25 – 2021-12-26 (×2): 40 mg via ORAL
  Filled 2021-12-24 (×2): qty 1

## 2021-12-24 MED ORDER — INSULIN ASPART 100 UNIT/ML IJ SOLN: 0.0000 [IU] | Freq: Every day | INTRAMUSCULAR | Status: DC

## 2021-12-24 MED ORDER — LACTATED RINGERS IV BOLUS
1000.0000 mL | Freq: Once | INTRAVENOUS | Status: AC
  Administered 2021-12-24: 1000 mL via INTRAVENOUS

## 2021-12-24 MED ORDER — ONDANSETRON HCL 4 MG/2ML IJ SOLN: 4.0000 mg | Freq: Four times a day (QID) | INTRAMUSCULAR | Status: DC | PRN

## 2021-12-24 MED ORDER — ATORVASTATIN CALCIUM 20 MG PO TABS
20.0000 mg | ORAL_TABLET | Freq: Every day | ORAL | Status: DC
  Administered 2021-12-24 – 2021-12-28 (×5): 20 mg via ORAL
  Filled 2021-12-24 (×5): qty 1

## 2021-12-24 MED ORDER — LISINOPRIL 10 MG PO TABS
40.0000 mg | ORAL_TABLET | Freq: Every day | ORAL | Status: DC
  Administered 2021-12-25 – 2021-12-27 (×3): 40 mg via ORAL
  Filled 2021-12-24 (×3): qty 4

## 2021-12-24 NOTE — ED Triage Notes (Signed)
Pt presents with right foot infection, lower leg warm to touch, seen at [facility] and labs revealed elevated WBC.

## 2021-12-24 NOTE — Assessment & Plan Note (Addendum)
Secondary to open wound.  No purulence appreciated.  Right leg is swollen, with mild erythema, with pain.  Leukocytosis of 12.8.  Lactic acid 2.2 > 1.8 after 1 L bolus.  Rules out for sepsis at this time.  X-rays of right foot and ankle unremarkable.  ESR markedly elevated at 117. -CRP pending -IV ceftriaxone 2 g daily -Follow-up blood cultures -Ibuprofen 600 mg every 8 hourly as needed for pain x 3 doses - Tdap booster dose x 1

## 2021-12-24 NOTE — Telephone Encounter (Signed)
Patient is being discharged post visit from Urgent Care and sent to the Emergency Department via private vehicle . Per Dr. Windy Carina, patient is in need of higher level of care due to elevated WBC with pain to foot and ankle that resolved with Ceftriaxone shot, but has worsened since.. Patient is aware and verbalizes understanding of plan of care.

## 2021-12-24 NOTE — ED Provider Notes (Signed)
[facility] EMERGENCY DEPARTMENT Provider Note   CSN: [id number] Arrival date & time: 12/24/21  1404     History  Chief Complaint  Patient presents with   Wound Infection    Anthony Logan is a 61 y.o. male.  HPI  Patient with medical history of type 2 diabetes, neuropathy, hyperlipidemia, hypertension, chronic kidney disease presents today due to right lower extremity pain and redness.  Patient cut the lateral aspect of his right ankle about a week and a half ago while working on the deck.  Seen by PCP 12/20/21, hx of gout and told PCP feels like prior gout flares. Had subjective fevers attributed to coinfection of possible virus, prescribed colchicine. Seen at urgent care 12/22/21, labs drawn and negative imaging.  Given 1 g of Rocephin at that time and discharged on steroids for suspected gout.  Pain initially resolved with Rocephin but it is since returned and worsened.  Patient was called by urgent care today, he had an elevated white count and given pain improved with the antibiotics was encouraged to go to emergency department for further evaluation due to concern about infectious etiology.   Patient states that the redness and swelling has been spreading since then, it is painful and feels like a burning sensation, worse to medial aspect of right foot.  Unsure when his last tetanus shot was.  Home Medications Prior to Admission medications   Medication Sig Start Date End Date Taking? Authorizing Provider  allopurinol (ZYLOPRIM) 300 MG tablet Take 1 tablet (300 mg total) by mouth daily. 02/20/21  Yes Gottschalk, Leatrice Jewels M, DO  Alpha-Lipoic Acid 600 MG CAPS Take 1 capsule (600 mg total) by mouth daily. For diabetic neuropathy 09/26/21  Yes Ronnie Doss M, DO  aspirin 81 MG chewable tablet Chew 81 mg by mouth daily.   Yes [provider]  atorvastatin (LIPITOR) 20 MG tablet Take 1 tablet (20 mg total) by mouth daily. 02/20/21  Yes Gottschalk, Ashly M, DO  buPROPion (WELLBUTRIN  XL) 300 MG 24 hr tablet Take 1 tablet (300 mg total) by mouth daily. 02/20/21  Yes Ronnie Doss M, DO  cholecalciferol (VITAMIN D) 1000 units tablet Take 1,000 Units by mouth daily.   Yes [provider]  colchicine 0.6 MG tablet Take 2 tabs immediately, then 1 tab twice per day for the duration of the flare up to a max of 7 days Patient taking differently: Take 0.6 mg by mouth 2 (two) times daily. 12/20/21  Yes Hawks, Alyse Low A, FNP  cyclobenzaprine (FLEXERIL) 10 MG tablet TAKE 1 TABLET AT BEDTIME 10/21/21  Yes Gottschalk, Ashly M, DO  furosemide (LASIX) 20 MG tablet Take 2 tablets (40 mg total) by mouth daily. Patient taking differently: Take 20 mg by mouth 2 (two) times daily. 02/20/21  Yes Gottschalk, Leatrice Jewels M, DO  gabapentin (NEURONTIN) 600 MG tablet Take 1 tablet (600 mg total) by mouth 3 (three) times daily. 11/04/21  Yes Gottschalk, Leatrice Jewels M, DO  hydrALAZINE (APRESOLINE) 50 MG tablet TAKE 1 TABLET 3 TIMES A DAY(DOSE INCREASE) Patient taking differently: Take 50 mg by mouth 3 (three) times daily. 07/24/21  Yes Satira Sark, MD  hydrochlorothiazide (MICROZIDE) 12.5 MG capsule Take 1 capsule (12.5 mg total) by mouth daily. 11/25/21  Yes Satira Sark, MD  lisinopril (ZESTRIL) 40 MG tablet Take 1 tablet (40 mg total) by mouth daily. 02/20/21  Yes Ronnie Doss M, DO  metoprolol tartrate (LOPRESSOR) 100 MG tablet Take 2 tablets (200 mg total) by mouth  2 (two) times daily. 02/20/21  Yes Ronnie Doss M, DO  Multiple Vitamin (MULTIVITAMIN ADULT PO) Take 1 tablet by mouth daily.   Yes [provider]  predniSONE (DELTASONE) 20 MG tablet Take 1 tablet (20 mg total) by mouth daily with breakfast for 5 days. 12/22/21 12/27/21 Yes Leath-Warren, Alda Lea, NP  Semaglutide, 2 MG/DOSE, 8 MG/3ML SOPN Inject 2 mg as directed once a week. 07/24/21  Yes Nida, Marella Chimes, MD  sildenafil (VIAGRA) 100 MG tablet Take 1 tablet (100 mg total) by mouth daily as needed for erectile  dysfunction. 04/17/21  Yes McKenzie, Candee Furbish, MD  Accu-Chek FastClix Lancets MISC USE TO TEST GLUCOSE ONCE A DAY AS DIRECTED 07/22/21   Cassandria Anger, MD      Allergies    Diltiazem, Clonidine derivatives, Procardia [nifedipine], and Betadine [povidone-iodine]    Review of Systems   Review of Systems  Physical Exam Updated Vital Signs BP 120/74   Pulse 79   Temp (S) 98.7 F (37.1 C) (Rectal)   Resp 18   Ht '6\' 5"'$  (1.956 m)   Wt 133.4 kg   SpO2 96%   BMI 34.86 kg/m  Physical Exam Vitals and nursing note reviewed. Exam conducted with a chaperone present.  Constitutional:      Appearance: Normal appearance.  HENT:     Head: Normocephalic and atraumatic.  Eyes:     General: No scleral icterus.       Right eye: No discharge.        Left eye: No discharge.     Extraocular Movements: Extraocular movements intact.     Pupils: Pupils are equal, round, and reactive to light.  Cardiovascular:     Rate and Rhythm: Normal rate and regular rhythm.     Pulses: Normal pulses.     Heart sounds: Normal heart sounds. No murmur heard.    No friction rub. No gallop.  Pulmonary:     Effort: Pulmonary effort is normal. No respiratory distress.     Breath sounds: Normal breath sounds.  Abdominal:     General: Abdomen is flat. Bowel sounds are normal. There is no distension.     Palpations: Abdomen is soft.     Tenderness: There is no abdominal tenderness.  Skin:    General: Skin is warm and dry.     Capillary Refill: Capillary refill takes less than 2 seconds.     Coloration: Skin is not jaundiced.     Findings: Erythema present.  Neurological:     Mental Status: He is alert. Mental status is at baseline.     Coordination: Coordination normal.          ED Results / Procedures / Treatments   Labs (all labs ordered are listed, but only abnormal results are displayed) Labs Reviewed  CBC WITH DIFFERENTIAL/PLATELET - Abnormal; Notable for the following components:       Result Value   WBC 12.8 (*)    RBC 3.97 (*)    Neutro Abs 10.9 (*)    All other components within normal limits  LACTIC ACID, PLASMA - Abnormal; Notable for the following components:   Lactic Acid, Venous 2.2 (*)    All other components within normal limits  BASIC METABOLIC PANEL - Abnormal; Notable for the following components:   Sodium 134 (*)    Chloride 97 (*)    Glucose, Bld 329 (*)    BUN 36 (*)    Creatinine, Ser 1.44 (*)  Calcium 8.5 (*)    GFR, Estimated 56 (*)    All other components within normal limits  HEPATIC FUNCTION PANEL - Abnormal; Notable for the following components:   Albumin 2.9 (*)    AST 81 (*)    ALT 81 (*)    Bilirubin, Direct 0.3 (*)    All other components within normal limits  CULTURE, BLOOD (ROUTINE X 2)  CULTURE, BLOOD (ROUTINE X 2)  LACTIC ACID, PLASMA  C-REACTIVE PROTEIN  SEDIMENTATION RATE    EKG None  Radiology DG Foot Complete Right  Result Date: 12/24/2021 CLINICAL DATA:  Pain and swelling EXAM: RIGHT FOOT COMPLETE - 3+ VIEW COMPARISON:  None Available. FINDINGS: No fracture or dislocation is seen. No focal lytic lesions are seen. Plantar spur is seen in calcaneus. There is linear smooth marginated calcification along the plantar aspect of anterior calcaneus, possibly ligament calcification from previous injury. Bony spurs are noted in the dorsal aspect of intertarsal and tarsometatarsal joints. Small bony spurs are seen in first metatarsophalangeal joint. Small smoothly marginated calcification adjacent to the medial aspect of first metatarsophalangeal joint may be residual from previous injury. There are no opaque foreign bodies. The soft tissue swelling along the dorsal aspect. IMPRESSION: No recent fracture or dislocation is seen. There are no focal lytic lesions. Degenerative changes are noted in multiple joints. Electronically Signed   By: Elmer Picker M.D.   On: 12/24/2021 15:37   DG Ankle Complete Right  Result Date:  12/24/2021 CLINICAL DATA:  Swelling EXAM: RIGHT ANKLE - COMPLETE 3 VIEW COMPARISON:  None Available. FINDINGS: There is no evidence of fracture, dislocation, or joint effusion. Mild degenerative changes of the tibiotalar joint and partially visualized midfoot. Prominent Achilles tendon enthesophyte. Soft tissue edema. IMPRESSION: 1. No acute osseous abnormality. 2. Soft tissue edema. Electronically Signed   By: Yetta Glassman M.D.   On: 12/24/2021 15:04    Procedures Procedures    Medications Ordered in ED Medications  ceFAZolin (ANCEF) IVPB 2g/100 mL premix (has no administration in time range)  lactated ringers bolus 1,000 mL (1,000 mLs Intravenous New Bag/Given 12/24/21 1650)    ED Course/ Medical Decision Making/ A&P Clinical Course as of 12/24/21 1745  Tue Dec 24, 2021  1544 Patient here with feeling feverish nauseous right ankle pain in the setting of a wound of his right lower leg.  Has been seen in urgent care where they checked a uric acid.  Sent him here for concerns of may need some antibiotics. [MB]    Clinical Course User Index [MB] Hayden Rasmussen, MD                           Medical Decision Making Amount and/or Complexity of Data Reviewed Labs: ordered. Radiology: ordered.  Risk Prescription drug management. Decision regarding hospitalization.   Patient presents with right lower extremity pain and rash/wound.  Differential includes but not limited to cellulitis, osteomyelitis, gout, septic joint.  Patient has intact pulses DP and PT, there is tenderness to palpation some erythema and warmth.  Cellulitis to medial aspect of foot versus gout.  Tolerates passive ROM so I do not think this is a septic joint.  External records reviewed as documented in HPI.  I reviewed the imaging from previous, no acute process.  Patient had a leukocytosis of 15 on 12/22/21.  Uric acid level is nondetectable, COVID and flu negative.  I ordered and reviewed laboratory work-up. Per my  interpretation:  Initial lactic acid was elevated at 2.2.   White count 12.8 with left shift.  This meets SIRS criteria, blood cultures initiated as well as fluid and IV antibiotic for suspected cellulitis infection. Mild transaminitis on the hepatic management function panel BMP is without any gross electrolyte derangement the patient does have a mild AKI of 1.44 which is roughly baseline.  He is hyperglycemic, BUN is slightly elevated but patient is not in DKA. CRP pending Sed rate pending Blood cultures obtained and pending Repeat lactic acid downtrending to 1.8.  I ordered and reviewed plain film of right ankle and foot.  Soft tissue swelling but no acute fracture or bony osteomyelitis.  Agree with radiologist interpretation.  No I ordered a bolus of lactated Ringer's.  Also ordered Ancef 2 g IV.  Initially I was considering admission for IV antibiotics.  Patient's lactic is improving and the white count is still elevated although it is decreased compared to 3 days ago.  Engaged in shared decision-making with patient and wife, they are agreeable to trial of oral antibiotics and strict return precautions but would prefer to come into the hospital given this is the third visit for this condition and it keeps getting worse in the outpatient setting.  I will consult hospitalist service to see if patient can be admitted for IV antibiotics for suspected cellulitis of his foot.         Final Clinical Impression(s) / ED Diagnoses Final diagnoses:  Cellulitis of right foot    Rx / DC Orders ED Discharge Orders     None         Sherrill Raring, PA-C 12/24/21 1745    Hayden Rasmussen, MD 12/25/21 1234

## 2021-12-24 NOTE — Assessment & Plan Note (Signed)
CKD 3A.  Creatinine 1.4, at baseline.

## 2021-12-24 NOTE — Assessment & Plan Note (Signed)
Chronic nonhealing ulcer to left toe, started as a callus.  Patient is unsure of the duration of this ulcer.  Reports persistence of ulcer, follows with wound care. Ulcer has some purulence, but no surrounding cellulitis.  ESR markedly elevated at 117. -Obtain x-rays of the left foot -Wound care consult

## 2021-12-24 NOTE — Assessment & Plan Note (Signed)
Glucose 329.  He was prescribed a course of steroids for possible gout.  He is on semaglutide only. - HgbA1c - SSi- M -Hold semaglutide for now

## 2021-12-24 NOTE — Assessment & Plan Note (Signed)
Resume allopurinol for now, hold off on further steroids.

## 2021-12-24 NOTE — Assessment & Plan Note (Signed)
Stable.  On 4 blood pressure medications -Reports compliance with hydralazine, HCTZ, lisinopril and metoprolol, will resume

## 2021-12-24 NOTE — Assessment & Plan Note (Signed)
Resume bupropion 
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
## 2021-12-24 NOTE — Progress Notes (Signed)
Talked with patient about CPAP for QHS, pt states "he tried a while back and couldn't wear and sleep with that thing on his nose" I informed that MD had ordered for him to wear/try one for while in hospital, patient refused. I informed patient if he changes his mind to be sure to let us know and machine will be brought to him.

## 2021-12-25 ENCOUNTER — Inpatient Hospital Stay (HOSPITAL_COMMUNITY): Payer: 59

## 2021-12-25 DIAGNOSIS — L03115 Cellulitis of right lower limb: Secondary | ICD-10-CM | POA: Diagnosis not present

## 2021-12-25 LAB — CBC
HCT: 35 % — ABNORMAL LOW (ref 39.0–52.0)
Hemoglobin: 11.7 g/dL — ABNORMAL LOW (ref 13.0–17.0)
MCH: 33 pg (ref 26.0–34.0)
MCHC: 33.4 g/dL (ref 30.0–36.0)
MCV: 98.6 fL (ref 80.0–100.0)
Platelets: 346 10*3/uL (ref 150–400)
RBC: 3.55 MIL/uL — ABNORMAL LOW (ref 4.22–5.81)
RDW: 13.1 % (ref 11.5–15.5)
WBC: 12.2 10*3/uL — ABNORMAL HIGH (ref 4.0–10.5)
nRBC: 0 % (ref 0.0–0.2)

## 2021-12-25 LAB — HEMOGLOBIN A1C
Hgb A1c MFr Bld: 5.4 % (ref 4.8–5.6)
Mean Plasma Glucose: 108.28 mg/dL

## 2021-12-25 LAB — MRSA NEXT GEN BY PCR, NASAL: MRSA by PCR Next Gen: NOT DETECTED

## 2021-12-25 LAB — BASIC METABOLIC PANEL
Anion gap: 8 (ref 5–15)
BUN: 32 mg/dL — ABNORMAL HIGH (ref 6–20)
CO2: 27 mmol/L (ref 22–32)
Calcium: 8.2 mg/dL — ABNORMAL LOW (ref 8.9–10.3)
Chloride: 102 mmol/L (ref 98–111)
Creatinine, Ser: 1.21 mg/dL (ref 0.61–1.24)
GFR, Estimated: >60 mL/min (ref >60)
Glucose, Bld: 109 mg/dL — ABNORMAL HIGH (ref 70–99)
Potassium: 3.6 mmol/L (ref 3.5–5.1)
Sodium: 137 mmol/L (ref 135–145)

## 2021-12-25 LAB — GLUCOSE, CAPILLARY
Glucose-Capillary: 133 mg/dL — ABNORMAL HIGH (ref 70–99)
Glucose-Capillary: 213 mg/dL — ABNORMAL HIGH (ref 70–99)
Glucose-Capillary: 110 mg/dL — ABNORMAL HIGH (ref 70–99)
Glucose-Capillary: 173 mg/dL — ABNORMAL HIGH (ref 70–99)

## 2021-12-25 LAB — HIV ANTIBODY (ROUTINE TESTING W REFLEX): HIV Screen 4th Generation wRfx: NONREACTIVE

## 2021-12-25 MED ORDER — MEDIHONEY WOUND/BURN DRESSING EX PSTE
1.0000 | PASTE | Freq: Every day | CUTANEOUS | Status: DC
Start: 2021-12-25 — End: 2021-12-28
  Administered 2021-12-25 – 2021-12-28 (×4): 1 via TOPICAL
  Filled 2021-12-25: qty 44

## 2021-12-25 NOTE — Consult Note (Signed)
WOC Nurse Consult Note: Reason for Consult:Bilateral toe wounds, L>R, full thickness. Patent is followed by the outpatient wound care center in [place] and was last seen in that setting on 7/24//23. I will continue the POC implemented by Dr. Ladona Horns. Photos taken in the ED yesterday are appreciated. Past Medical history is reviewed by this Probation officer. Wound type:Neuropathic, infectious  Pressure Injury POA: N/A Measurement:To be obtained by Bedside RN today with first dressing change. Wound bed:Red, moist Drainage (amount, consistency, odor) small serous Periwound:mild maceration Dressing procedure/placement/frequency: I will continue the POC of daily cleanse with soap and water, rinse and dry followed by application of a topical antimicrobial to enhance autolysis and promote healing, MediHoney. This will be topped with a damp saline gauze and covered with dry gauze prior to securement with conform bandaging/paper tape.   If desired, consultation with podiatric medicine can be considered while in house for further input into the POC.  Recommend continued follow up at the [place] post discharge.  [facility] nursing team will not follow, but will remain available to this patient, the nursing and medical teams.  Please re-consult if needed.  Thank you for inviting [place] to participate in this patient's Plan of Care.  Maudie Flakes, MSN, RN, CNS, [place], Serita Grammes, [employer], [employer] phone:  [phone]

## 2021-12-25 NOTE — TOC Progression Note (Signed)
Transition of Care [facility]) - Progression Note    Patient Details  Name: VERDON FERRANTE MRN: [id number] Date of Birth: [birth year]  Transition of Care [facility]) CM/SW Contact  Salome Arnt, [place] Phone Number: 12/25/2021, 1:32 PM  Clinical Narrative:   Transition of Care [facility]) Screening Note   Patient Details  Name: SAIR FAULCON Date of Birth: [birth year]   Transition of Care (TOC) CM/SW Contact:    Salome Arnt, [place] Phone Number: 12/25/2021, 1:32 PM    Transition of Care Department [facility]) has reviewed patient and no TOC needs have been identified at this time. We will continue to monitor patient advancement through interdisciplinary progression rounds. If new patient transition needs arise, please place a TOC consult.         Barriers to Discharge: Continued Medical Work up  Expected Discharge Plan and Services                                                 Social Determinants of Health (SDOH) Interventions    Readmission Risk Interventions     No data to display

## 2021-12-25 NOTE — Progress Notes (Signed)
PROGRESS NOTE    Anthony FINKLER  BZJ:[id number] DOB: 1961/02/11 DOA: 12/24/2021 PCP: [name], DO   Brief Narrative: 61 year old male with medical history significant for type 2 diabetes, hypertension, gout, CKD admitted with right lower extremity right foot swelling and redness started a week and a half ago.  He gives a history of trauma to the right foot while he was trying to cut wood and the wood fell on his right foot.  Was started on colchicine thinking this is a gout flare but he continued with increased swelling and redness.  He was given Rocephin shot in the urgent care on 12/22/2021.  Urgent care called him and asked her to come to the hospital due to leukocytosis 15.9.  Patient also had fever at home 101.6.  The time of admission sed rate was 117 white count 12.8 lactic acid 2.2.  X-ray of the right foot showed no fracture or dislocation. Doppler shows no evidence of DVT.  Assessment & Plan:   Principal Problem:   Cellulitis of right leg Active Problems:   Diabetic ulcer of left great toe (HCC)   Hyperglycemia   Hypertension associated with diabetes ([place])   T2DM (type 2 diabetes mellitus) ([place])   Gout   Chronic kidney disease   Sleep apnea   Depression   #1 right lower extremity cellulitis secondary to trauma in the setting of type 2 diabetes and gout and chronic nonhealing wound to the left big toe -his leukocytosis has remained about the same with improvement in lactic acidosis.  On Rocephin 2 g IV daily.  His sed rate and CRP is elevated.   Will obtain MRI of the right foot. His right foot is significantly swollen compared to his left foot. He was given a dose of tetanus booster  #2 chronic nonhealing ulcer to the left toe followed by wound care as an outpatient Appreciate wound care team input during this admission. Left foot x-ray shows no evidence of osteomyelitis.  #3 type 2 diabetes  Hemoglobin A1c 5.4  On ssi  #4 depression on bupropion  #5 CKD stage  III AA at baseline  #6 history of gout on allopurinol  #7 history of essential hypertension on multiple medications prior to admission including hydralazine, HCTZ, ACE inhibitor and metoprolol.  #8 hyperlipidemia-on statin  Pressure Injury 12/24/21 Toe (Comment  which one) Right;Posterior Unstageable - Full thickness tissue loss in which the base of the injury is covered by slough (yellow, tan, gray, green or brown) and/or eschar (tan, brown or black) in the wound bed. Smal (Active)  12/24/21 1855  Location: Toe (Comment  which one)  Location Orientation: Right;Posterior  Staging: Unstageable - Full thickness tissue loss in which the base of the injury is covered by slough (yellow, tan, gray, green or brown) and/or eschar (tan, brown or black) in the wound bed.  Wound Description (Comments): Small open wound with black wound bed  Present on Admission: Yes    Estimated body mass index is 34.86 kg/m as calculated from the following:   Height as of this encounter: '6\' 5"'$  (1.956 m).   Weight as of this encounter: 133.4 kg.  DVT prophylaxis: lovenox Code Status:full Family Communication: dw wife Disposition Plan:  Status is: Inpatient Remains inpatient appropriate because: iv antibiotics   Consultants: none  Procedures: none Antimicrobials:  rocephin Subjective:  Wife at bed side  C/o swelling pain to right foot/leg  Objective: Vitals:   12/24/21 2215 12/25/21 0310 12/25/21 0700 12/25/21 0175  BP: 130/76 130/75 125/76 127/79  Pulse: 84 84 75   Resp: '18 20 18   '$ Temp: 98.8 F (37.1 C) 98.6 F (37 C) 98.5 F (36.9 C)   TempSrc:   Oral   SpO2: 97%  98%   Weight:      Height:        Intake/Output Summary (Last 24 hours) at 12/25/2021 1302 Last data filed at 12/25/2021 4782 Gross per 24 hour  Intake 600 ml  Output --  Net 600 ml   Filed Weights   12/24/21 1434  Weight: 133.4 kg    Examination:  General exam: Appears un comfortable Respiratory system: Clear to  auscultation. Respiratory effort normal. Cardiovascular system: S1 & S2 heard, RRR. No JVD, murmurs, rubs, gallops or clicks. No pedal edema. Gastrointestinal system: Abdomen is nondistended, soft and nontender. No organomegaly or masses felt. Normal bowel sounds heard. Central nervous system: Alert and oriented. No focal neurological deficits. Extremities: right foot swollen tender erythematous Skin: No rashes, lesions or ulcers Psychiatry: Judgement and insight appear normal. Mood & affect appropriate.     Data Reviewed: I have personally reviewed following labs and imaging studies  CBC: Recent Labs  Lab 12/22/21 1206 12/24/21 1438 12/25/21 0449  WBC 15.9* 12.8* 12.2*  NEUTROABS 13.3* 10.9*  --   HGB 12.8* 13.1 11.7*  HCT 37.0* 39.5 35.0*  MCV 96 99.5 98.6  PLT 272 295 956   Basic Metabolic Panel: Recent Labs  Lab 12/24/21 1626 12/25/21 0449  NA 134* 137  K 4.7 3.6  CL 97* 102  CO2 28 27  GLUCOSE 329* 109*  BUN 36* 32*  CREATININE 1.44* 1.21  CALCIUM 8.5* 8.2*   GFR: Estimated Creatinine Clearance: 98.1 mL/min (by C-G formula based on SCr of 1.21 mg/dL). Liver Function Tests: Recent Labs  Lab 12/24/21 1626  AST 81*  ALT 81*  ALKPHOS 106  BILITOT 0.8  PROT 7.4  ALBUMIN 2.9*   No results for input(s): "LIPASE", "AMYLASE" in the last 168 hours. No results for input(s): "AMMONIA" in the last 168 hours. Coagulation Profile: No results for input(s): "INR", "PROTIME" in the last 168 hours. Cardiac Enzymes: No results for input(s): "CKTOTAL", "CKMB", "CKMBINDEX", "TROPONINI" in the last 168 hours. BNP (last 3 results) No results for input(s): "PROBNP" in the last 8760 hours. HbA1C: Recent Labs    12/24/21 1438  HGBA1C 5.4   CBG: Recent Labs  Lab 12/24/21 2304 12/25/21 0735 12/25/21 1107  GLUCAP 202* 133* 213*   Lipid Profile: No results for input(s): "CHOL", "HDL", "LDLCALC", "TRIG", "CHOLHDL", "LDLDIRECT" in the last 72 hours. Thyroid Function  Tests: No results for input(s): "TSH", "T4TOTAL", "FREET4", "T3FREE", "THYROIDAB" in the last 72 hours. Anemia Panel: No results for input(s): "VITAMINB12", "FOLATE", "FERRITIN", "TIBC", "IRON", "RETICCTPCT" in the last 72 hours. Sepsis Labs: Recent Labs  Lab 12/24/21 1439 12/24/21 1626  LATICACIDVEN 2.2* 1.8    Recent Results (from the past 240 hour(s))  Novel Coronavirus, NAA ([facility])     Status: None   Collection Time: 12/19/21  9:47 AM   Specimen: Nasopharyngeal(NP) swabs in vial transport medium  Result Value Ref Range Status   SARS-CoV-2, NAA Not Detected Not Detected Final    Comment: This nucleic acid amplification test was developed and its performance characteristics determined by [employer]. Nucleic acid amplification tests include RT-PCR and TMA. This test has not been FDA cleared or approved. This test has been authorized by FDA under an Emergency Use Authorization (EUA). This test is  only authorized for the duration of time the declaration that circumstances exist justifying the authorization of the emergency use of in vitro diagnostic tests for detection of SARS-CoV-2 virus and/or diagnosis of COVID-19 infection under section 564(b)(1) of the Act, 21 U.S.C. 709GGE-3(M) (1), unless the authorization is terminated or revoked sooner. When diagnostic testing is negative, the possibility of a false negative result should be considered in the context of a patient's recent exposures and the presence of clinical signs and symptoms consistent with COVID-19. An individual without symptoms of COVID-19 and who is not shedding SARS-CoV-2 virus wo uld expect to have a negative (not detected) result in this assay.   Blood culture (routine x 2)     Status: None (Preliminary result)   Collection Time: 12/24/21  4:27 PM   Specimen: BLOOD  Result Value Ref Range Status   Specimen Description BLOOD BLOOD RIGHT ARM  Final   Special Requests   Final    BOTTLES DRAWN  AEROBIC AND ANAEROBIC Blood Culture results may not be optimal due to an excessive volume of blood received in culture bottles   Culture   Final    NO GROWTH < 24 HOURS Performed at [facility], [address], [place], [place] [address]    Report Status PENDING  Incomplete  Blood culture (routine x 2)     Status: None (Preliminary result)   Collection Time: 12/24/21  4:27 PM   Specimen: BLOOD  Result Value Ref Range Status   Specimen Description BLOOD BLOOD RIGHT HAND  Final   Special Requests   Final    BOTTLES DRAWN AEROBIC AND ANAEROBIC Blood Culture results may not be optimal due to an excessive volume of blood received in culture bottles   Culture   Final    NO GROWTH < 24 HOURS Performed at [facility], [address], [place], [place] [address]    Report Status PENDING  Incomplete         Radiology Studies: US Venous Img Lower Unilateral Right (DVT)  Result Date: 12/25/2021 CLINICAL DATA:  Acute right lower extremity swelling. EXAM: Right LOWER EXTREMITY VENOUS DOPPLER ULTRASOUND TECHNIQUE: Gray-scale sonography with compression, as well as color and duplex ultrasound, were performed to evaluate the deep venous system(s) from the level of the common femoral vein through the popliteal and proximal calf veins. COMPARISON:  Oct 04, 2019. FINDINGS: VENOUS Normal compressibility of the common femoral, superficial femoral, and popliteal veins, as well as the visualized calf veins. Visualized portions of profunda femoral vein and great saphenous vein unremarkable. No filling defects to suggest DVT on grayscale or color Doppler imaging. Doppler waveforms show normal direction of venous flow, normal respiratory plasticity and response to augmentation. Limited views of the contralateral common femoral vein are unremarkable. OTHER None. Limitations: none IMPRESSION: Negative. Electronically Signed   By: Marijo Conception M.D.   On: 12/25/2021 10:10   DG Foot 2 Views Left  Result Date:  12/24/2021 CLINICAL DATA:  Left great toe diabetic ulcer EXAM: LEFT FOOT - 2 VIEW COMPARISON:  None Available. FINDINGS: Normal alignment. Remote healed fracture of the distal left fifth metatarsal. No acute fracture or dislocation. Osseous structures are diffusely osteopenic. No abnormal periosteal reaction or osseous erosion. Large superior calcaneal spur. Shallow ulcer noted along the plantar aspect of the left great toe. IMPRESSION: Shallow ulcer along the plantar aspect of the left great toe. No radiographic evidence of osteomyelitis. Electronically Signed   By: Fidela Salisbury M.D.   On: 12/24/2021  21:15   DG Foot Complete Right  Result Date: 12/24/2021 CLINICAL DATA:  Pain and swelling EXAM: RIGHT FOOT COMPLETE - 3+ VIEW COMPARISON:  None Available. FINDINGS: No fracture or dislocation is seen. No focal lytic lesions are seen. Plantar spur is seen in calcaneus. There is linear smooth marginated calcification along the plantar aspect of anterior calcaneus, possibly ligament calcification from previous injury. Bony spurs are noted in the dorsal aspect of intertarsal and tarsometatarsal joints. Small bony spurs are seen in first metatarsophalangeal joint. Small smoothly marginated calcification adjacent to the medial aspect of first metatarsophalangeal joint may be residual from previous injury. There are no opaque foreign bodies. The soft tissue swelling along the dorsal aspect. IMPRESSION: No recent fracture or dislocation is seen. There are no focal lytic lesions. Degenerative changes are noted in multiple joints. Electronically Signed   By: Elmer Picker M.D.   On: 12/24/2021 15:37   DG Ankle Complete Right  Result Date: 12/24/2021 CLINICAL DATA:  Swelling EXAM: RIGHT ANKLE - COMPLETE 3 VIEW COMPARISON:  None Available. FINDINGS: There is no evidence of fracture, dislocation, or joint effusion. Mild degenerative changes of the tibiotalar joint and partially visualized midfoot. Prominent Achilles  tendon enthesophyte. Soft tissue edema. IMPRESSION: 1. No acute osseous abnormality. 2. Soft tissue edema. Electronically Signed   By: Yetta Glassman M.D.   On: 12/24/2021 15:04        Scheduled Meds:  allopurinol  300 mg Oral Daily   aspirin  81 mg Oral Daily   atorvastatin  20 mg Oral Daily   buPROPion  300 mg Oral Daily   enoxaparin (LOVENOX) injection  40 mg Subcutaneous Q24H   furosemide  40 mg Oral Daily   gabapentin  600 mg Oral TID   hydrALAZINE  50 mg Oral TID   hydrochlorothiazide  12.5 mg Oral Daily   insulin aspart  0-15 Units Subcutaneous TID WC   insulin aspart  0-5 Units Subcutaneous QHS   leptospermum manuka honey  1 Application Topical Daily   lisinopril  40 mg Oral Daily   metoprolol tartrate  200 mg Oral BID   Tdap  0.5 mL Intramuscular Once   Continuous Infusions:  cefTRIAXone (ROCEPHIN)  IV 2 g (12/24/21 2217)     LOS: 1 day    Time spent: 37 min  Georgette Shell, MD 12/25/2021, 1:02 PM

## 2021-12-25 NOTE — Progress Notes (Signed)
Patient has rested well. Patient has only received tylenol for pain.  No new issues for patient during shift.

## 2021-12-26 ENCOUNTER — Encounter (HOSPITAL_COMMUNITY): Payer: Self-pay | Admitting: Internal Medicine

## 2021-12-26 ENCOUNTER — Inpatient Hospital Stay (HOSPITAL_COMMUNITY): Payer: 59

## 2021-12-26 DIAGNOSIS — L03115 Cellulitis of right lower limb: Secondary | ICD-10-CM | POA: Diagnosis not present

## 2021-12-26 LAB — SYNOVIAL CELL COUNT + DIFF, W/ CRYSTALS
Eosinophils-Synovial: 0 % (ref 0–1)
Lymphocytes-Synovial Fld: 2 % (ref 0–20)
Monocyte-Macrophage-Synovial Fluid: 7 % — ABNORMAL LOW (ref 50–90)
Neutrophil, Synovial: 91 % — ABNORMAL HIGH (ref 0–25)
WBC, Synovial: 55000 /mm3 — ABNORMAL HIGH (ref 0–200)

## 2021-12-26 LAB — GLUCOSE, CAPILLARY
Glucose-Capillary: 162 mg/dL — ABNORMAL HIGH (ref 70–99)
Glucose-Capillary: 179 mg/dL — ABNORMAL HIGH (ref 70–99)
Glucose-Capillary: 130 mg/dL — ABNORMAL HIGH (ref 70–99)
Glucose-Capillary: 163 mg/dL — ABNORMAL HIGH (ref 70–99)

## 2021-12-26 MED ORDER — LIDOCAINE HCL (PF) 2 % IJ SOLN
INTRAMUSCULAR | Status: AC
  Filled 2021-12-26: qty 10

## 2021-12-26 MED ORDER — COLCHICINE 0.6 MG PO TABS
0.6000 mg | ORAL_TABLET | Freq: Four times a day (QID) | ORAL | Status: AC
  Administered 2021-12-26 (×2): 0.6 mg via ORAL
  Filled 2021-12-26 (×2): qty 1

## 2021-12-26 NOTE — Progress Notes (Signed)
Patient rested well, no complaints of pain this shift.  Pain had mild fever  (99.6) at beginning of shift, Tylenol given.
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
## 2021-12-26 NOTE — Progress Notes (Signed)
PROGRESS NOTE    Anthony Logan  SWF:[id number] DOB: 05-31-1960 DOA: 12/24/2021 PCP: [name], DO   Brief Narrative: 61 year old male with medical history significant for type 2 diabetes, hypertension, gout, CKD admitted with right lower extremity right foot swelling and redness started a week and a half ago.  He gives a history of trauma to the right foot while he was trying to cut wood and the wood fell on his right foot.  Was started on colchicine thinking this is a gout flare but he continued with increased swelling and redness.  He was given Rocephin shot in the urgent care on 12/22/2021.  Urgent care called him and asked her to come to the hospital due to leukocytosis 15.9.  Patient also had fever at home 101.6.  The time of admission sed rate was 117 white count 12.8 lactic acid 2.2.  X-ray of the right foot showed no fracture or dislocation. Doppler shows no evidence of DVT.  Assessment & Plan:   Principal Problem:   Cellulitis of right leg Active Problems:   Diabetic ulcer of left great toe (HCC)   Hyperglycemia   Hypertension associated with diabetes ([place])   T2DM (type 2 diabetes mellitus) ([place])   Gout   Chronic kidney disease   Sleep apnea   Depression   #1 right lower extremity cellulitis secondary to trauma in the setting of type 2 diabetes and gout and chronic nonhealing wound to the left big toe -his leukocytosis has remained about the same with improvement in lactic acidosis.  On Rocephin 2 g IV daily.  His sed rate and CRP is elevated.   MRI of the right foot -polyarticular arthropathy involving the tibiotalar and posterior subtalar joints with large joint effusion and synovitis that could reflect inflammatory and crystalline arthropathy or septic arthritis.  Moderate flexor and mild peroneal tenosynovitis.  Mild to moderate posterior tibial and Achilles tendinosis. IR consulted for arthrocentesis and sending the fluid for culture Gram stain and cell count. His  right foot is significantly swollen compared to his left foot. He was given a dose of tetanus booster Will continue allopurinol and add colchicine  #2 chronic nonhealing ulcer to the left toe followed by wound care as an outpatient Appreciate wound care team input during this admission. Left foot x-ray shows no evidence of osteomyelitis.  #3 type 2 diabetes  Hemoglobin A1c 5.4  On ssi  #4 depression on bupropion  #5 CKD stage III AA at baseline  #6 history of gout on allopurinol  #7 history of essential hypertension on multiple medications prior to admission including hydralazine, HCTZ, ACE inhibitor and metoprolol.  #8 hyperlipidemia-on statin  Pressure Injury 12/24/21 Toe (Comment  which one) Right;Posterior Unstageable - Full thickness tissue loss in which the base of the injury is covered by slough (yellow, tan, gray, green or brown) and/or eschar (tan, brown or black) in the wound bed. Smal (Active)  12/24/21 1855  Location: Toe (Comment  which one)  Location Orientation: Right;Posterior  Staging: Unstageable - Full thickness tissue loss in which the base of the injury is covered by slough (yellow, tan, gray, green or brown) and/or eschar (tan, brown or black) in the wound bed.  Wound Description (Comments): Small open wound with black wound bed  Present on Admission: Yes  Dressing Type Gauze (Comment) 12/25/21 2300    Estimated body mass index is 34.86 kg/m as calculated from the following:   Height as of this encounter: '6\' 5"'$  (1.956 m).  Weight as of this encounter: 133.4 kg.  DVT prophylaxis: lovenox Code Status:full Family Communication: dw wife Disposition Plan:  Status is: Inpatient Remains inpatient appropriate because: iv antibiotics   Consultants: none  Procedures: none Antimicrobials:  rocephin Subjective:  Wife at bed side  C/o swelling pain to right foot/leg  Objective: Vitals:   12/25/21 2125 12/26/21 0340 12/26/21 0859 12/26/21 1228  BP:  139/84 135/72 121/75 126/77  Pulse: 87 87  79  Resp: '20 20  19  '$ Temp: 99.4 F (37.4 C) 98.2 F (36.8 C)  97.9 F (36.6 C)  TempSrc:      SpO2: 94% 97%  100%  Weight:      Height:        Intake/Output Summary (Last 24 hours) at 12/26/2021 1354 Last data filed at 12/26/2021 1234 Gross per 24 hour  Intake 460 ml  Output 2 ml  Net 458 ml    Filed Weights   12/24/21 1434  Weight: 133.4 kg    Examination:  General exam: Appears un comfortable Respiratory system: Clear to auscultation. Respiratory effort normal. Cardiovascular system: S1 & S2 heard, RRR. No JVD, murmurs, rubs, gallops or clicks. No pedal edema. Gastrointestinal system: Abdomen is nondistended, soft and nontender. No organomegaly or masses felt. Normal bowel sounds heard. Central nervous system: Alert and oriented. No focal neurological deficits. Extremities: right foot swollen tender erythematous Skin: No rashes, lesions or ulcers Psychiatry: Judgement and insight appear normal. Mood & affect appropriate.     Data Reviewed: I have personally reviewed following labs and imaging studies  CBC: Recent Labs  Lab 12/22/21 1206 12/24/21 1438 12/25/21 0449  WBC 15.9* 12.8* 12.2*  NEUTROABS 13.3* 10.9*  --   HGB 12.8* 13.1 11.7*  HCT 37.0* 39.5 35.0*  MCV 96 99.5 98.6  PLT 272 295 275    Basic Metabolic Panel: Recent Labs  Lab 12/24/21 1626 12/25/21 0449  NA 134* 137  K 4.7 3.6  CL 97* 102  CO2 28 27  GLUCOSE 329* 109*  BUN 36* 32*  CREATININE 1.44* 1.21  CALCIUM 8.5* 8.2*    GFR: Estimated Creatinine Clearance: 98.1 mL/min (by C-G formula based on SCr of 1.21 mg/dL). Liver Function Tests: Recent Labs  Lab 12/24/21 1626  AST 81*  ALT 81*  ALKPHOS 106  BILITOT 0.8  PROT 7.4  ALBUMIN 2.9*    No results for input(s): "LIPASE", "AMYLASE" in the last 168 hours. No results for input(s): "AMMONIA" in the last 168 hours. Coagulation Profile: No results for input(s): "INR", "PROTIME" in the  last 168 hours. Cardiac Enzymes: No results for input(s): "CKTOTAL", "CKMB", "CKMBINDEX", "TROPONINI" in the last 168 hours. BNP (last 3 results) No results for input(s): "PROBNP" in the last 8760 hours. HbA1C: Recent Labs    12/24/21 1438  HGBA1C 5.4    CBG: Recent Labs  Lab 12/25/21 1107 12/25/21 1646 12/25/21 2126 12/26/21 0738 12/26/21 1107  GLUCAP 213* 110* 173* 162* 179*    Lipid Profile: No results for input(s): "CHOL", "HDL", "LDLCALC", "TRIG", "CHOLHDL", "LDLDIRECT" in the last 72 hours. Thyroid Function Tests: No results for input(s): "TSH", "T4TOTAL", "FREET4", "T3FREE", "THYROIDAB" in the last 72 hours. Anemia Panel: No results for input(s): "VITAMINB12", "FOLATE", "FERRITIN", "TIBC", "IRON", "RETICCTPCT" in the last 72 hours. Sepsis Labs: Recent Labs  Lab 12/24/21 1439 12/24/21 1626  LATICACIDVEN 2.2* 1.8     Recent Results (from the past 240 hour(s))  Novel Coronavirus, NAA ([facility])     Status: None   Collection Time:  12/19/21  9:47 AM   Specimen: Nasopharyngeal(NP) swabs in vial transport medium  Result Value Ref Range Status   SARS-CoV-2, NAA Not Detected Not Detected Final    Comment: This nucleic acid amplification test was developed and its performance characteristics determined by [employer]. Nucleic acid amplification tests include RT-PCR and TMA. This test has not been FDA cleared or approved. This test has been authorized by FDA under an Emergency Use Authorization (EUA). This test is only authorized for the duration of time the declaration that circumstances exist justifying the authorization of the emergency use of in vitro diagnostic tests for detection of SARS-CoV-2 virus and/or diagnosis of COVID-19 infection under section 564(b)(1) of the Act, 21 U.S.C. 841YSA-6(T) (1), unless the authorization is terminated or revoked sooner. When diagnostic testing is negative, the possibility of a false negative result should be  considered in the context of a patient's recent exposures and the presence of clinical signs and symptoms consistent with COVID-19. An individual without symptoms of COVID-19 and who is not shedding SARS-CoV-2 virus wo uld expect to have a negative (not detected) result in this assay.   Blood culture (routine x 2)     Status: None (Preliminary result)   Collection Time: 12/24/21  4:27 PM   Specimen: BLOOD  Result Value Ref Range Status   Specimen Description BLOOD BLOOD RIGHT ARM  Final   Special Requests   Final    BOTTLES DRAWN AEROBIC AND ANAEROBIC Blood Culture results may not be optimal due to an excessive volume of blood received in culture bottles   Culture   Final    NO GROWTH 2 DAYS Performed at [facility], [address] [place], [place] [address]    Report Status PENDING  Incomplete  Blood culture (routine x 2)     Status: None (Preliminary result)   Collection Time: 12/24/21  4:27 PM   Specimen: BLOOD  Result Value Ref Range Status   Specimen Description BLOOD BLOOD RIGHT HAND  Final   Special Requests   Final    BOTTLES DRAWN AEROBIC AND ANAEROBIC Blood Culture results may not be optimal due to an excessive volume of blood received in culture bottles   Culture   Final    NO GROWTH 2 DAYS Performed at [facility], [address], [place], [place] [address]    Report Status PENDING  Incomplete  MRSA Next Gen by PCR, Nasal     Status: None   Collection Time: 12/25/21 10:20 AM   Specimen: Nasal Mucosa; Nasal Swab  Result Value Ref Range Status   MRSA by PCR Next Gen NOT DETECTED NOT DETECTED Final    Comment: (NOTE) The GeneXpert MRSA Assay (FDA approved for NASAL specimens only), is one component of a comprehensive MRSA colonization surveillance program. It is not intended to diagnose MRSA infection nor to guide or monitor treatment for MRSA infections. Test performance is not FDA approved in patients less than 79 years old. Performed at [facility], [address], [place], [place] [address]          Radiology Studies: US Guided Needle Placement  Result Date: 12/26/2021 CLINICAL DATA:  Ultrasound was provided for use by the ordering physician.  No provider Interpretation or professional fees incurred.    MR HEEL RIGHT WO CONTRAST  Result Date: 12/25/2021 CLINICAL DATA:  Right lower leg infection. Medial hindfoot erythema and swelling. EXAM: MR OF THE RIGHT HEEL WITHOUT CONTRAST TECHNIQUE: Multiplanar, multisequence MR imaging of the right ankle  was performed. No intravenous contrast was administered. COMPARISON:  Right foot and ankle x-rays from yesterday. FINDINGS: TENDONS Peroneal: Peroneal longus tendon intact. Peroneal brevis intact. Small amount of fluid in the peroneal tendon sheaths. Posteromedial: Posterior tibial tendon intact with fusiform enlargement and tendinosis. Small amount of fluid in the tendon sheath. Flexor digitorum longus tendon intact with moderate amount of fluid in the tendon sheath. Flexor hallucis longus tendon intact with moderate amount of fluid in the tendon sheath. Anterior: Tibialis anterior tendon intact. Extensor hallucis longus tendon intact. Extensor digitorum longus tendon intact. Achilles: Intact. Mild-to-moderate tendon thickening with increased intermediate signal. Plantar Fascia: Intact. LIGAMENTS Lateral: Anterior talofibular ligament intact. Calcaneofibular ligament intact. Posterior talofibular ligament intact. Anterior and posterior tibiofibular ligaments intact. Medial: Deltoid ligament intact. Spring ligament intact. CARTILAGE Ankle Joint: Large joint effusion with synovitis. Small full-thickness cartilage defect over the posterior tibial plafond. Subtalar Joints/Sinus Tarsi: Large posterior subtalar joint effusion with synovitis. Near complete loss of the normal fat within the sinus tarsi with prominent marrow edema in the overlying lateral talar process and adjacent calcaneus. Bones: Periarticular marrow edema  about the tibiotalar and subtalar joints. Small calcaneocuboid and talonavicular joint effusions. No acute fracture or dislocation. No suspicious bone lesion. Mild midfoot osteoarthritis. Soft Tissue: Diffuse soft tissue swelling. No soft tissue mass or fluid collection. IMPRESSION: 1. Polyarticular arthropathy involving the tibiotalar and posterior subtalar joints with large joint effusions and synovitis that could reflect inflammatory/crystal arthropathy or septic arthritis. Correlation with arthrocentesis is recommended. 2. Moderate flexor and mild peroneal tenosynovitis. 3. Mild to moderate posterior tibial and Achilles tendinosis. Electronically Signed   By: Titus Dubin M.D.   On: 12/25/2021 16:23   US Venous Img Lower Unilateral Right (DVT)  Result Date: 12/25/2021 CLINICAL DATA:  Acute right lower extremity swelling. EXAM: Right LOWER EXTREMITY VENOUS DOPPLER ULTRASOUND TECHNIQUE: Gray-scale sonography with compression, as well as color and duplex ultrasound, were performed to evaluate the deep venous system(s) from the level of the common femoral vein through the popliteal and proximal calf veins. COMPARISON:  Oct 04, 2019. FINDINGS: VENOUS Normal compressibility of the common femoral, superficial femoral, and popliteal veins, as well as the visualized calf veins. Visualized portions of profunda femoral vein and great saphenous vein unremarkable. No filling defects to suggest DVT on grayscale or color Doppler imaging. Doppler waveforms show normal direction of venous flow, normal respiratory plasticity and response to augmentation. Limited views of the contralateral common femoral vein are unremarkable. OTHER None. Limitations: none IMPRESSION: Negative. Electronically Signed   By: Marijo Conception M.D.   On: 12/25/2021 10:10   DG Foot 2 Views Left  Result Date: 12/24/2021 CLINICAL DATA:  Left great toe diabetic ulcer EXAM: LEFT FOOT - 2 VIEW COMPARISON:  None Available. FINDINGS: Normal alignment.  Remote healed fracture of the distal left fifth metatarsal. No acute fracture or dislocation. Osseous structures are diffusely osteopenic. No abnormal periosteal reaction or osseous erosion. Large superior calcaneal spur. Shallow ulcer noted along the plantar aspect of the left great toe. IMPRESSION: Shallow ulcer along the plantar aspect of the left great toe. No radiographic evidence of osteomyelitis. Electronically Signed   By: Fidela Salisbury M.D.   On: 12/24/2021 21:15   DG Foot Complete Right  Result Date: 12/24/2021 CLINICAL DATA:  Pain and swelling EXAM: RIGHT FOOT COMPLETE - 3+ VIEW COMPARISON:  None Available. FINDINGS: No fracture or dislocation is seen. No focal lytic lesions are seen. Plantar spur is seen in calcaneus. There is linear smooth  marginated calcification along the plantar aspect of anterior calcaneus, possibly ligament calcification from previous injury. Bony spurs are noted in the dorsal aspect of intertarsal and tarsometatarsal joints. Small bony spurs are seen in first metatarsophalangeal joint. Small smoothly marginated calcification adjacent to the medial aspect of first metatarsophalangeal joint may be residual from previous injury. There are no opaque foreign bodies. The soft tissue swelling along the dorsal aspect. IMPRESSION: No recent fracture or dislocation is seen. There are no focal lytic lesions. Degenerative changes are noted in multiple joints. Electronically Signed   By: Elmer Picker M.D.   On: 12/24/2021 15:37   DG Ankle Complete Right  Result Date: 12/24/2021 CLINICAL DATA:  Swelling EXAM: RIGHT ANKLE - COMPLETE 3 VIEW COMPARISON:  None Available. FINDINGS: There is no evidence of fracture, dislocation, or joint effusion. Mild degenerative changes of the tibiotalar joint and partially visualized midfoot. Prominent Achilles tendon enthesophyte. Soft tissue edema. IMPRESSION: 1. No acute osseous abnormality. 2. Soft tissue edema. Electronically Signed   By: Yetta Glassman M.D.   On: 12/24/2021 15:04        Scheduled Meds:  allopurinol  300 mg Oral Daily   aspirin  81 mg Oral Daily   atorvastatin  20 mg Oral Daily   buPROPion  300 mg Oral Daily   colchicine  0.6 mg Oral Q6H   enoxaparin (LOVENOX) injection  40 mg Subcutaneous Q24H   gabapentin  600 mg Oral TID   hydrALAZINE  50 mg Oral TID   insulin aspart  0-15 Units Subcutaneous TID WC   insulin aspart  0-5 Units Subcutaneous QHS   leptospermum manuka honey  1 Application Topical Daily   lidocaine HCl (PF)       lisinopril  40 mg Oral Daily   metoprolol tartrate  200 mg Oral BID   Continuous Infusions:  cefTRIAXone (ROCEPHIN)  IV 2 g (12/25/21 2225)     LOS: 2 days    Time spent: 37 min  Georgette Shell, MD 12/26/2021, 1:54 PM

## 2021-12-27 DIAGNOSIS — L03115 Cellulitis of right lower limb: Secondary | ICD-10-CM | POA: Diagnosis not present

## 2021-12-27 LAB — COMPREHENSIVE METABOLIC PANEL
ALT: 91 U/L — ABNORMAL HIGH (ref 0–44)
AST: 54 U/L — ABNORMAL HIGH (ref 15–41)
Albumin: 2.9 g/dL — ABNORMAL LOW (ref 3.5–5.0)
Alkaline Phosphatase: 112 U/L (ref 38–126)
Anion gap: 10 (ref 5–15)
BUN: 33 mg/dL — ABNORMAL HIGH (ref 6–20)
CO2: 28 mmol/L (ref 22–32)
Calcium: 8.9 mg/dL (ref 8.9–10.3)
Chloride: 97 mmol/L — ABNORMAL LOW (ref 98–111)
Creatinine, Ser: 1.5 mg/dL — ABNORMAL HIGH (ref 0.61–1.24)
GFR, Estimated: 53 mL/min — ABNORMAL LOW (ref >60)
Glucose, Bld: 143 mg/dL — ABNORMAL HIGH (ref 70–99)
Potassium: 4.6 mmol/L (ref 3.5–5.1)
Sodium: 135 mmol/L (ref 135–145)
Total Bilirubin: 1.2 mg/dL (ref 0.3–1.2)
Total Protein: 7.8 g/dL (ref 6.5–8.1)

## 2021-12-27 LAB — CBC
HCT: 39.8 % (ref 39.0–52.0)
Hemoglobin: 13.1 g/dL (ref 13.0–17.0)
MCH: 32.3 pg (ref 26.0–34.0)
MCHC: 32.9 g/dL (ref 30.0–36.0)
MCV: 98.3 fL (ref 80.0–100.0)
Platelets: 451 10*3/uL — ABNORMAL HIGH (ref 150–400)
RBC: 4.05 MIL/uL — ABNORMAL LOW (ref 4.22–5.81)
RDW: 13.1 % (ref 11.5–15.5)
WBC: 13.3 10*3/uL — ABNORMAL HIGH (ref 4.0–10.5)
nRBC: 0 % (ref 0.0–0.2)

## 2021-12-27 LAB — GLUCOSE, CAPILLARY
Glucose-Capillary: 138 mg/dL — ABNORMAL HIGH (ref 70–99)
Glucose-Capillary: 217 mg/dL — ABNORMAL HIGH (ref 70–99)
Glucose-Capillary: 206 mg/dL — ABNORMAL HIGH (ref 70–99)
Glucose-Capillary: 175 mg/dL — ABNORMAL HIGH (ref 70–99)

## 2021-12-27 MED ORDER — COLCHICINE 0.6 MG PO TABS
0.6000 mg | ORAL_TABLET | Freq: Three times a day (TID) | ORAL | Status: DC
  Administered 2021-12-27: 0.6 mg via ORAL
  Filled 2021-12-27: qty 1

## 2021-12-27 MED ORDER — METOPROLOL TARTRATE 25 MG PO TABS
25.0000 mg | ORAL_TABLET | Freq: Two times a day (BID) | ORAL | Status: DC
Start: 2021-12-27 — End: 2021-12-28
  Administered 2021-12-27 – 2021-12-28 (×2): 25 mg via ORAL
  Filled 2021-12-27 (×2): qty 1

## 2021-12-27 MED ORDER — DEXAMETHASONE SODIUM PHOSPHATE 4 MG/ML IJ SOLN
4.0000 mg | Freq: Once | INTRAMUSCULAR | Status: AC
Start: 2021-12-27 — End: 2021-12-27
  Administered 2021-12-27: 4 mg via INTRAVENOUS
  Filled 2021-12-27: qty 1

## 2021-12-27 MED ORDER — SODIUM CHLORIDE 0.9 % IV SOLN: INTRAVENOUS | Status: DC

## 2021-12-27 MED ORDER — COLCHICINE 0.6 MG PO TABS
0.6000 mg | ORAL_TABLET | Freq: Two times a day (BID) | ORAL | Status: DC
  Administered 2021-12-27 – 2021-12-28 (×3): 0.6 mg via ORAL
  Filled 2021-12-27 (×3): qty 1

## 2021-12-27 MED ORDER — COLCHICINE 0.3 MG HALF TABLET
0.3000 mg | ORAL_TABLET | Freq: Every day | ORAL | Status: DC
Start: 2021-12-27 — End: 2021-12-27

## 2021-12-27 NOTE — Progress Notes (Signed)
Wound care given to pt per order. Tolerated well. Has not had any pain or discomfort during this writers shift. Remains NPO since midnight per order. Pt family member has been at bedside.Will continue to monitor.

## 2021-12-27 NOTE — Progress Notes (Signed)
Patient ID: Anthony Logan, male   DOB: [birth year], 61 y.o.   MRN: [id number]  Right ankle foot cellulitis  Rt ankle foot gout Diabetic foot ulcers   Based on lab results (see below) I will not rec surgery at this time.   However I will adjust his colchicine and and iv steroids (glucose is being monitored)  Synovial cell count + diff, w/ crystals Order: [id number] Status: Final result   Visible to patient: Yes (seen)    Next appt: 01/28/2022 at 08:30 AM in Endocrinology Glade Lloyd, MD)         Component Ref Range & Units 1 d ago  Color, Synovial YELLOW YELLOW   Appearance-Synovial CLEAR CLOUDY Abnormal    Crystals, Fluid  INTRACELLULAR MONOSODIUM URATE CRYSTALS   WBC, Synovial 0 - 200 /cu mm 55,000 High    Neutrophil, Synovial 0 - 25 % 91 High    Lymphocytes-Synovial Fld 0 - 20 % 2   Monocyte-Macrophage-Synovial Fluid 50 - 90 % 7 Low    Eosinophils-Synovial 0 - 1 % 0   Comment: Performed at [facility], [address] [place], [place] [address]  Resulting Agency  [facility]         Specimen Collected: 12/26/21 13:30 Last Resulted: 12/26/21 17:52        Body fluid culture w Gram Stain Order: [id number] Status: Preliminary result    Visible to patient: No (not released)    Next appt: 01/28/2022 at 08:30 AM in Endocrinology Glade Lloyd, MD)    Specimen Information: Synovium; Body Fluid  0 Result Notes    Component 1 d ago  Specimen Description SYNOVIAL  Performed at [facility], [address], [place], [place] [address]   Special Requests NONE  Performed at [facility], [address] [place], [place] [address]   Gram Stain ABUNDANT WBC PRESENT, PREDOMINANTLY PMN  NO ORGANISMS SEEN  Performed at [facility], [place] [address], [place], [place] [address]   Culture PENDING   Report Status PENDING

## 2021-12-27 NOTE — Progress Notes (Signed)
PROGRESS NOTE    Anthony Logan  SWH:[id number] DOB: [birth year] DOA: 12/24/2021 PCP: [name], DO   Brief Narrative: 61 year old male with medical history significant for type 2 diabetes, hypertension, gout, CKD admitted with right lower extremity right foot swelling and redness started a week and a half ago.  He gives a history of trauma to the right foot while he was trying to cut wood and the wood fell on his right foot.  Was started on colchicine thinking this is a gout flare but he continued with increased swelling and redness.  He was given Rocephin shot in the urgent care on 12/22/2021.  Urgent care called him and asked her to come to the hospital due to leukocytosis 15.9.  Patient also had fever at home 101.6.  The time of admission sed rate was 117 white count 12.8 lactic acid 2.2.  X-ray of the right foot showed no fracture or dislocation. Doppler shows no evidence of DVT.  Assessment & Plan:   Principal Problem:   Cellulitis of right leg Active Problems:   Diabetic ulcer of left great toe (HCC)   Hyperglycemia   Hypertension associated with diabetes ([place])   T2DM (type 2 diabetes mellitus) ([place])   Gout   Chronic kidney disease   Sleep apnea   Depression   #1 right lower extremity cellulitis secondary to trauma in the setting of type 2 diabetes and gout and chronic nonhealing wound to the left big toe -still with leukocytosis, elevated serum reactive protein and sed rate.   On Rocephin 2 g IV daily. status post arthrocentesis 12/26/2021.  Findings consistent with monosodium urate crystals and inflammation. Colchicine and steroids today MRI of the right foot -polyarticular arthropathy involving the tibiotalar and posterior subtalar joints with large joint effusion and synovitis that could reflect inflammatory and crystalline arthropathy or septic arthritis.  Moderate flexor and mild peroneal tenosynovitis.  Mild to moderate posterior tibial and Achilles tendinosis. His  right foot is significantly swollen compared to his left foot. He was given a dose of tetanus booster Will continue allopurinol and colchicine  #2 chronic nonhealing ulcer to the left toe followed by wound care as an outpatient Appreciate wound care team input during this admission. Left foot x-ray shows no evidence of osteomyelitis.  #3 type 2 diabetes  Hemoglobin A1c 5.4  On ssi  #4 depression on bupropion  #5 CKD stage III AA at baseline  #6 history of gout on allopurinol  #7 history of essential hypertension on multiple medications prior to admission including hydralazine, HCTZ, ACE inhibitor and metoprolol.  #8 hyperlipidemia-on statin  Pressure Injury 12/24/21 Toe (Comment  which one) Right;Posterior Unstageable - Full thickness tissue loss in which the base of the injury is covered by slough (yellow, tan, gray, green or brown) and/or eschar (tan, brown or black) in the wound bed. Smal (Active)  12/24/21 1855  Location: Toe (Comment  which one)  Location Orientation: Right;Posterior  Staging: Unstageable - Full thickness tissue loss in which the base of the injury is covered by slough (yellow, tan, gray, green or brown) and/or eschar (tan, brown or black) in the wound bed.  Wound Description (Comments): Small open wound with black wound bed  Present on Admission: Yes  Dressing Type Gauze (Comment);Honey 12/26/21 1827    Estimated body mass index is 34.86 kg/m as calculated from the following:   Height as of this encounter: '6\' 5"'$  (1.956 m).   Weight as of this encounter: 133.4 kg.  DVT  prophylaxis: lovenox Code Status:full Family Communication: dw wife Disposition Plan:  Status is: Inpatient Remains inpatient appropriate because: iv antibiotics   Consultants: none  Procedures: none Antimicrobials:  rocephin Subjective:   Continues to have swelling to the right ankle continues to have pain with movement and decreased range of motion and unable to  ambulate Objective: Vitals:   12/26/21 1228 12/26/21 2111 12/27/21 0517 12/27/21 0526  BP: 126/77 109/73 (!) 144/112 116/64  Pulse: 79 94 84   Resp: '19 16 16   '$ Temp: 97.9 F (36.6 C) 98.6 F (37 C) 98.4 F (36.9 C)   TempSrc:      SpO2: 100% 95% 98%   Weight:      Height:        Intake/Output Summary (Last 24 hours) at 12/27/2021 1306 Last data filed at 12/27/2021 0700 Gross per 24 hour  Intake 360 ml  Output 2 ml  Net 358 ml    Filed Weights   12/24/21 1434  Weight: 133.4 kg    Examination:  General exam: Appears un comfortable Respiratory system: Clear to auscultation. Respiratory effort normal. Cardiovascular system: S1 & S2 heard, RRR. No JVD, murmurs, rubs, gallops or clicks. No pedal edema. Gastrointestinal system: Abdomen is nondistended, soft and nontender. No organomegaly or masses felt. Normal bowel sounds heard. Central nervous system: Alert and oriented. No focal neurological deficits. Extremities: right foot swollen tender erythematous Skin: No rashes, lesions or ulcers Psychiatry: Judgement and insight appear normal. Mood & affect appropriate.     Data Reviewed: I have personally reviewed following labs and imaging studies  CBC: Recent Labs  Lab 12/22/21 1206 12/24/21 1438 12/25/21 0449 12/27/21 0445  WBC 15.9* 12.8* 12.2* 13.3*  NEUTROABS 13.3* 10.9*  --   --   HGB 12.8* 13.1 11.7* 13.1  HCT 37.0* 39.5 35.0* 39.8  MCV 96 99.5 98.6 98.3  PLT 272 295 346 451*    Basic Metabolic Panel: Recent Labs  Lab 12/24/21 1626 12/25/21 0449 12/27/21 0445  NA 134* 137 135  K 4.7 3.6 4.6  CL 97* 102 97*  CO2 '28 27 28  '$ GLUCOSE 329* 109* 143*  BUN 36* 32* 33*  CREATININE 1.44* 1.21 1.50*  CALCIUM 8.5* 8.2* 8.9    GFR: Estimated Creatinine Clearance: 79.1 mL/min (A) (by C-G formula based on SCr of 1.5 mg/dL (H)). Liver Function Tests: Recent Labs  Lab 12/24/21 1626 12/27/21 0445  AST 81* 54*  ALT 81* 91*  ALKPHOS 106 112  BILITOT 0.8 1.2   PROT 7.4 7.8  ALBUMIN 2.9* 2.9*    No results for input(s): "LIPASE", "AMYLASE" in the last 168 hours. No results for input(s): "AMMONIA" in the last 168 hours. Coagulation Profile: No results for input(s): "INR", "PROTIME" in the last 168 hours. Cardiac Enzymes: No results for input(s): "CKTOTAL", "CKMB", "CKMBINDEX", "TROPONINI" in the last 168 hours. BNP (last 3 results) No results for input(s): "PROBNP" in the last 8760 hours. HbA1C: Recent Labs    12/24/21 1438  HGBA1C 5.4    CBG: Recent Labs  Lab 12/26/21 1107 12/26/21 1726 12/26/21 2104 12/27/21 0724 12/27/21 1120  GLUCAP 179* 130* 163* 138* 217*    Lipid Profile: No results for input(s): "CHOL", "HDL", "LDLCALC", "TRIG", "CHOLHDL", "LDLDIRECT" in the last 72 hours. Thyroid Function Tests: No results for input(s): "TSH", "T4TOTAL", "FREET4", "T3FREE", "THYROIDAB" in the last 72 hours. Anemia Panel: No results for input(s): "VITAMINB12", "FOLATE", "FERRITIN", "TIBC", "IRON", "RETICCTPCT" in the last 72 hours. Sepsis Labs: Recent Labs  Lab  12/24/21 1439 12/24/21 1626  LATICACIDVEN 2.2* 1.8     Recent Results (from the past 240 hour(s))  Novel Coronavirus, NAA ([facility])     Status: None   Collection Time: 12/19/21  9:47 AM   Specimen: Nasopharyngeal(NP) swabs in vial transport medium  Result Value Ref Range Status   SARS-CoV-2, NAA Not Detected Not Detected Final    Comment: This nucleic acid amplification test was developed and its performance characteristics determined by [employer]. Nucleic acid amplification tests include RT-PCR and TMA. This test has not been FDA cleared or approved. This test has been authorized by FDA under an Emergency Use Authorization (EUA). This test is only authorized for the duration of time the declaration that circumstances exist justifying the authorization of the emergency use of in vitro diagnostic tests for detection of SARS-CoV-2 virus and/or diagnosis of  COVID-19 infection under section 564(b)(1) of the Act, 21 U.S.C. 287OMV-6(H) (1), unless the authorization is terminated or revoked sooner. When diagnostic testing is negative, the possibility of a false negative result should be considered in the context of a patient's recent exposures and the presence of clinical signs and symptoms consistent with COVID-19. An individual without symptoms of COVID-19 and who is not shedding SARS-CoV-2 virus wo uld expect to have a negative (not detected) result in this assay.   Blood culture (routine x 2)     Status: None (Preliminary result)   Collection Time: 12/24/21  4:27 PM   Specimen: BLOOD  Result Value Ref Range Status   Specimen Description BLOOD BLOOD RIGHT ARM  Final   Special Requests   Final    BOTTLES DRAWN AEROBIC AND ANAEROBIC Blood Culture results may not be optimal due to an excessive volume of blood received in culture bottles   Culture   Final    NO GROWTH 3 DAYS Performed at [facility], [address], [place], [place] [address]    Report Status PENDING  Incomplete  Blood culture (routine x 2)     Status: None (Preliminary result)   Collection Time: 12/24/21  4:27 PM   Specimen: BLOOD  Result Value Ref Range Status   Specimen Description BLOOD BLOOD RIGHT HAND  Final   Special Requests   Final    BOTTLES DRAWN AEROBIC AND ANAEROBIC Blood Culture results may not be optimal due to an excessive volume of blood received in culture bottles   Culture   Final    NO GROWTH 3 DAYS Performed at [facility], [address] [place], [place] [address]    Report Status PENDING  Incomplete  MRSA Next Gen by PCR, Nasal     Status: None   Collection Time: 12/25/21 10:20 AM   Specimen: Nasal Mucosa; Nasal Swab  Result Value Ref Range Status   MRSA by PCR Next Gen NOT DETECTED NOT DETECTED Final    Comment: (NOTE) The GeneXpert MRSA Assay (FDA approved for NASAL specimens only), is one component of a comprehensive MRSA colonization  surveillance program. It is not intended to diagnose MRSA infection nor to guide or monitor treatment for MRSA infections. Test performance is not FDA approved in patients less than 39 years old. Performed at [facility], [address] [place], [place] [address]   Body fluid culture w Gram Stain     Status: None (Preliminary result)   Collection Time: 12/26/21  1:30 PM   Specimen: Synovium; Body Fluid  Result Value Ref Range Status   Specimen Description   Final  SYNOVIAL Performed at [facility], [address] [place], [place] [address]    Special Requests   Final    NONE Performed at [facility], [address], [place], [place] [address]    Gram Stain   Final    ABUNDANT WBC PRESENT, PREDOMINANTLY PMN NO ORGANISMS SEEN    Culture   Final    NO GROWTH < 24 HOURS Performed at [facility] [address] [place], [place] [address]    Report Status PENDING  Incomplete         Radiology Studies: [place] RT LOWER EXTREM LTD SOFT TISSUE NON VASCULAR  Result Date: 12/26/2021 INDICATION: Right ankle effusion. EXAM: ULTRASOUND-GUIDED RIGHT ANKLE JOINT ASPIRATION MEDICATIONS: None. ANESTHESIA/SEDATION: None. COMPLICATIONS: None immediate. PROCEDURE: Informed written consent was obtained from the patient after a thorough discussion of the procedural risks, benefits and alternatives. All questions were addressed. Maximal Sterile Barrier Technique was utilized including caps, mask, sterile gowns, sterile gloves, sterile drape, hand hygiene and skin antiseptic. A timeout was performed prior to the initiation of the procedure. Focused right ankle ultrasound demonstrated a large tibiotalar joint effusion. The anteromedial aspect of the ankle was marked for aspiration. The ankle was prepped and draped in the usual sterile manner. The overlying skin was anesthetized with 1 ml of 2% lidocaine. The tibiotalar joint space was accessed under ultrasound guidance using an 18 gauge needle.  Approximately 5 ml of cloudy synovial fluid was removed. The needle was removed and a bandage applied. The fluid was sent for laboratory analysis. The procedure was well tolerated and without complication. Hemostasis was achieved. The patient was returned to the inpatient unit in stable condition. IMPRESSION: Technically successful right ankle joint aspiration under ultrasound guidance. Electronically Signed   By: Titus Dubin M.D.   On: 12/26/2021 14:32   US Guided Needle Placement  Result Date: 12/26/2021 INDICATION: Right ankle effusion. EXAM: ULTRASOUND-GUIDED RIGHT ANKLE JOINT ASPIRATION MEDICATIONS: None. ANESTHESIA/SEDATION: None. COMPLICATIONS: None immediate. PROCEDURE: Informed written consent was obtained from the patient after a thorough discussion of the procedural risks, benefits and alternatives. All questions were addressed. Maximal Sterile Barrier Technique was utilized including caps, mask, sterile gowns, sterile gloves, sterile drape, hand hygiene and skin antiseptic. A timeout was performed prior to the initiation of the procedure. Focused right ankle ultrasound demonstrated a large tibiotalar joint effusion. The anteromedial aspect of the ankle was marked for aspiration. The ankle was prepped and draped in the usual sterile manner. The overlying skin was anesthetized with 1 ml of 2% lidocaine. The tibiotalar joint space was accessed under ultrasound guidance using an 18 gauge needle. Approximately 5 ml of cloudy synovial fluid was removed. The needle was removed and a bandage applied. The fluid was sent for laboratory analysis. The procedure was well tolerated and without complication. Hemostasis was achieved. The patient was returned to the inpatient unit in stable condition. IMPRESSION: Technically successful right ankle joint aspiration under ultrasound guidance. Electronically Signed   By: Titus Dubin M.D.   On: 12/26/2021 14:32   MR HEEL RIGHT WO CONTRAST  Result Date:  12/25/2021 CLINICAL DATA:  Right lower leg infection. Medial hindfoot erythema and swelling. EXAM: MR OF THE RIGHT HEEL WITHOUT CONTRAST TECHNIQUE: Multiplanar, multisequence MR imaging of the right ankle was performed. No intravenous contrast was administered. COMPARISON:  Right foot and ankle x-rays from yesterday. FINDINGS: TENDONS Peroneal: Peroneal longus tendon intact. Peroneal brevis intact. Small amount of fluid in the peroneal tendon sheaths. Posteromedial: Posterior tibial tendon  intact with fusiform enlargement and tendinosis. Small amount of fluid in the tendon sheath. Flexor digitorum longus tendon intact with moderate amount of fluid in the tendon sheath. Flexor hallucis longus tendon intact with moderate amount of fluid in the tendon sheath. Anterior: Tibialis anterior tendon intact. Extensor hallucis longus tendon intact. Extensor digitorum longus tendon intact. Achilles: Intact. Mild-to-moderate tendon thickening with increased intermediate signal. Plantar Fascia: Intact. LIGAMENTS Lateral: Anterior talofibular ligament intact. Calcaneofibular ligament intact. Posterior talofibular ligament intact. Anterior and posterior tibiofibular ligaments intact. Medial: Deltoid ligament intact. Spring ligament intact. CARTILAGE Ankle Joint: Large joint effusion with synovitis. Small full-thickness cartilage defect over the posterior tibial plafond. Subtalar Joints/Sinus Tarsi: Large posterior subtalar joint effusion with synovitis. Near complete loss of the normal fat within the sinus tarsi with prominent marrow edema in the overlying lateral talar process and adjacent calcaneus. Bones: Periarticular marrow edema about the tibiotalar and subtalar joints. Small calcaneocuboid and talonavicular joint effusions. No acute fracture or dislocation. No suspicious bone lesion. Mild midfoot osteoarthritis. Soft Tissue: Diffuse soft tissue swelling. No soft tissue mass or fluid collection. IMPRESSION: 1. Polyarticular  arthropathy involving the tibiotalar and posterior subtalar joints with large joint effusions and synovitis that could reflect inflammatory/crystal arthropathy or septic arthritis. Correlation with arthrocentesis is recommended. 2. Moderate flexor and mild peroneal tenosynovitis. 3. Mild to moderate posterior tibial and Achilles tendinosis. Electronically Signed   By: Titus Dubin M.D.   On: 12/25/2021 16:23        Scheduled Meds:  allopurinol  300 mg Oral Daily   aspirin  81 mg Oral Daily   atorvastatin  20 mg Oral Daily   buPROPion  300 mg Oral Daily   colchicine  0.6 mg Oral Q8H   enoxaparin (LOVENOX) injection  40 mg Subcutaneous Q24H   gabapentin  600 mg Oral TID   hydrALAZINE  50 mg Oral TID   insulin aspart  0-15 Units Subcutaneous TID WC   insulin aspart  0-5 Units Subcutaneous QHS   leptospermum manuka honey  1 Application Topical Daily   lisinopril  40 mg Oral Daily   metoprolol tartrate  200 mg Oral BID   Continuous Infusions:  sodium chloride 75 mL/hr at 12/27/21 0838   cefTRIAXone (ROCEPHIN)  IV 2 g (12/26/21 2116)     LOS: 3 days    Time spent: 35 minutes Georgette Shell, MD 12/27/2021, 1:06 PM

## 2021-12-27 NOTE — Progress Notes (Signed)
Pt is alert and oriented x4. Pt has denied any pain this shift. Wounds to bilateral great toes cleaned and redressed as ordered.

## 2021-12-28 DIAGNOSIS — L03115 Cellulitis of right lower limb: Secondary | ICD-10-CM | POA: Diagnosis not present

## 2021-12-28 LAB — COMPREHENSIVE METABOLIC PANEL
ALT: 127 U/L — ABNORMAL HIGH (ref 0–44)
AST: 93 U/L — ABNORMAL HIGH (ref 15–41)
Albumin: 2.8 g/dL — ABNORMAL LOW (ref 3.5–5.0)
Alkaline Phosphatase: 130 U/L — ABNORMAL HIGH (ref 38–126)
Anion gap: 10 (ref 5–15)
BUN: 34 mg/dL — ABNORMAL HIGH (ref 6–20)
CO2: 26 mmol/L (ref 22–32)
Calcium: 8.6 mg/dL — ABNORMAL LOW (ref 8.9–10.3)
Chloride: 98 mmol/L (ref 98–111)
Creatinine, Ser: 1.22 mg/dL (ref 0.61–1.24)
GFR, Estimated: >60 mL/min (ref >60)
Glucose, Bld: 173 mg/dL — ABNORMAL HIGH (ref 70–99)
Potassium: 4 mmol/L (ref 3.5–5.1)
Sodium: 134 mmol/L — ABNORMAL LOW (ref 135–145)
Total Bilirubin: 0.8 mg/dL (ref 0.3–1.2)
Total Protein: 7.8 g/dL (ref 6.5–8.1)

## 2021-12-28 LAB — GLUCOSE, CAPILLARY
Glucose-Capillary: 166 mg/dL — ABNORMAL HIGH (ref 70–99)
Glucose-Capillary: 216 mg/dL — ABNORMAL HIGH (ref 70–99)

## 2021-12-28 LAB — CBC
HCT: 39.5 % (ref 39.0–52.0)
Hemoglobin: 13.2 g/dL (ref 13.0–17.0)
MCH: 32.7 pg (ref 26.0–34.0)
MCHC: 33.4 g/dL (ref 30.0–36.0)
MCV: 97.8 fL (ref 80.0–100.0)
Platelets: 487 10*3/uL — ABNORMAL HIGH (ref 150–400)
RBC: 4.04 MIL/uL — ABNORMAL LOW (ref 4.22–5.81)
RDW: 13 % (ref 11.5–15.5)
WBC: 18.2 10*3/uL — ABNORMAL HIGH (ref 4.0–10.5)
nRBC: 0 % (ref 0.0–0.2)

## 2021-12-28 MED ORDER — ACETAMINOPHEN 325 MG PO TABS: 650.0000 mg | ORAL_TABLET | Freq: Four times a day (QID) | ORAL | Status: AC | PRN

## 2021-12-28 MED ORDER — COLCHICINE 0.6 MG PO TABS: 0.6000 mg | ORAL_TABLET | Freq: Two times a day (BID) | ORAL | 1 refills | Status: DC

## 2021-12-28 MED ORDER — AMOXICILLIN-POT CLAVULANATE 500-125 MG PO TABS: 1.0000 | ORAL_TABLET | Freq: Three times a day (TID) | ORAL | 0 refills | Status: DC

## 2021-12-28 MED ORDER — PREDNISONE 10 MG PO TABS
ORAL_TABLET | ORAL | 0 refills | Status: DC
Start: 2021-12-28 — End: 2022-01-08

## 2021-12-28 MED ORDER — METOPROLOL TARTRATE 25 MG PO TABS: 25.0000 mg | ORAL_TABLET | Freq: Two times a day (BID) | ORAL | 2 refills | Status: DC

## 2021-12-28 MED ORDER — LISINOPRIL 40 MG PO TABS: 20.0000 mg | ORAL_TABLET | Freq: Every day | ORAL | 3 refills | Status: DC

## 2021-12-28 MED ORDER — SACCHAROMYCES BOULARDII 250 MG PO CAPS: 250.0000 mg | ORAL_CAPSULE | Freq: Two times a day (BID) | ORAL | 0 refills | Status: DC

## 2021-12-28 MED ORDER — MEDIHONEY WOUND/BURN DRESSING EX PSTE: 1.0000 | PASTE | Freq: Every day | CUTANEOUS | 2 refills | Status: DC

## 2021-12-28 NOTE — Discharge Summary (Signed)
Physician Discharge Summary  Anthony Logan IRJ:[id number] DOB: [birth year] DOA: 12/24/2021  PCP: [name], DO  Admit date: 12/24/2021 Discharge date: 12/28/2021  Admitted From: home Disposition:  home  Recommendations for Outpatient Follow-up:  Follow up with PCP in 1-2 weeks Please obtain BMP/CBC in one week Please follow up  with rheumatology PCP please note that I have stopped his hydrochlorothiazide due to acute gout flare, I have decreased the dose of metoprolol and lisinopril.  I have stopped the Lasix and continue the hydralazine.  This changes were made due to soft blood pressure to normal blood pressure during the hospital stay.  He only received a low-dose of metoprolol and hydralazine during his hospital stay.    Home Health:none Equipment/Devices:none  Discharge Condition:stable CODE STATUS:full Diet recommendation: cardiac Brief/Interim Summary: 61 year old male with medical history significant for type 2 diabetes, hypertension, gout, CKD admitted with right lower extremity right foot swelling and redness started a week and a half ago.  He gives a history of trauma to the right foot while he was trying to cut wood and the wood fell on his right foot.  Was started on colchicine thinking this is a gout flare but he continued with increased swelling and redness.  He was given Rocephin shot in the urgent care on 12/22/2021.  Urgent care called him and asked her to come to the hospital due to leukocytosis 15.9.  Patient also had fever at home 101.6.  The time of admission sed rate was 117 white count 12.8 lactic acid 2.2.  X-ray of the right foot showed no fracture or dislocation. Doppler shows no evidence of DVT. Discharge Diagnoses:  Principal Problem:   Cellulitis of right leg Active Problems:   Diabetic ulcer of left great toe (HCC)   Hyperglycemia   Hypertension associated with diabetes ([place])   T2DM (type 2 diabetes mellitus) ([place])   Gout   Chronic kidney disease    Sleep apnea   Depression  #1 right lower extremity cellulitis secondary to trauma in the setting of type 2 diabetes and gout and chronic nonhealing wound to the left big toe and right big toe -upon admission patient had leukocytosis elevated sed rate and C-reactive protein.  He was treated with Rocephin 2 g daily. status post arthrocentesis 12/26/2021.  Findings consistent with monosodium urate crystals and inflammation. Colchicine and steroids today MRI of the right foot -polyarticular arthropathy involving the tibiotalar and posterior subtalar joints with large joint effusion and synovitis that could reflect inflammatory and crystalline arthropathy or septic arthritis.  Moderate flexor and mild peroneal tenosynovitis.  Mild to moderate posterior tibial and Achilles tendinosis. His right foot is significantly swollen compared to his left foot. He was given a dose of tetanus booster He was treated with allopurinol, Decadron and colchicine He had arthrocentesis by interventional radiology done on 12/26/2021 findings consistent with monosodium urate crystals and inflammation with WBC count of 55 000.  No growth in the culture at the time of discharge.  Gram stain showed abundant WBCs no organisms. He will be discharged on Augmentin, prednisone, colchicine.   #2 chronic nonhealing ulcer to the left toe followed by wound care as an outpatient seen by wound care during this hospital stay.  Continue outpatient wound care as prior to admission.  And follow-up with the clinic. Left foot x-ray shows no evidence of osteomyelitis.   #3 type 2 diabetes  Hemoglobin A1c 5.4  Continue Ozempic   #4 depression on bupropion   #5 CKD  stage III A at baseline   #6  Acute gout of the right ankle-continue colchicine and allopurinol and prednisone DC HCTZ   #7 history of essential hypertension on multiple medications -his blood pressure was soft and normal during the hospital stay.  He received only metoprolol and  hydralazine during the hospital stay.  Upon discharge I have stopped Lasix hydrochlorothiazide.  I have decreased the dose of lisinopril and metoprolol.  And continued hydralazine.     #8 hyperlipidemia-on statin  Pressure Injury 12/24/21 Toe (Comment  which one) Right;Posterior Unstageable - Full thickness tissue loss in which the base of the injury is covered by slough (yellow, tan, gray, green or brown) and/or eschar (tan, brown or black) in the wound bed. Smal (Active)  12/24/21 1855  Location: Toe (Comment  which one)  Location Orientation: Right;Posterior  Staging: Unstageable - Full thickness tissue loss in which the base of the injury is covered by slough (yellow, tan, gray, green or brown) and/or eschar (tan, brown or black) in the wound bed.  Wound Description (Comments): Small open wound with black wound bed  Present on Admission: Yes  Dressing Type Gauze (Comment) 12/28/21 0541    Estimated body mass index is 34.86 kg/m as calculated from the following:   Height as of this encounter: '6\' 5"'$  (1.956 m).   Weight as of this encounter: 133.4 kg.  Discharge Instructions  Discharge Instructions     Diet - low sodium heart healthy   Complete by: As directed    Discharge wound care:   Complete by: As directed    Wound care to bilateral toe wounds.  Cleanse with soap and water rinse and pat dry.  Apply Medihoney and a 3 mm layer to wounds top with saline dampened gauze, top with dry gauze and secure with bandaging or paper tape and change daily.  Float heels off the bed surface.   Increase activity slowly   Complete by: As directed       Allergies as of 12/28/2021       Reactions   Diltiazem Swelling   Wt gain,swelling hands,feet,gum bleeding   Clonidine Derivatives Swelling   Procardia [nifedipine] Swelling   Swelling on feet and legs   Betadine [povidone-iodine] Rash        Medication List     STOP taking these medications    furosemide 20 MG tablet Commonly  known as: LASIX   hydrochlorothiazide 12.5 MG capsule Commonly known as: MICROZIDE       TAKE these medications    Accu-Chek FastClix Lancets Misc USE TO TEST GLUCOSE ONCE A DAY AS DIRECTED   acetaminophen 325 MG tablet Commonly known as: TYLENOL Take 2 tablets (650 mg total) by mouth every 6 (six) hours as needed for mild pain (or Fever >/= 101).   allopurinol 300 MG tablet Commonly known as: ZYLOPRIM Take 1 tablet (300 mg total) by mouth daily.   Alpha-Lipoic Acid 600 MG Caps Take 1 capsule (600 mg total) by mouth daily. For diabetic neuropathy   amoxicillin-clavulanate 500-125 MG tablet Commonly known as: Augmentin Take 1 tablet (500 mg total) by mouth 3 (three) times daily.   aspirin 81 MG chewable tablet Chew 81 mg by mouth daily.   atorvastatin 20 MG tablet Commonly known as: LIPITOR Take 1 tablet (20 mg total) by mouth daily.   buPROPion 300 MG 24 hr tablet Commonly known as: WELLBUTRIN XL Take 1 tablet (300 mg total) by mouth daily.   cholecalciferol 1000 units tablet  Commonly known as: VITAMIN D Take 1,000 Units by mouth daily.   colchicine 0.6 MG tablet Take 1 tablet (0.6 mg total) by mouth 2 (two) times daily.   cyclobenzaprine 10 MG tablet Commonly known as: FLEXERIL TAKE 1 TABLET AT BEDTIME   gabapentin 600 MG tablet Commonly known as: NEURONTIN Take 1 tablet (600 mg total) by mouth 3 (three) times daily.   hydrALAZINE 50 MG tablet Commonly known as: APRESOLINE TAKE 1 TABLET 3 TIMES A DAY(DOSE INCREASE) What changed: See the new instructions.   leptospermum manuka honey Pste paste Apply 1 Application topically daily. Start taking on: December 29, 2021   lisinopril 40 MG tablet Commonly known as: ZESTRIL Take 0.5 tablets (20 mg total) by mouth daily. What changed: how much to take   metoprolol tartrate 25 MG tablet Commonly known as: LOPRESSOR Take 1 tablet (25 mg total) by mouth 2 (two) times daily. What changed:  medication  strength how much to take   MULTIVITAMIN ADULT PO Take 1 tablet by mouth daily.   predniSONE 10 MG tablet Commonly known as: DELTASONE Take 2 tablets daily for 5 days What changed:  medication strength how much to take how to take this when to take this additional instructions   saccharomyces boulardii 250 MG capsule Commonly known as: Florastor Take 1 capsule (250 mg total) by mouth 2 (two) times daily.   Semaglutide (2 MG/DOSE) 8 MG/3ML Sopn Inject 2 mg as directed once a week.   sildenafil 100 MG tablet Commonly known as: VIAGRA Take 1 tablet (100 mg total) by mouth daily as needed for erectile dysfunction.               Discharge Care Instructions  (From admission, onward)           Start     Ordered   12/28/21 0000  Discharge wound care:       Comments: Wound care to bilateral toe wounds.  Cleanse with soap and water rinse and pat dry.  Apply Medihoney and a 3 mm layer to wounds top with saline dampened gauze, top with dry gauze and secure with bandaging or paper tape and change daily.  Float heels off the bed surface.   12/28/21 1211            Follow-up Information     [facility] [place] [facility].   Contact information: [address] [place] [address] [phone]                Allergies  Allergen Reactions   Diltiazem Swelling    Wt gain,swelling hands,feet,gum bleeding   Clonidine Derivatives Swelling   Procardia [Nifedipine] Swelling    Swelling on feet and legs    Betadine [Povidone-Iodine] Rash    Consultations: Ortho ir   Procedures/Studies: [place] RT LOWER EXTREM LTD SOFT TISSUE NON VASCULAR  Result Date: 12/26/2021 INDICATION: Right ankle effusion. EXAM: ULTRASOUND-GUIDED RIGHT ANKLE JOINT ASPIRATION MEDICATIONS: None. ANESTHESIA/SEDATION: None. COMPLICATIONS: None immediate. PROCEDURE: Informed written consent was obtained from the patient after a thorough discussion of the procedural risks,  benefits and alternatives. All questions were addressed. Maximal Sterile Barrier Technique was utilized including caps, mask, sterile gowns, sterile gloves, sterile drape, hand hygiene and skin antiseptic. A timeout was performed prior to the initiation of the procedure. Focused right ankle ultrasound demonstrated a large tibiotalar joint effusion. The anteromedial aspect of the ankle was marked for aspiration. The ankle was prepped and draped in the usual sterile manner. The  overlying skin was anesthetized with 1 ml of 2% lidocaine. The tibiotalar joint space was accessed under ultrasound guidance using an 18 gauge needle. Approximately 5 ml of cloudy synovial fluid was removed. The needle was removed and a bandage applied. The fluid was sent for laboratory analysis. The procedure was well tolerated and without complication. Hemostasis was achieved. The patient was returned to the inpatient unit in stable condition. IMPRESSION: Technically successful right ankle joint aspiration under ultrasound guidance. Electronically Signed   By: Titus Dubin M.D.   On: 12/26/2021 14:32   US Guided Needle Placement  Result Date: 12/26/2021 INDICATION: Right ankle effusion. EXAM: ULTRASOUND-GUIDED RIGHT ANKLE JOINT ASPIRATION MEDICATIONS: None. ANESTHESIA/SEDATION: None. COMPLICATIONS: None immediate. PROCEDURE: Informed written consent was obtained from the patient after a thorough discussion of the procedural risks, benefits and alternatives. All questions were addressed. Maximal Sterile Barrier Technique was utilized including caps, mask, sterile gowns, sterile gloves, sterile drape, hand hygiene and skin antiseptic. A timeout was performed prior to the initiation of the procedure. Focused right ankle ultrasound demonstrated a large tibiotalar joint effusion. The anteromedial aspect of the ankle was marked for aspiration. The ankle was prepped and draped in the usual sterile manner. The overlying skin was anesthetized  with 1 ml of 2% lidocaine. The tibiotalar joint space was accessed under ultrasound guidance using an 18 gauge needle. Approximately 5 ml of cloudy synovial fluid was removed. The needle was removed and a bandage applied. The fluid was sent for laboratory analysis. The procedure was well tolerated and without complication. Hemostasis was achieved. The patient was returned to the inpatient unit in stable condition. IMPRESSION: Technically successful right ankle joint aspiration under ultrasound guidance. Electronically Signed   By: Titus Dubin M.D.   On: 12/26/2021 14:32   MR HEEL RIGHT WO CONTRAST  Result Date: 12/25/2021 CLINICAL DATA:  Right lower leg infection. Medial hindfoot erythema and swelling. EXAM: MR OF THE RIGHT HEEL WITHOUT CONTRAST TECHNIQUE: Multiplanar, multisequence MR imaging of the right ankle was performed. No intravenous contrast was administered. COMPARISON:  Right foot and ankle x-rays from yesterday. FINDINGS: TENDONS Peroneal: Peroneal longus tendon intact. Peroneal brevis intact. Small amount of fluid in the peroneal tendon sheaths. Posteromedial: Posterior tibial tendon intact with fusiform enlargement and tendinosis. Small amount of fluid in the tendon sheath. Flexor digitorum longus tendon intact with moderate amount of fluid in the tendon sheath. Flexor hallucis longus tendon intact with moderate amount of fluid in the tendon sheath. Anterior: Tibialis anterior tendon intact. Extensor hallucis longus tendon intact. Extensor digitorum longus tendon intact. Achilles: Intact. Mild-to-moderate tendon thickening with increased intermediate signal. Plantar Fascia: Intact. LIGAMENTS Lateral: Anterior talofibular ligament intact. Calcaneofibular ligament intact. Posterior talofibular ligament intact. Anterior and posterior tibiofibular ligaments intact. Medial: Deltoid ligament intact. Spring ligament intact. CARTILAGE Ankle Joint: Large joint effusion with synovitis. Small  full-thickness cartilage defect over the posterior tibial plafond. Subtalar Joints/Sinus Tarsi: Large posterior subtalar joint effusion with synovitis. Near complete loss of the normal fat within the sinus tarsi with prominent marrow edema in the overlying lateral talar process and adjacent calcaneus. Bones: Periarticular marrow edema about the tibiotalar and subtalar joints. Small calcaneocuboid and talonavicular joint effusions. No acute fracture or dislocation. No suspicious bone lesion. Mild midfoot osteoarthritis. Soft Tissue: Diffuse soft tissue swelling. No soft tissue mass or fluid collection. IMPRESSION: 1. Polyarticular arthropathy involving the tibiotalar and posterior subtalar joints with large joint effusions and synovitis that could reflect inflammatory/crystal arthropathy or septic arthritis. Correlation with arthrocentesis is recommended.  2. Moderate flexor and mild peroneal tenosynovitis. 3. Mild to moderate posterior tibial and Achilles tendinosis. Electronically Signed   By: Titus Dubin M.D.   On: 12/25/2021 16:23   US Venous Img Lower Unilateral Right (DVT)  Result Date: 12/25/2021 CLINICAL DATA:  Acute right lower extremity swelling. EXAM: Right LOWER EXTREMITY VENOUS DOPPLER ULTRASOUND TECHNIQUE: Gray-scale sonography with compression, as well as color and duplex ultrasound, were performed to evaluate the deep venous system(s) from the level of the common femoral vein through the popliteal and proximal calf veins. COMPARISON:  Oct 04, 2019. FINDINGS: VENOUS Normal compressibility of the common femoral, superficial femoral, and popliteal veins, as well as the visualized calf veins. Visualized portions of profunda femoral vein and great saphenous vein unremarkable. No filling defects to suggest DVT on grayscale or color Doppler imaging. Doppler waveforms show normal direction of venous flow, normal respiratory plasticity and response to augmentation. Limited views of the contralateral  common femoral vein are unremarkable. OTHER None. Limitations: none IMPRESSION: Negative. Electronically Signed   By: Marijo Conception M.D.   On: 12/25/2021 10:10   DG Foot 2 Views Left  Result Date: 12/24/2021 CLINICAL DATA:  Left great toe diabetic ulcer EXAM: LEFT FOOT - 2 VIEW COMPARISON:  None Available. FINDINGS: Normal alignment. Remote healed fracture of the distal left fifth metatarsal. No acute fracture or dislocation. Osseous structures are diffusely osteopenic. No abnormal periosteal reaction or osseous erosion. Large superior calcaneal spur. Shallow ulcer noted along the plantar aspect of the left great toe. IMPRESSION: Shallow ulcer along the plantar aspect of the left great toe. No radiographic evidence of osteomyelitis. Electronically Signed   By: Fidela Salisbury M.D.   On: 12/24/2021 21:15   DG Foot Complete Right  Result Date: 12/24/2021 CLINICAL DATA:  Pain and swelling EXAM: RIGHT FOOT COMPLETE - 3+ VIEW COMPARISON:  None Available. FINDINGS: No fracture or dislocation is seen. No focal lytic lesions are seen. Plantar spur is seen in calcaneus. There is linear smooth marginated calcification along the plantar aspect of anterior calcaneus, possibly ligament calcification from previous injury. Bony spurs are noted in the dorsal aspect of intertarsal and tarsometatarsal joints. Small bony spurs are seen in first metatarsophalangeal joint. Small smoothly marginated calcification adjacent to the medial aspect of first metatarsophalangeal joint may be residual from previous injury. There are no opaque foreign bodies. The soft tissue swelling along the dorsal aspect. IMPRESSION: No recent fracture or dislocation is seen. There are no focal lytic lesions. Degenerative changes are noted in multiple joints. Electronically Signed   By: Elmer Picker M.D.   On: 12/24/2021 15:37   DG Ankle Complete Right  Result Date: 12/24/2021 CLINICAL DATA:  Swelling EXAM: RIGHT ANKLE - COMPLETE 3 VIEW  COMPARISON:  None Available. FINDINGS: There is no evidence of fracture, dislocation, or joint effusion. Mild degenerative changes of the tibiotalar joint and partially visualized midfoot. Prominent Achilles tendon enthesophyte. Soft tissue edema. IMPRESSION: 1. No acute osseous abnormality. 2. Soft tissue edema. Electronically Signed   By: Yetta Glassman M.D.   On: 12/24/2021 15:04   DG Ankle Complete Right  Result Date: 12/22/2021 CLINICAL DATA:  Right foot and ankle swelling. Lateral ankle wound. History of gout. EXAM: RIGHT ANKLE - COMPLETE 3+ VIEW COMPARISON:  Right foot radiographs 09/17/2004 FINDINGS: No acute fracture, dislocation, or destructive osseous process is identified. A well corticated ossicle is noted adjacent to the medial malleolus, potentially degenerative or related to remote trauma. Large posterior and small plantar calcaneal enthesophytes  are present. There is moderate to prominent soft tissue swelling about the ankle. No subcutaneous emphysema or radiopaque foreign body is identified. IMPRESSION: Soft tissue swelling without acute osseous abnormality identified. Electronically Signed   By: Logan Bores M.D.   On: 12/22/2021 11:29   DG Foot Complete Right  Result Date: 12/22/2021 CLINICAL DATA:  Foot and ankle pain and swelling with difficulty weight-bearing. EXAM: RIGHT FOOT COMPLETE - 3+ VIEW COMPARISON:  Radiographs 09/18/2004. FINDINGS: The bones appear diffusely demineralized. There is no evidence of acute fracture or dislocation. Mild degenerative changes are present at the metatarsophalangeal and interphalangeal joints of the great toe. There are mild midfoot degenerative changes. Chronic calcaneal spurring appears unchanged. No focal soft tissue abnormalities are identified. IMPRESSION: Osteopenia with mildly progressive forefoot and midfoot degenerative changes. No acute osseous findings identified. Electronically Signed   By: Richardean Sale M.D.   On: 12/22/2021 11:26    (Echo, Carotid, EGD, Colonoscopy, ERCP)    Subjective:  He feels better Was able to walk on the right foot this morning .  Right range of motion improved Discharge Exam: Vitals:   12/28/21 0443 12/28/21 0844  BP: 134/75 134/83  Pulse: 75 91  Resp: 19   Temp: (!) 97.5 F (36.4 C)   SpO2: 98%    Vitals:   12/27/21 1410 12/27/21 2027 12/28/21 0443 12/28/21 0844  BP: 103/70 124/72 134/75 134/83  Pulse: 82 81 75 91  Resp: '18 19 19   '$ Temp: 97.6 F (36.4 C) 97.8 F (36.6 C) (!) 97.5 F (36.4 C)   TempSrc: Oral Oral Oral   SpO2: 98% 95% 98%   Weight:      Height:        General: Pt is alert, awake, not in acute distress Cardiovascular: RRR, S1/S2 +, no rubs, no gallops Respiratory: CTA bilaterally, no wheezing, no rhonchi Abdominal: Soft, NT, ND, bowel sounds + Extremities: Right lower extremity decreased edema improvement in range of motion  The results of significant diagnostics from this hospitalization (including imaging, microbiology, ancillary and laboratory) are listed below for reference.     Microbiology: Recent Results (from the past 240 hour(s))  Novel Coronavirus, NAA ([facility])     Status: None   Collection Time: 12/19/21  9:47 AM   Specimen: Nasopharyngeal(NP) swabs in vial transport medium  Result Value Ref Range Status   SARS-CoV-2, NAA Not Detected Not Detected Final    Comment: This nucleic acid amplification test was developed and its performance characteristics determined by [employer]. Nucleic acid amplification tests include RT-PCR and TMA. This test has not been FDA cleared or approved. This test has been authorized by FDA under an Emergency Use Authorization (EUA). This test is only authorized for the duration of time the declaration that circumstances exist justifying the authorization of the emergency use of in vitro diagnostic tests for detection of SARS-CoV-2 virus and/or diagnosis of COVID-19 infection under section 564(b)(1) of  the Act, 21 U.S.C. 660YTK-1(S) (1), unless the authorization is terminated or revoked sooner. When diagnostic testing is negative, the possibility of a false negative result should be considered in the context of a patient's recent exposures and the presence of clinical signs and symptoms consistent with COVID-19. An individual without symptoms of COVID-19 and who is not shedding SARS-CoV-2 virus wo uld expect to have a negative (not detected) result in this assay.   Blood culture (routine x 2)     Status: None (Preliminary result)   Collection Time: 12/24/21  4:27 PM  Specimen: BLOOD  Result Value Ref Range Status   Specimen Description BLOOD BLOOD RIGHT ARM  Final   Special Requests   Final    BOTTLES DRAWN AEROBIC AND ANAEROBIC Blood Culture results may not be optimal due to an excessive volume of blood received in culture bottles   Culture   Final    NO GROWTH 4 DAYS Performed at [facility], [address] [place], [place] [address]    Report Status PENDING  Incomplete  Blood culture (routine x 2)     Status: None (Preliminary result)   Collection Time: 12/24/21  4:27 PM   Specimen: BLOOD  Result Value Ref Range Status   Specimen Description BLOOD BLOOD RIGHT HAND  Final   Special Requests   Final    BOTTLES DRAWN AEROBIC AND ANAEROBIC Blood Culture results may not be optimal due to an excessive volume of blood received in culture bottles   Culture   Final    NO GROWTH 4 DAYS Performed at [facility], [address], [place], [place] [address]    Report Status PENDING  Incomplete  MRSA Next Gen by PCR, Nasal     Status: None   Collection Time: 12/25/21 10:20 AM   Specimen: Nasal Mucosa; Nasal Swab  Result Value Ref Range Status   MRSA by PCR Next Gen NOT DETECTED NOT DETECTED Final    Comment: (NOTE) The GeneXpert MRSA Assay (FDA approved for NASAL specimens only), is one component of a comprehensive MRSA colonization surveillance program. It is not intended to  diagnose MRSA infection nor to guide or monitor treatment for MRSA infections. Test performance is not FDA approved in patients less than 32 years old. Performed at [facility], [address], [place], [place] [address]   Body fluid culture w Gram Stain     Status: None (Preliminary result)   Collection Time: 12/26/21  1:30 PM   Specimen: Synovium; Body Fluid  Result Value Ref Range Status   Specimen Description   Final    SYNOVIAL Performed at [facility], [address], [place], [place] [address]    Special Requests   Final    NONE Performed at [facility], [address] [place], [place] [address]    Gram Stain   Final    ABUNDANT WBC PRESENT, PREDOMINANTLY PMN NO ORGANISMS SEEN    Culture   Final    NO GROWTH 2 DAYS Performed at [facility], [place] [address] [place], [place] [address]    Report Status PENDING  Incomplete     Labs: BNP (last 3 results) No results for input(s): "BNP" in the last 8760 hours. Basic Metabolic Panel: Recent Labs  Lab 12/24/21 1626 12/25/21 0449 12/27/21 0445 12/28/21 0758  NA 134* 137 135 134*  K 4.7 3.6 4.6 4.0  CL 97* 102 97* 98  CO2 '28 27 28 26  '$ GLUCOSE 329* 109* 143* 173*  BUN 36* 32* 33* 34*  CREATININE 1.44* 1.21 1.50* 1.22  CALCIUM 8.5* 8.2* 8.9 8.6*   Liver Function Tests: Recent Labs  Lab 12/24/21 1626 12/27/21 0445 12/28/21 0758  AST 81* 54* 93*  ALT 81* 91* 127*  ALKPHOS 106 112 130*  BILITOT 0.8 1.2 0.8  PROT 7.4 7.8 7.8  ALBUMIN 2.9* 2.9* 2.8*   No results for input(s): "LIPASE", "AMYLASE" in the last 168 hours. No results for input(s): "AMMONIA" in the last 168 hours. CBC: Recent Labs  Lab 12/22/21 1206 12/24/21 1438 12/25/21 0449 12/27/21 0445  12/28/21 0758  WBC 15.9* 12.8* 12.2* 13.3* 18.2*  NEUTROABS 13.3* 10.9*  --   --   --   HGB 12.8* 13.1 11.7* 13.1 13.2  HCT 37.0* 39.5 35.0* 39.8 39.5  MCV 96 99.5 98.6 98.3 97.8  PLT 272 295 346 451* 487*   Cardiac Enzymes: No results for  input(s): "CKTOTAL", "CKMB", "CKMBINDEX", "TROPONINI" in the last 168 hours. BNP: Invalid input(s): "POCBNP" CBG: Recent Labs  Lab 12/27/21 1120 12/27/21 1637 12/27/21 2029 12/28/21 0713 12/28/21 1108  GLUCAP 217* 206* 175* 166* 216*   D-Dimer No results for input(s): "DDIMER" in the last 72 hours. Hgb A1c No results for input(s): "HGBA1C" in the last 72 hours. Lipid Profile No results for input(s): "CHOL", "HDL", "LDLCALC", "TRIG", "CHOLHDL", "LDLDIRECT" in the last 72 hours. Thyroid function studies No results for input(s): "TSH", "T4TOTAL", "T3FREE", "THYROIDAB" in the last 72 hours.  Invalid input(s): "FREET3" Anemia work up No results for input(s): "VITAMINB12", "FOLATE", "FERRITIN", "TIBC", "IRON", "RETICCTPCT" in the last 72 hours. Urinalysis    Component Value Date/Time   COLORURINE YELLOW 06/04/2015 1204   APPEARANCEUR Clear 04/19/2020 1349   LABSPEC 1.020 06/04/2015 1204   PHURINE 5.0 06/04/2015 1204   GLUCOSEU Negative 04/19/2020 1349   HGBUR NEGATIVE 06/04/2015 1204   BILIRUBINUR Negative 04/19/2020 1349   KETONESUR NEGATIVE 06/04/2015 1204   PROTEINUR 2+ (A) 04/19/2020 1349   PROTEINUR NEGATIVE 06/04/2015 1204   NITRITE Negative 04/19/2020 1349   NITRITE NEGATIVE 06/04/2015 1204   LEUKOCYTESUR Negative 04/19/2020 1349   Sepsis Labs Recent Labs  Lab 12/24/21 1438 12/25/21 0449 12/27/21 0445 12/28/21 0758  WBC 12.8* 12.2* 13.3* 18.2*   Microbiology Recent Results (from the past 240 hour(s))  Novel Coronavirus, NAA ([facility])     Status: None   Collection Time: 12/19/21  9:47 AM   Specimen: Nasopharyngeal(NP) swabs in vial transport medium  Result Value Ref Range Status   SARS-CoV-2, NAA Not Detected Not Detected Final    Comment: This nucleic acid amplification test was developed and its performance characteristics determined by [employer]. Nucleic acid amplification tests include RT-PCR and TMA. This test has not been FDA cleared or  approved. This test has been authorized by FDA under an Emergency Use Authorization (EUA). This test is only authorized for the duration of time the declaration that circumstances exist justifying the authorization of the emergency use of in vitro diagnostic tests for detection of SARS-CoV-2 virus and/or diagnosis of COVID-19 infection under section 564(b)(1) of the Act, 21 U.S.C. 161WRU-0(A) (1), unless the authorization is terminated or revoked sooner. When diagnostic testing is negative, the possibility of a false negative result should be considered in the context of a patient's recent exposures and the presence of clinical signs and symptoms consistent with COVID-19. An individual without symptoms of COVID-19 and who is not shedding SARS-CoV-2 virus wo uld expect to have a negative (not detected) result in this assay.   Blood culture (routine x 2)     Status: None (Preliminary result)   Collection Time: 12/24/21  4:27 PM   Specimen: BLOOD  Result Value Ref Range Status   Specimen Description BLOOD BLOOD RIGHT ARM  Final   Special Requests   Final    BOTTLES DRAWN AEROBIC AND ANAEROBIC Blood Culture results may not be optimal due to an excessive volume of blood received in culture bottles   Culture   Final    NO GROWTH 4 DAYS Performed at [facility], [address] [place], [place] [address]  Report Status PENDING  Incomplete  Blood culture (routine x 2)     Status: None (Preliminary result)   Collection Time: 12/24/21  4:27 PM   Specimen: BLOOD  Result Value Ref Range Status   Specimen Description BLOOD BLOOD RIGHT HAND  Final   Special Requests   Final    BOTTLES DRAWN AEROBIC AND ANAEROBIC Blood Culture results may not be optimal due to an excessive volume of blood received in culture bottles   Culture   Final    NO GROWTH 4 DAYS Performed at [facility], [address] [place], [place] [address]    Report Status PENDING  Incomplete  MRSA Next Gen by PCR, Nasal      Status: None   Collection Time: 12/25/21 10:20 AM   Specimen: Nasal Mucosa; Nasal Swab  Result Value Ref Range Status   MRSA by PCR Next Gen NOT DETECTED NOT DETECTED Final    Comment: (NOTE) The GeneXpert MRSA Assay (FDA approved for NASAL specimens only), is one component of a comprehensive MRSA colonization surveillance program. It is not intended to diagnose MRSA infection nor to guide or monitor treatment for MRSA infections. Test performance is not FDA approved in patients less than 57 years old. Performed at [facility], [address], [place], [place] [address]   Body fluid culture w Gram Stain     Status: None (Preliminary result)   Collection Time: 12/26/21  1:30 PM   Specimen: Synovium; Body Fluid  Result Value Ref Range Status   Specimen Description   Final    SYNOVIAL Performed at [facility], [address], [place], [place] [address]    Special Requests   Final    NONE Performed at [facility], [address], [place], [place] [address]    Gram Stain   Final    ABUNDANT WBC PRESENT, PREDOMINANTLY PMN NO ORGANISMS SEEN    Culture   Final    NO GROWTH 2 DAYS Performed at [facility], [place] [address], [place],  [address]    Report Status PENDING  Incomplete     Time coordinating discharge: 39 minutes  SIGNED  Georgette Shell, MD  [facility] 12/28/2021, 1:16 PM

## 2021-12-28 NOTE — Progress Notes (Signed)
Discharge instructions given patient and wife verbalized understanding. Dc'd via wheelchair to private vehicle.

## 2021-12-29 LAB — CULTURE, BLOOD (ROUTINE X 2)
Culture: NO GROWTH
Culture: NO GROWTH

## 2021-12-29 LAB — BODY FLUID CULTURE W GRAM STAIN: Culture: NO GROWTH

## 2021-12-30 ENCOUNTER — Telehealth: Payer: Self-pay | Admitting: *Deleted

## 2021-12-30 ENCOUNTER — Encounter: Payer: Self-pay | Admitting: *Deleted

## 2021-12-30 ENCOUNTER — Other Ambulatory Visit: Payer: Self-pay

## 2021-12-30 DIAGNOSIS — E1169 Type 2 diabetes mellitus with other specified complication: Secondary | ICD-10-CM

## 2021-12-30 NOTE — Patient Outreach (Signed)
  Care Coordination [facility] Note Transition Care Management Follow-up Telephone Call Date of discharge and from where: 12/28/21 from [facility]  How have you been since you were released from the hospital? "I feel alright. My foot hurts a little when I walk but it's better." Any questions or concerns? No  Items Reviewed: Did the pt receive and understand the discharge instructions provided? Yes  Medications obtained and verified? Yes  Other? Yes . Needs BMP and CBC within one week of discharge. Collaborated with PCP nurse regarding request for lab orders to be placed for this week since TCM f/u is next week.  Any new allergies since your discharge? No  Dietary orders reviewed? Yes Do you have support at home? Yes   Home Care and Equipment/Supplies: Were home health services ordered? no If so, what is the name of the agency? N/a  Has the agency set up a time to come to the patient's home? no Were any new equipment or medical supplies ordered?  No What is the name of the medical supply agency? N/a Were you able to get the supplies/equipment? not applicable Do you have any questions related to the use of the equipment or supplies? No  Functional Questionnaire: (I = Independent and D = Dependent) ADLs: I  Bathing/Dressing- I  Meal Prep- I  Eating- I  Maintaining continence- I  Transferring/Ambulation- I  Managing Meds- I  Follow up appointments reviewed:  PCP Hospital f/u appt confirmed? No  Collaborated with PCP nurse regarding appointment scheduling. She will reach out to schedule with Dr Lajuana Ripple on 01/08/22. [facility] f/u appt confirmed? No  RN Care Manager requested rheumatology referral from PCP. Are transportation arrangements needed? No  If their condition worsens, is the pt aware to call PCP or go to the Emergency Dept.? Yes Was the patient provided with contact information for the PCP's office or ED? Yes Was to pt encouraged to call back with  questions or concerns? Yes  SDOH assessments and interventions completed:   Yes  Care Coordination Interventions Activated:  Yes   Care Coordination Interventions:  PCP follow up appointment requested    Encounter Outcome:  Pt. Visit Completed    [name], BSN, RN-BC RN Care Coordinator Direct Dial: [phone]

## 2021-12-30 NOTE — Patient Outreach (Signed)
  Care Coordination [facility] Note Transition Care Management Unsuccessful Follow-up Telephone Call  Date of discharge and from where:  12/28/21 from [facility]  Attempts:  1st Attempt  Reason for unsuccessful TCM follow-up call:  Left voice message  [name], BSN, RN-BC RN Care Coordinator Direct Dial: [phone]

## 2022-01-03 ENCOUNTER — Other Ambulatory Visit: Payer: 59

## 2022-01-03 DIAGNOSIS — E1169 Type 2 diabetes mellitus with other specified complication: Secondary | ICD-10-CM | POA: Diagnosis not present

## 2022-01-04 LAB — CBC WITH DIFFERENTIAL/PLATELET
Basophils Absolute: 0.1 10*3/uL (ref 0.0–0.2)
Basos: 1 %
EOS (ABSOLUTE): 0.1 10*3/uL (ref 0.0–0.4)
Eos: 1 %
Hematocrit: 39.7 % (ref 37.5–51.0)
Hemoglobin: 13.4 g/dL (ref 13.0–17.7)
Immature Grans (Abs): 0.1 10*3/uL (ref 0.0–0.1)
Immature Granulocytes: 1 %
Lymphocytes Absolute: 2.4 10*3/uL (ref 0.7–3.1)
Lymphs: 17 %
MCH: 32.4 pg (ref 26.6–33.0)
MCHC: 33.8 g/dL (ref 31.5–35.7)
MCV: 96 fL (ref 79–97)
Monocytes Absolute: 1 10*3/uL — ABNORMAL HIGH (ref 0.1–0.9)
Monocytes: 7 %
Neutrophils Absolute: 10.1 10*3/uL — ABNORMAL HIGH (ref 1.4–7.0)
Neutrophils: 73 %
Platelets: 648 10*3/uL — ABNORMAL HIGH (ref 150–450)
RBC: 4.14 x10E6/uL (ref 4.14–5.80)
RDW: 12.2 % (ref 11.6–15.4)
WBC: 13.7 10*3/uL — ABNORMAL HIGH (ref 3.4–10.8)

## 2022-01-04 LAB — BMP8+EGFR
BUN/Creatinine Ratio: 19 (ref 10–24)
BUN: 21 mg/dL (ref 8–27)
CO2: 25 mmol/L (ref 20–29)
Calcium: 9.7 mg/dL (ref 8.6–10.2)
Chloride: 100 mmol/L (ref 96–106)
Creatinine, Ser: 1.12 mg/dL (ref 0.76–1.27)
Glucose: 129 mg/dL — ABNORMAL HIGH (ref 70–99)
Potassium: 5.3 mmol/L — ABNORMAL HIGH (ref 3.5–5.2)
Sodium: 140 mmol/L (ref 134–144)
eGFR: 75 mL/min/{1.73_m2} (ref >59)

## 2022-01-08 ENCOUNTER — Ambulatory Visit (INDEPENDENT_AMBULATORY_CARE_PROVIDER_SITE_OTHER): Payer: 59 | Admitting: Family Medicine

## 2022-01-08 ENCOUNTER — Encounter: Payer: Self-pay | Admitting: Family Medicine

## 2022-01-08 VITALS — BP 107/72 | HR 101 | Temp 96.9°F | Resp 20 | Ht 77.0 in | Wt 287.0 lb

## 2022-01-08 DIAGNOSIS — E1159 Type 2 diabetes mellitus with other circulatory complications: Secondary | ICD-10-CM | POA: Diagnosis not present

## 2022-01-08 DIAGNOSIS — I152 Hypertension secondary to endocrine disorders: Secondary | ICD-10-CM

## 2022-01-08 DIAGNOSIS — Z09 Encounter for follow-up examination after completed treatment for conditions other than malignant neoplasm: Secondary | ICD-10-CM | POA: Diagnosis not present

## 2022-01-08 DIAGNOSIS — M10071 Idiopathic gout, right ankle and foot: Secondary | ICD-10-CM | POA: Diagnosis not present

## 2022-01-08 DIAGNOSIS — L03115 Cellulitis of right lower limb: Secondary | ICD-10-CM | POA: Diagnosis not present

## 2022-01-08 MED ORDER — METOPROLOL TARTRATE 25 MG PO TABS: 25.0000 mg | ORAL_TABLET | Freq: Two times a day (BID) | ORAL | 3 refills | Status: DC

## 2022-01-08 MED ORDER — AMOXICILLIN-POT CLAVULANATE 500-125 MG PO TABS: 1.0000 | ORAL_TABLET | Freq: Three times a day (TID) | ORAL | 0 refills | Status: AC

## 2022-01-08 MED ORDER — LISINOPRIL 20 MG PO TABS: 20.0000 mg | ORAL_TABLET | Freq: Every day | ORAL | 3 refills | Status: DC

## 2022-01-08 MED ORDER — COLCHICINE 0.6 MG PO TABS: 0.6000 mg | ORAL_TABLET | Freq: Every day | ORAL | 0 refills | Status: DC
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
## 2022-01-09 LAB — BASIC METABOLIC PANEL
BUN/Creatinine Ratio: 14 (ref 10–24)
BUN: 19 mg/dL (ref 8–27)
CO2: 21 mmol/L (ref 20–29)
Calcium: 9.3 mg/dL (ref 8.6–10.2)
Chloride: 99 mmol/L (ref 96–106)
Creatinine, Ser: 1.32 mg/dL — ABNORMAL HIGH (ref 0.76–1.27)
Glucose: 145 mg/dL — ABNORMAL HIGH (ref 70–99)
Potassium: 4.7 mmol/L (ref 3.5–5.2)
Sodium: 137 mmol/L (ref 134–144)
eGFR: 62 mL/min/{1.73_m2} (ref >59)

## 2022-01-09 LAB — CBC
Hematocrit: 37.5 % (ref 37.5–51.0)
Hemoglobin: 12.1 g/dL — ABNORMAL LOW (ref 13.0–17.7)
MCH: 30.8 pg (ref 26.6–33.0)
MCHC: 32.3 g/dL (ref 31.5–35.7)
MCV: 95 fL (ref 79–97)
Platelets: 490 10*3/uL — ABNORMAL HIGH (ref 150–450)
RBC: 3.93 x10E6/uL — ABNORMAL LOW (ref 4.14–5.80)
RDW: 12.2 % (ref 11.6–15.4)
WBC: 9.3 10*3/uL (ref 3.4–10.8)

## 2022-01-09 LAB — URIC ACID: Uric Acid: 5.1 mg/dL (ref 3.8–8.4)

## 2022-01-12 ENCOUNTER — Other Ambulatory Visit: Payer: Self-pay | Admitting: Family Medicine

## 2022-01-21 ENCOUNTER — Ambulatory Visit: Payer: 59 | Admitting: Family Medicine

## 2022-01-27 ENCOUNTER — Telehealth: Payer: Self-pay | Admitting: "Endocrinology

## 2022-01-27 DIAGNOSIS — E1165 Type 2 diabetes mellitus with hyperglycemia: Secondary | ICD-10-CM

## 2022-01-27 DIAGNOSIS — E782 Mixed hyperlipidemia: Secondary | ICD-10-CM

## 2022-01-27 NOTE — Telephone Encounter (Signed)
Orders updated and sent to [facility]. 

## 2022-01-27 NOTE — Telephone Encounter (Signed)
Dr [name] Can you see this pt tomorrow with what labs are in chart?

## 2022-01-27 NOTE — Telephone Encounter (Signed)
Can you place labs for pt

## 2022-01-27 NOTE — Addendum Note (Signed)
Addended by: Ellin Saba on: 01/27/2022 04:57 PM   Modules accepted: Orders

## 2022-01-28 ENCOUNTER — Ambulatory Visit (INDEPENDENT_AMBULATORY_CARE_PROVIDER_SITE_OTHER): Payer: 59 | Admitting: "Endocrinology

## 2022-01-28 ENCOUNTER — Encounter: Payer: Self-pay | Admitting: "Endocrinology

## 2022-01-28 VITALS — BP 124/88 | HR 84 | Ht 77.0 in | Wt 290.6 lb

## 2022-01-28 DIAGNOSIS — E782 Mixed hyperlipidemia: Secondary | ICD-10-CM

## 2022-01-28 DIAGNOSIS — E1165 Type 2 diabetes mellitus with hyperglycemia: Secondary | ICD-10-CM | POA: Diagnosis not present

## 2022-01-28 DIAGNOSIS — E669 Obesity, unspecified: Secondary | ICD-10-CM | POA: Diagnosis not present

## 2022-01-28 DIAGNOSIS — I10 Essential (primary) hypertension: Secondary | ICD-10-CM | POA: Diagnosis not present
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
## 2022-01-28 NOTE — Patient Instructions (Signed)
                                     Advice for Weight Management  -For most of us the best way to lose weight is by diet management. Generally speaking, diet management means consuming less calories intentionally which over time brings about progressive weight loss.  This can be achieved more effectively by avoiding ultra processed carbohydrates, processed meats, unhealthy fats.    It is critically important to know your numbers: how much calorie you are consuming and how much calorie you need. More importantly, our carbohydrates sources should be unprocessed naturally occurring  complex starch food items.  It is always important to balance nutrition also by  appropriate intake of proteins (mainly plant-based), healthy fats/oils, plenty of fruits and vegetables.   -The American College of Lifestyle Medicine (ACL M) recommends nutrition derived mostly from Whole Food, Plant Predominant Sources example an apple instead of applesauce or apple pie. Eat Plenty of vegetables, Mushrooms, fruits, Legumes, Whole Grains, Nuts, seeds in lieu of processed meats, processed snacks/pastries red meat, poultry, eggs.  Use only water or unsweetened tea for hydration.  The College also recommends the need to stay away from risky substances including alcohol, smoking; obtaining 7-9 hours of restorative sleep, at least 150 minutes of moderate intensity exercise weekly, importance of healthy social connections, and being mindful of stress and seek help when it is overwhelming.    -Sticking to a routine mealtime to eat 3 meals a day and avoiding unnecessary snacks is shown to have a big role in weight control. Under normal circumstances, the only time we burn stored energy is when we are hungry, so allow  some hunger to take place- hunger means no food between appropriate meal times, only water.  It is not advisable to starve.   -It is better to avoid simple carbohydrates including:  Cakes, Sweet Desserts, Ice Cream, Soda (diet and regular), Sweet Tea, Candies, Chips, Cookies, Store Bought Juices, Alcohol in Excess of  1-2 drinks a day, Lemonade,  Artificial Sweeteners, Doughnuts, Coffee Creamers, "Sugar-free" Products, etc, etc.  This is not a complete list.....    -Consulting with certified diabetes educators is proven to provide you with the most accurate and current information on diet.  Also, you may be  interested in discussing diet options/exchanges , we can schedule a visit with Anthony Logan, RDN, CDE for individualized nutrition education.  -Exercise: If you are able: 30 -60 minutes a day ,4 days a week, or 150 minutes of moderate intensity exercise weekly.    The longer the better if tolerated.  Combine stretch, strength, and aerobic activities.  If you were told in the past that you have high risk for cardiovascular diseases, or if you are currently symptomatic, you may seek evaluation by your heart doctor prior to initiating moderate to intense exercise programs.                                  Additional Care Considerations for Diabetes/Prediabetes   -Diabetes  is a chronic disease.  The most important care consideration is regular follow-up with your diabetes care provider with the goal being avoiding or delaying its complications and to take advantage of advances in medications and technology.  If appropriate actions are taken early enough, type 2 diabetes can even be   reversed.  Seek information from the right source.  - Whole Food, Plant Predominant Nutrition is highly recommended: Eat Plenty of vegetables, Mushrooms, fruits, Legumes, Whole Grains, Nuts, seeds in lieu of processed meats, processed snacks/pastries red meat, poultry, eggs as recommended by American College of  Lifestyle Medicine (ACLM).  -Type 2 diabetes is known to coexist with other important comorbidities such as high blood pressure and high cholesterol.  It is critical to control not only the  diabetes but also the high blood pressure and high cholesterol to minimize and delay the risk of complications including coronary artery disease, stroke, amputations, blindness, etc.  The good news is that this diet recommendation for type 2 diabetes is also very helpful for managing high cholesterol and high blood blood pressure.  - Studies showed that people with diabetes will benefit from a class of medications known as ACE inhibitors and statins.  Unless there are specific reasons not to be on these medications, the standard of care is to consider getting one from these groups of medications at an optimal doses.  These medications are generally considered safe and proven to help protect the heart and the kidneys.    - People with diabetes are encouraged to initiate and maintain regular follow-up with eye doctors, foot doctors, dentists , and if necessary heart and kidney doctors.     - It is highly recommended that people with diabetes quit smoking or stay away from smoking, and get yearly  flu vaccine and pneumonia vaccine at least every 5 years.  See above for additional recommendations on exercise, sleep, stress management , and healthy social connections.      

## 2022-01-29 ENCOUNTER — Other Ambulatory Visit: Payer: Self-pay | Admitting: Family Medicine

## 2022-02-12 ENCOUNTER — Ambulatory Visit (INDEPENDENT_AMBULATORY_CARE_PROVIDER_SITE_OTHER): Payer: 59 | Admitting: Podiatry

## 2022-02-12 DIAGNOSIS — L97512 Non-pressure chronic ulcer of other part of right foot with fat layer exposed: Secondary | ICD-10-CM

## 2022-02-12 DIAGNOSIS — L97522 Non-pressure chronic ulcer of other part of left foot with fat layer exposed: Secondary | ICD-10-CM | POA: Diagnosis not present

## 2022-02-12 DIAGNOSIS — E1165 Type 2 diabetes mellitus with hyperglycemia: Secondary | ICD-10-CM
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
## 2022-02-14 ENCOUNTER — Telehealth: Payer: Self-pay | Admitting: *Deleted

## 2022-02-14 NOTE — Telephone Encounter (Signed)
-----   Message from Felipa Furnace, DPM sent at 02/12/2022 10:20 AM EDT ----- Regarding: Referral to the wound care center in [facility] Referral to the wound care center in [facility] I believe he is at [facility].  I will put the external order in

## 2022-02-14 NOTE — Telephone Encounter (Signed)
Faxed referral for wound care to [facility] wound healing in [place], confirmation received 02/14/22. Spoke with patient to inform that the referral had been sent.

## 2022-02-15 ENCOUNTER — Other Ambulatory Visit: Payer: Self-pay | Admitting: Family Medicine

## 2022-02-15 DIAGNOSIS — F32A Depression, unspecified: Secondary | ICD-10-CM

## 2022-02-20 ENCOUNTER — Ambulatory Visit: Payer: 59

## 2022-02-25 DIAGNOSIS — E11621 Type 2 diabetes mellitus with foot ulcer: Secondary | ICD-10-CM | POA: Diagnosis not present

## 2022-02-25 DIAGNOSIS — L97521 Non-pressure chronic ulcer of other part of left foot limited to breakdown of skin: Secondary | ICD-10-CM | POA: Diagnosis not present

## 2022-02-27 LAB — HM DIABETES EYE EXAM

## 2022-03-10 ENCOUNTER — Other Ambulatory Visit: Payer: Self-pay | Admitting: Family Medicine

## 2022-03-11 ENCOUNTER — Encounter: Payer: Self-pay | Admitting: Family Medicine

## 2022-03-11 NOTE — Telephone Encounter (Signed)
Letter sent.

## 2022-03-11 NOTE — Telephone Encounter (Signed)
[facility]. NTBS for his 6 mos FU has had to cancel recent appts. RF not sent

## 2022-03-13 ENCOUNTER — Other Ambulatory Visit: Payer: Self-pay | Admitting: Family Medicine

## 2022-03-19 ENCOUNTER — Encounter: Payer: Self-pay | Admitting: Family Medicine

## 2022-03-19 ENCOUNTER — Ambulatory Visit (INDEPENDENT_AMBULATORY_CARE_PROVIDER_SITE_OTHER): Payer: 59 | Admitting: Family Medicine

## 2022-03-19 VITALS — BP 138/85 | HR 90 | Temp 98.1°F | Ht 77.0 in | Wt 294.2 lb

## 2022-03-19 DIAGNOSIS — E11621 Type 2 diabetes mellitus with foot ulcer: Secondary | ICD-10-CM | POA: Diagnosis not present

## 2022-03-19 DIAGNOSIS — I700 Atherosclerosis of aorta: Secondary | ICD-10-CM

## 2022-03-19 DIAGNOSIS — E1159 Type 2 diabetes mellitus with other circulatory complications: Secondary | ICD-10-CM

## 2022-03-19 DIAGNOSIS — E1142 Type 2 diabetes mellitus with diabetic polyneuropathy: Secondary | ICD-10-CM | POA: Diagnosis not present

## 2022-03-19 DIAGNOSIS — I152 Hypertension secondary to endocrine disorders: Secondary | ICD-10-CM

## 2022-03-19 DIAGNOSIS — L97529 Non-pressure chronic ulcer of other part of left foot with unspecified severity: Secondary | ICD-10-CM

## 2022-03-19 DIAGNOSIS — Z23 Encounter for immunization: Secondary | ICD-10-CM | POA: Diagnosis not present

## 2022-03-19 DIAGNOSIS — E1169 Type 2 diabetes mellitus with other specified complication: Secondary | ICD-10-CM | POA: Diagnosis not present

## 2022-03-19 DIAGNOSIS — E785 Hyperlipidemia, unspecified: Secondary | ICD-10-CM

## 2022-03-19 DIAGNOSIS — Z8739 Personal history of other diseases of the musculoskeletal system and connective tissue: Secondary | ICD-10-CM

## 2022-03-19 DIAGNOSIS — F32A Depression, unspecified: Secondary | ICD-10-CM

## 2022-03-19 MED ORDER — COLCHICINE 0.6 MG PO TABS: 0.6000 mg | ORAL_TABLET | Freq: Every day | ORAL | 3 refills | Status: AC

## 2022-03-19 MED ORDER — ALLOPURINOL 300 MG PO TABS: 300.0000 mg | ORAL_TABLET | Freq: Every day | ORAL | 3 refills | Status: DC

## 2022-03-19 MED ORDER — LISINOPRIL 20 MG PO TABS: 20.0000 mg | ORAL_TABLET | Freq: Every day | ORAL | 3 refills | Status: DC

## 2022-03-19 MED ORDER — ATORVASTATIN CALCIUM 20 MG PO TABS: 20.0000 mg | ORAL_TABLET | Freq: Every day | ORAL | 3 refills | Status: DC

## 2022-03-19 MED ORDER — METOPROLOL TARTRATE 25 MG PO TABS: 25.0000 mg | ORAL_TABLET | Freq: Two times a day (BID) | ORAL | 3 refills | Status: DC

## 2022-03-19 MED ORDER — CYCLOBENZAPRINE HCL 10 MG PO TABS: 10.0000 mg | ORAL_TABLET | Freq: Every day | ORAL | 3 refills | Status: DC

## 2022-03-19 MED ORDER — GABAPENTIN 600 MG PO TABS: 600.0000 mg | ORAL_TABLET | Freq: Three times a day (TID) | ORAL | 3 refills | Status: DC

## 2022-03-19 MED ORDER — SILDENAFIL CITRATE 100 MG PO TABS: 100.0000 mg | ORAL_TABLET | Freq: Every day | ORAL | 99 refills | Status: AC | PRN

## 2022-03-19 MED ORDER — BUPROPION HCL ER (XL) 300 MG PO TB24: 300.0000 mg | ORAL_TABLET | Freq: Every day | ORAL | 3 refills | Status: DC
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
## 2022-03-20 LAB — LIPID PANEL
Chol/HDL Ratio: 3 ratio (ref 0.0–5.0)
Cholesterol, Total: 87 mg/dL — ABNORMAL LOW (ref 100–199)
HDL: 29 mg/dL — ABNORMAL LOW (ref >39)
LDL Chol Calc (NIH): 26 mg/dL (ref 0–99)
Triglycerides: 205 mg/dL — ABNORMAL HIGH (ref 0–149)
VLDL Cholesterol Cal: 32 mg/dL (ref 5–40)

## 2022-03-20 LAB — COMPREHENSIVE METABOLIC PANEL
ALT: 17 IU/L (ref 0–44)
AST: 20 IU/L (ref 0–40)
Albumin/Globulin Ratio: 1.8 (ref 1.2–2.2)
Albumin: 4.3 g/dL (ref 3.9–4.9)
Alkaline Phosphatase: 89 IU/L (ref 44–121)
BUN/Creatinine Ratio: 19 (ref 10–24)
BUN: 19 mg/dL (ref 8–27)
Bilirubin Total: 0.6 mg/dL (ref 0.0–1.2)
CO2: 27 mmol/L (ref 20–29)
Calcium: 9.5 mg/dL (ref 8.6–10.2)
Chloride: 103 mmol/L (ref 96–106)
Creatinine, Ser: 1.02 mg/dL (ref 0.76–1.27)
Globulin, Total: 2.4 g/dL (ref 1.5–4.5)
Glucose: 134 mg/dL — ABNORMAL HIGH (ref 70–99)
Potassium: 4.4 mmol/L (ref 3.5–5.2)
Sodium: 143 mmol/L (ref 134–144)
Total Protein: 6.7 g/dL (ref 6.0–8.5)
eGFR: 84 mL/min/{1.73_m2} (ref >59)

## 2022-03-20 LAB — BAYER DCA HB A1C WAIVED: HB A1C (BAYER DCA - WAIVED): 4.9 % (ref 4.8–5.6)

## 2022-03-20 LAB — MICROALBUMIN / CREATININE URINE RATIO
Creatinine, Urine: 303.2 mg/dL
Microalb/Creat Ratio: 478 mg/g{creat} — ABNORMAL HIGH (ref 0–29)
Microalbumin, Urine: 1450.3 ug/mL

## 2022-03-20 LAB — TSH: TSH: 0.617 u[IU]/mL (ref 0.450–4.500)

## 2022-03-20 LAB — T4, FREE: Free T4: 1.16 ng/dL (ref 0.82–1.77)

## 2022-03-20 LAB — SPECIMEN STATUS REPORT

## 2022-03-23 ENCOUNTER — Other Ambulatory Visit: Payer: Self-pay | Admitting: Family Medicine

## 2022-03-23 DIAGNOSIS — Z8739 Personal history of other diseases of the musculoskeletal system and connective tissue: Secondary | ICD-10-CM

## 2022-03-24 NOTE — Telephone Encounter (Signed)
Pharmacy comment: Colchicine causes synergistic or additive toxicity with HMG-CoA reductase inhibitors (ATORVASTATIN). Should PT remain on both medications?Please respond with appropriate changes or comment to Pharmacy. Colchicine causes synergistic or additive toxicity with HMG-CoA reductase inhibitors (ATORVASTATIN). Should PT remain on both medications?Please respond with appropriate changes or comment to Pharmacy.

## 2022-03-24 NOTE — Telephone Encounter (Signed)
This is an AS needed med so I don't anticipate him having an issue.  He is also on lower dose Atorvastatin so if there was a slight increased concentration I still think it would likely be ok.  Will cc to [facility] for more info.  At worst, ok to hold atorvastatin while taking colchicine for gout flareups

## 2022-04-03 ENCOUNTER — Encounter: Payer: Self-pay | Admitting: Internal Medicine

## 2022-04-03 ENCOUNTER — Ambulatory Visit: Payer: 59 | Attending: Internal Medicine | Admitting: Internal Medicine

## 2022-04-03 VITALS — BP 168/91 | HR 83 | Resp 15 | Ht 75.0 in | Wt 293.0 lb

## 2022-04-03 DIAGNOSIS — N1831 Chronic kidney disease, stage 3a: Secondary | ICD-10-CM | POA: Diagnosis not present

## 2022-04-03 DIAGNOSIS — M1A4790 Other secondary chronic gout, unspecified ankle and foot, without tophus (tophi): Secondary | ICD-10-CM | POA: Diagnosis not present

## 2022-04-03 DIAGNOSIS — L03115 Cellulitis of right lower limb: Secondary | ICD-10-CM

## 2022-04-03 LAB — SEDIMENTATION RATE: Sed Rate: 6 mm/h (ref 0–20)

## 2022-04-03 LAB — COMPLETE METABOLIC PANEL WITH GFR
AG Ratio: 1.5 (calc) (ref 1.0–2.5)
ALT: 17 U/L (ref 9–46)
AST: 19 U/L (ref 10–35)
Albumin: 4.2 g/dL (ref 3.6–5.1)
Alkaline phosphatase (APISO): 86 U/L (ref 35–144)
BUN: 15 mg/dL (ref 7–25)
CO2: 32 mmol/L (ref 20–32)
Calcium: 9.8 mg/dL (ref 8.6–10.3)
Chloride: 101 mmol/L (ref 98–110)
Creat: 1.26 mg/dL (ref 0.70–1.35)
Globulin: 2.8 g/dL (calc) (ref 1.9–3.7)
Glucose, Bld: 135 mg/dL — ABNORMAL HIGH (ref 65–99)
Potassium: 4.3 mmol/L (ref 3.5–5.3)
Sodium: 141 mmol/L (ref 135–146)
Total Bilirubin: 0.7 mg/dL (ref 0.2–1.2)
Total Protein: 7 g/dL (ref 6.1–8.1)
eGFR: 65 mL/min/{1.73_m2} (ref >=60)

## 2022-04-03 LAB — URIC ACID: Uric Acid, Serum: 4.4 mg/dL (ref 4.0–8.0)

## 2022-04-03 NOTE — Progress Notes (Signed)
Sedimentation rate is completely back to normal so no inflammation. Uric acid is 4.4 which is under our goal of 6.0. I do not recommend additional increases to allopurinol dose or switching medicines at this time. We can monitor this for now and take colchicine if needed as discussed. If he has any flare up not controlled with the current plan can contact us as needed.
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
## 2022-04-30 ENCOUNTER — Ambulatory Visit (INDEPENDENT_AMBULATORY_CARE_PROVIDER_SITE_OTHER): Payer: 59 | Admitting: "Endocrinology

## 2022-04-30 ENCOUNTER — Encounter: Payer: Self-pay | Admitting: "Endocrinology

## 2022-04-30 VITALS — BP 136/88 | HR 92 | Ht 75.0 in | Wt 289.4 lb

## 2022-04-30 DIAGNOSIS — I10 Essential (primary) hypertension: Secondary | ICD-10-CM

## 2022-04-30 DIAGNOSIS — Z6836 Body mass index (BMI) 36.0-36.9, adult: Secondary | ICD-10-CM

## 2022-04-30 DIAGNOSIS — E1165 Type 2 diabetes mellitus with hyperglycemia: Secondary | ICD-10-CM | POA: Diagnosis not present

## 2022-04-30 DIAGNOSIS — E782 Mixed hyperlipidemia: Secondary | ICD-10-CM

## 2022-04-30 NOTE — Patient Instructions (Signed)
                                     Advice for Weight Management  -For most of us the best way to lose weight is by diet management. Generally speaking, diet management means consuming less calories intentionally which over time brings about progressive weight loss.  This can be achieved more effectively by avoiding ultra processed carbohydrates, processed meats, unhealthy fats.    It is critically important to know your numbers: how much calorie you are consuming and how much calorie you need. More importantly, our carbohydrates sources should be unprocessed naturally occurring  complex starch food items.  It is always important to balance nutrition also by  appropriate intake of proteins (mainly plant-based), healthy fats/oils, plenty of fruits and vegetables.   -The American College of Lifestyle Medicine (ACL M) recommends nutrition derived mostly from Whole Food, Plant Predominant Sources example an apple instead of applesauce or apple pie. Eat Plenty of vegetables, Mushrooms, fruits, Legumes, Whole Grains, Nuts, seeds in lieu of processed meats, processed snacks/pastries red meat, poultry, eggs.  Use only water or unsweetened tea for hydration.  The College also recommends the need to stay away from risky substances including alcohol, smoking; obtaining 7-9 hours of restorative sleep, at least 150 minutes of moderate intensity exercise weekly, importance of healthy social connections, and being mindful of stress and seek help when it is overwhelming.    -Sticking to a routine mealtime to eat 3 meals a day and avoiding unnecessary snacks is shown to have a big role in weight control. Under normal circumstances, the only time we burn stored energy is when we are hungry, so allow  some hunger to take place- hunger means no food between appropriate meal times, only water.  It is not advisable to starve.   -It is better to avoid simple carbohydrates including:  Cakes, Sweet Desserts, Ice Cream, Soda (diet and regular), Sweet Tea, Candies, Chips, Cookies, Store Bought Juices, Alcohol in Excess of  1-2 drinks a day, Lemonade,  Artificial Sweeteners, Doughnuts, Coffee Creamers, "Sugar-free" Products, etc, etc.  This is not a complete list.....    -Consulting with certified diabetes educators is proven to provide you with the most accurate and current information on diet.  Also, you may be  interested in discussing diet options/exchanges , we can schedule a visit with Anthony Logan, RDN, CDE for individualized nutrition education.  -Exercise: If you are able: 30 -60 minutes a day ,4 days a week, or 150 minutes of moderate intensity exercise weekly.    The longer the better if tolerated.  Combine stretch, strength, and aerobic activities.  If you were told in the past that you have high risk for cardiovascular diseases, or if you are currently symptomatic, you may seek evaluation by your heart doctor prior to initiating moderate to intense exercise programs.                                  Additional Care Considerations for Diabetes/Prediabetes   -Diabetes  is a chronic disease.  The most important care consideration is regular follow-up with your diabetes care provider with the goal being avoiding or delaying its complications and to take advantage of advances in medications and technology.  If appropriate actions are taken early enough, type 2 diabetes can even be   reversed.  Seek information from the right source.  - Whole Food, Plant Predominant Nutrition is highly recommended: Eat Plenty of vegetables, Mushrooms, fruits, Legumes, Whole Grains, Nuts, seeds in lieu of processed meats, processed snacks/pastries red meat, poultry, eggs as recommended by American College of  Lifestyle Medicine (ACLM).  -Type 2 diabetes is known to coexist with other important comorbidities such as high blood pressure and high cholesterol.  It is critical to control not only the  diabetes but also the high blood pressure and high cholesterol to minimize and delay the risk of complications including coronary artery disease, stroke, amputations, blindness, etc.  The good news is that this diet recommendation for type 2 diabetes is also very helpful for managing high cholesterol and high blood blood pressure.  - Studies showed that people with diabetes will benefit from a class of medications known as ACE inhibitors and statins.  Unless there are specific reasons not to be on these medications, the standard of care is to consider getting one from these groups of medications at an optimal doses.  These medications are generally considered safe and proven to help protect the heart and the kidneys.    - People with diabetes are encouraged to initiate and maintain regular follow-up with eye doctors, foot doctors, dentists , and if necessary heart and kidney doctors.     - It is highly recommended that people with diabetes quit smoking or stay away from smoking, and get yearly  flu vaccine and pneumonia vaccine at least every 5 years.  See above for additional recommendations on exercise, sleep, stress management , and healthy social connections.      
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
## 2022-05-14 ENCOUNTER — Ambulatory Visit: Payer: 59 | Admitting: Podiatry

## 2022-05-27 DIAGNOSIS — L97522 Non-pressure chronic ulcer of other part of left foot with fat layer exposed: Secondary | ICD-10-CM | POA: Diagnosis not present

## 2022-05-27 DIAGNOSIS — E08621 Diabetes mellitus due to underlying condition with foot ulcer: Secondary | ICD-10-CM | POA: Diagnosis not present

## 2022-05-28 ENCOUNTER — Ambulatory Visit (INDEPENDENT_AMBULATORY_CARE_PROVIDER_SITE_OTHER): Payer: 59 | Admitting: Podiatry

## 2022-05-28 VITALS — BP 128/70

## 2022-05-28 DIAGNOSIS — E1165 Type 2 diabetes mellitus with hyperglycemia: Secondary | ICD-10-CM | POA: Diagnosis not present

## 2022-05-28 DIAGNOSIS — L97522 Non-pressure chronic ulcer of other part of left foot with fat layer exposed: Secondary | ICD-10-CM
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
## 2022-05-30 ENCOUNTER — Telehealth: Payer: 59 | Admitting: Nurse Practitioner

## 2022-05-30 DIAGNOSIS — J069 Acute upper respiratory infection, unspecified: Secondary | ICD-10-CM

## 2022-05-30 NOTE — Progress Notes (Signed)
We are sorry that you are not feeling well.  Here is how we plan to help!  Based on your presentation I believe you most likely have A cough due to a virus.  This is called viral bronchitis and is best treated by rest, plenty of fluids and control of the cough.  You may use Ibuprofen or Tylenol as directed to help your symptoms.     Based on your history and allergies prescription cough medication is not recommended. We would suggest using plain Mucinex over the counter.  Increasing fluids  Rest  Avoiding respiratory irritants like smoke  Following up if symptoms persist or worsen   From your responses in the eVisit questionnaire you describe inflammation in the upper respiratory tract which is causing a significant cough.  This is commonly called Bronchitis and has four common causes:   Allergies Viral Infections Acid Reflux Bacterial Infection Allergies, viruses and acid reflux are treated by controlling symptoms or eliminating the cause. An example might be a cough caused by taking certain blood pressure medications. You stop the cough by changing the medication. Another example might be a cough caused by acid reflux. Controlling the reflux helps control the cough.     HOME CARE Only take medications as instructed by your medical team. Complete the entire course of an antibiotic. Drink plenty of fluids and get plenty of rest. Avoid close contacts especially the very young and the elderly Cover your mouth if you cough or cough into your sleeve. Always remember to wash your hands A steam or ultrasonic humidifier can help congestion.   GET HELP RIGHT AWAY IF: You develop worsening fever. You become short of breath You cough up blood. Your symptoms persist after you have completed your treatment plan MAKE SURE YOU  Understand these instructions. Will watch your condition. Will get help right away if you are not doing well or get worse.    Thank you for choosing an  e-visit.  Your e-visit answers were reviewed by a board certified advanced clinical practitioner to complete your personal care plan. Depending upon the condition, your plan could have included both over the counter or prescription medications.  Please review your pharmacy choice. Make sure the pharmacy is open so you can pick up prescription now. If there is a problem, you may contact your provider through [employer] and have the prescription routed to another pharmacy.  Your safety is important to [place]. If you have drug allergies check your prescription carefully.   For the next 24 hours you can use MyChart to ask questions about today's visit, request a non-urgent call back, or ask for a work or school excuse. You will get an email in the next two days asking about your experience. I hope that your e-visit has been valuable and will speed your recovery.   I spent approximately 5 minutes reviewing the patient's history, current symptoms and coordinating their care today.

## 2022-06-02 ENCOUNTER — Encounter: Payer: Self-pay | Admitting: Nurse Practitioner

## 2022-06-02 ENCOUNTER — Telehealth (INDEPENDENT_AMBULATORY_CARE_PROVIDER_SITE_OTHER): Payer: 59 | Admitting: Nurse Practitioner

## 2022-06-02 DIAGNOSIS — J0100 Acute maxillary sinusitis, unspecified: Secondary | ICD-10-CM | POA: Diagnosis not present

## 2022-06-02 MED ORDER — AMOXICILLIN-POT CLAVULANATE 875-125 MG PO TABS: 1.0000 | ORAL_TABLET | Freq: Two times a day (BID) | ORAL | 0 refills | Status: DC

## 2022-06-02 NOTE — Progress Notes (Signed)
Virtual Visit Consent   [name], you are scheduled for a virtual visit with [name], [place], a [facility] provider, today.     Just as with appointments in the office, your consent must be obtained to participate.  Your consent will be active for this visit and any virtual visit you may have with one of our providers in the next 365 days.     If you have a MyChart account, a copy of this consent can be sent to you electronically.  All virtual visits are billed to your insurance company just like a traditional visit in the office.    As this is a virtual visit, video technology does not allow for your provider to perform a traditional examination.  This may limit your provider's ability to fully assess your condition.  If your provider identifies any concerns that need to be evaluated in person or the need to arrange testing (such as labs, EKG, etc.), we will make arrangements to do so.     Although advances in technology are sophisticated, we cannot ensure that it will always work on either your end or our end.  If the connection with a video visit is poor, the visit may have to be switched to a telephone visit.  With either a video or telephone visit, we are not always able to ensure that we have a secure connection.     I need to obtain your verbal consent now.   Are you willing to proceed with your visit today? YES   MCKYLE SOLANKI has provided verbal consent on 06/02/2022 for a virtual visit (video or telephone).   [name], FNP   Date: 06/02/2022 8:57 AM   Virtual Visit via Video Note   I, [name], connected with Anthony Logan ([id number], Nov 04, 1960) on 06/02/22 at  4:30 PM EST by a video-enabled telemedicine application and verified that I am speaking with the correct person using two identifiers.  Location: Patient: Virtual Visit Location Patient: Home Provider: Virtual Visit Location Provider: Mobile   I discussed the limitations of  evaluation and management by telemedicine and the availability of in person appointments. The patient expressed understanding and agreed to proceed.    History of Present Illness: Anthony Logan is a 62 y.o. who identifies as a male who was assigned male at birth, and is being seen today for sinusitis.  HPI: Sinusitis This is a new problem. The current episode started in the past 7 days. The problem has been gradually worsening since onset. The maximum temperature recorded prior to his arrival was 100.4 - 100.9 F. The fever has been present for 1 to 2 days. His pain is at a severity of 6/10. Associated symptoms include congestion, coughing and sinus pressure. Pertinent negatives include no shortness of breath. Past treatments include acetaminophen (sudafed and flonase). The treatment provided no relief.    Review of Systems  HENT:  Positive for congestion and sinus pressure.   Respiratory:  Positive for cough. Negative for shortness of breath.     Problems:  Patient Active Problem List   Diagnosis Date Noted   Hyperglycemia 12/24/2021   Anxiety 02/07/2021   Aortic regurgitation 02/07/2021   Callous ulcer ([place]) 02/07/2021   Chronic kidney disease 02/07/2021   Diabetic ulcer of left great toe ([place]) 02/07/2021   Foot arch pain 02/07/2021   Foot drop, left 02/07/2021   Right knee injury 02/07/2021   Sleep apnea 02/07/2021   Depression 02/07/2021  Erectile dysfunction due to arterial insufficiency 04/19/2020   Cellulitis of right leg 10/07/2019   Uncontrolled type 2 diabetes mellitus with hyperglycemia ([place]) 04/28/2019   Essential hypertension, benign 04/28/2019   Diabetic ulcer of toe of left foot associated with type 2 diabetes mellitus, limited to breakdown of skin ([place]) 03/23/2019   Neuropathy 02/12/2018   Gout 05/05/2017   Renal cell cancer, right ([place]) 07/10/2016   T2DM (type 2 diabetes mellitus) ([place]) 11/22/2015   Abnormal MRI, lumbar spine 06/16/2015   Radiculopathy of lumbar  region 06/14/2015   Goiter 06/13/2015   Mixed hyperlipidemia 03/27/2015   Class 2 severe obesity due to excess calories with serious comorbidity and body mass index (BMI) of 36.0 to 36.9 in adult [facility]) 03/27/2015   Mild dilation of ascending aorta ([place]) 06/01/2013   Hypertension associated with diabetes ([place]) 02/15/2013   Precordial pain 02/15/2013   Abnormal ECG 02/15/2013   Hyperlipidemia associated with type 2 diabetes mellitus ([place]) 02/15/2013    Allergies:  Allergies  Allergen Reactions   Diltiazem Swelling    Wt gain,swelling hands,feet,gum bleeding   Clonidine Derivatives Swelling   Procardia [Nifedipine] Swelling    Swelling on feet and legs    Betadine [Povidone-Iodine] Rash   Medications:  Current Outpatient Medications:    Accu-Chek FastClix Lancets MISC, USE TO TEST GLUCOSE ONCE A DAY AS DIRECTED, Disp: 102 each, Rfl: 2   acetaminophen (TYLENOL) 325 MG tablet, Take 2 tablets (650 mg total) by mouth every 6 (six) hours as needed for mild pain (or Fever >/= 101)., Disp: , Rfl:    allopurinol (ZYLOPRIM) 300 MG tablet, Take 1 tablet (300 mg total) by mouth daily., Disp: 90 tablet, Rfl: 3   Alpha-Lipoic Acid 600 MG CAPS, Take 1 capsule (600 mg total) by mouth daily. For diabetic neuropathy, Disp: 90 capsule, Rfl: 1   aspirin 81 MG chewable tablet, Chew 81 mg by mouth daily., Disp: , Rfl:    atorvastatin (LIPITOR) 20 MG tablet, Take 1 tablet (20 mg total) by mouth daily., Disp: 90 tablet, Rfl: 3   buPROPion (WELLBUTRIN XL) 300 MG 24 hr tablet, Take 1 tablet (300 mg total) by mouth daily., Disp: 90 tablet, Rfl: 3   cholecalciferol (VITAMIN D) 1000 units tablet, Take 1,000 Units by mouth daily., Disp: , Rfl:    colchicine 0.6 MG tablet, Take 1 tablet (0.6 mg total) by mouth daily. (Patient taking differently: Take 0.6 mg by mouth 2 (two) times daily as needed.), Disp: 90 tablet, Rfl: 3   cyclobenzaprine (FLEXERIL) 10 MG tablet, Take 1 tablet (10 mg total) by mouth at bedtime. PUT  ONF ILE, Disp: 90 tablet, Rfl: 3   gabapentin (NEURONTIN) 600 MG tablet, Take 1 tablet (600 mg total) by mouth 3 (three) times daily., Disp: 270 tablet, Rfl: 3   hydrALAZINE (APRESOLINE) 50 MG tablet, TAKE 1 TABLET 3 TIMES A DAY(DOSE INCREASE) (Patient taking differently: Take 50 mg by mouth 3 (three) times daily.), Disp: 270 tablet, Rfl: 3   leptospermum manuka honey (MEDIHONEY) PSTE paste, Apply 1 Application topically daily., Disp: 44 mL, Rfl: 2   lisinopril (ZESTRIL) 20 MG tablet, Take 1 tablet (20 mg total) by mouth daily., Disp: 90 tablet, Rfl: 3   metoprolol tartrate (LOPRESSOR) 25 MG tablet, Take 1 tablet (25 mg total) by mouth 2 (two) times daily., Disp: 180 tablet, Rfl: 3   Multiple Vitamin (MULTIVITAMIN ADULT PO), Take 1 tablet by mouth daily., Disp: , Rfl:    saccharomyces boulardii (FLORASTOR) 250 MG capsule,  Take 1 capsule (250 mg total) by mouth 2 (two) times daily., Disp: 60 capsule, Rfl: 0   Semaglutide, 2 MG/DOSE, 8 MG/3ML SOPN, Inject 2 mg as directed once a week., Disp: 3 mL, Rfl: 3   sildenafil (VIAGRA) 100 MG tablet, Take 1 tablet (100 mg total) by mouth daily as needed for erectile dysfunction (PUT ON FILE)., Disp: 6 tablet, Rfl: PRN  Observations/Objective: Patient is well-developed, well-nourished in no acute distress.  Resting comfortably  at home.  Head is normocephalic, atraumatic.  No labored breathing.  Speech is clear and coherent with logical content.  Patient is alert and oriented at baseline.  Raspy voice Dry cough  Assessment and Plan:  [name] in today with chief complaint of Sinusitis   1. Acute non-recurrent maxillary sinusitis 1. Take meds as prescribed 2. Use a cool mist humidifier especially during the winter months and when heat has been humid. 3. Use saline nose sprays frequently 4. Saline irrigations of the nose can be very helpful if done frequently.  * 4X daily for 1 week*  * Use of a nettie pot can be helpful with this. Follow  directions with this* 5. Drink plenty of fluids 6. Keep thermostat turn down low 7.For any cough or congestion- mucinex d OTC 8. For fever or aces or pains- take tylenol or ibuprofen appropriate for age and weight.  * for fevers greater than 101 orally you may alternate ibuprofen and tylenol every  3 hours.    Meds ordered this encounter  Medications   amoxicillin-clavulanate (AUGMENTIN) 875-125 MG tablet    Sig: Take 1 tablet by mouth 2 (two) times daily.    Dispense:  14 tablet    Refill:  0    Order Specific Question:   Supervising Provider    Answer:   Caryl Pina A [[id number]]         Follow Up Instructions: I discussed the assessment and treatment plan with the patient. The patient was provided an opportunity to ask questions and all were answered. The patient agreed with the plan and demonstrated an understanding of the instructions.  A copy of instructions were sent to the patient via MyChart.  The patient was advised to call back or seek an in-person evaluation if the symptoms worsen or if the condition fails to improve as anticipated.  Time:  I spent 7 minutes with the patient via telehealth technology discussing the above problems/concerns.    [name], FNP

## 2022-06-10 DIAGNOSIS — L97522 Non-pressure chronic ulcer of other part of left foot with fat layer exposed: Secondary | ICD-10-CM | POA: Diagnosis not present

## 2022-06-10 DIAGNOSIS — E11621 Type 2 diabetes mellitus with foot ulcer: Secondary | ICD-10-CM | POA: Diagnosis not present

## 2022-06-10 DIAGNOSIS — L97529 Non-pressure chronic ulcer of other part of left foot with unspecified severity: Secondary | ICD-10-CM | POA: Diagnosis not present

## 2022-06-12 ENCOUNTER — Other Ambulatory Visit: Payer: Self-pay | Admitting: "Endocrinology

## 2022-06-18 ENCOUNTER — Ambulatory Visit (INDEPENDENT_AMBULATORY_CARE_PROVIDER_SITE_OTHER): Payer: 59

## 2022-06-18 VITALS — Ht 77.0 in | Wt 294.0 lb

## 2022-06-18 DIAGNOSIS — Z0000 Encounter for general adult medical examination without abnormal findings: Secondary | ICD-10-CM | POA: Diagnosis not present
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
## 2022-06-18 NOTE — Patient Instructions (Addendum)
Anthony Logan , Thank you for taking time to come for your Medicare Wellness Visit. I appreciate your ongoing commitment to your health goals. Please review the following plan we discussed and let me know if I can assist you in the future.   These are the goals we discussed:  Goals      DIET - INCREASE WATER INTAKE     Try to drink 6-8 glasses of water daily     Prevent falls     Loose weight for better control of HTN and DM  Stay active         This is a list of the screening recommended for you and due dates:  Health Maintenance  Topic Date Due   COVID-19 Vaccine (5 - 2023-24 season) 01/17/2022   Zoster (Shingles) Vaccine (1 of 2) 06/19/2022*   Complete foot exam   07/18/2022   Hemoglobin A1C  09/17/2022   Eye exam for diabetics  02/28/2023   Yearly kidney health urinalysis for diabetes  03/20/2023   Yearly kidney function blood test for diabetes  04/04/2023   Medicare Annual Wellness Visit  06/19/2023   Cologuard (Stool DNA test)  08/12/2024   DTaP/Tdap/Td vaccine (4 - Td or Tdap) 12/26/2031   Flu Shot  Completed   HPV Vaccine  Aged Out   Hepatitis C Screening: USPSTF Recommendation to screen - Ages 18-79 yo.  Discontinued   HIV Screening  Discontinued  *Topic was postponed. The date shown is not the original due date.    Advanced directives: Forms are available if you choose in the future to pursue completion.  This is recommended in order to make sure that your health wishes are honored in the event that you are unable to verbalize them to the provider.    Conditions/risks identified: Aim for 30 minutes of exercise or brisk walking, 6-8 glasses of water, and 5 servings of fruits and vegetables each day.   Next appointment: Follow up in one year for your annual wellness visit   Preventive Care 40-64 Years, Male Preventive care refers to lifestyle choices and visits with your health care provider that can promote health and wellness. What does preventive care include? A  yearly physical exam. This is also called an annual well check. Dental exams once or twice a year. Routine eye exams. Ask your health care provider how often you should have your eyes checked. Personal lifestyle choices, including: Daily care of your teeth and gums. Regular physical activity. Eating a healthy diet. Avoiding tobacco and drug use. Limiting alcohol use. Practicing safe sex. Taking low-dose aspirin every day starting at age 34. What happens during an annual well check? The services and screenings done by your health care provider during your annual well check will depend on your age, overall health, lifestyle risk factors, and family history of disease. Counseling  Your health care provider may ask you questions about your: Alcohol use. Tobacco use. Drug use. Emotional well-being. Home and relationship well-being. Sexual activity. Eating habits. Work and work Statistician. Screening  You may have the following tests or measurements: Height, weight, and BMI. Blood pressure. Lipid and cholesterol levels. These may be checked every 5 years, or more frequently if you are over 61 years old. Skin check. Lung cancer screening. You may have this screening every year starting at age 1 if you have a 30-pack-year history of smoking and currently smoke or have quit within the past 15 years. Fecal occult blood test (FOBT) of the stool. You  may have this test every year starting at age 18. Flexible sigmoidoscopy or colonoscopy. You may have a sigmoidoscopy every 5 years or a colonoscopy every 10 years starting at age 71. Prostate cancer screening. Recommendations will vary depending on your family history and other risks. Hepatitis C blood test. Hepatitis B blood test. Sexually transmitted disease (STD) testing. Diabetes screening. This is done by checking your blood sugar (glucose) after you have not eaten for a while (fasting). You may have this done every 1-3 years. Discuss  your test results, treatment options, and if necessary, the need for more tests with your health care provider. Vaccines  Your health care provider may recommend certain vaccines, such as: Influenza vaccine. This is recommended every year. Tetanus, diphtheria, and acellular pertussis (Tdap, Td) vaccine. You may need a Td booster every 10 years. Zoster vaccine. You may need this after age 64. Pneumococcal 13-valent conjugate (PCV13) vaccine. You may need this if you have certain conditions and have not been vaccinated. Pneumococcal polysaccharide (PPSV23) vaccine. You may need one or two doses if you smoke cigarettes or if you have certain conditions. Talk to your health care provider about which screenings and vaccines you need and how often you need them. This information is not intended to replace advice given to you by your health care provider. Make sure you discuss any questions you have with your health care provider. Document Released: 06/01/2015 Document Revised: 01/23/2016 Document Reviewed: 03/06/2015 Elsevier Interactive Patient Education  2017 [employer] Prevention in the Home Falls can cause injuries. They can happen to people of all ages. There are many things you can do to make your home safe and to help prevent falls. What can I do on the outside of my home? Regularly fix the edges of walkways and driveways and fix any cracks. Remove anything that might make you trip as you walk through a door, such as a raised step or threshold. Trim any bushes or trees on the path to your home. Use bright outdoor lighting. Clear any walking paths of anything that might make someone trip, such as rocks or tools. Regularly check to see if handrails are loose or broken. Make sure that both sides of any steps have handrails. Any raised decks and porches should have guardrails on the edges. Have any leaves, snow, or ice cleared regularly. Use sand or salt on walking paths during  winter. Clean up any spills in your garage right away. This includes oil or grease spills. What can I do in the bathroom? Use night lights. Install grab bars by the toilet and in the tub and shower. Do not use towel bars as grab bars. Use non-skid mats or decals in the tub or shower. If you need to sit down in the shower, use a plastic, non-slip stool. Keep the floor dry. Clean up any water that spills on the floor as soon as it happens. Remove soap buildup in the tub or shower regularly. Attach bath mats securely with double-sided non-slip rug tape. Do not have throw rugs and other things on the floor that can make you trip. What can I do in the bedroom? Use night lights. Make sure that you have a light by your bed that is easy to reach. Do not use any sheets or blankets that are too big for your bed. They should not hang down onto the floor. Have a firm chair that has side arms. You can use this for support while you get dressed.  Do not have throw rugs and other things on the floor that can make you trip. What can I do in the kitchen? Clean up any spills right away. Avoid walking on wet floors. Keep items that you use a lot in easy-to-reach places. If you need to reach something above you, use a strong step stool that has a grab bar. Keep electrical cords out of the way. Do not use floor polish or wax that makes floors slippery. If you must use wax, use non-skid floor wax. Do not have throw rugs and other things on the floor that can make you trip. What can I do with my stairs? Do not leave any items on the stairs. Make sure that there are handrails on both sides of the stairs and use them. Fix handrails that are broken or loose. Make sure that handrails are as long as the stairways. Check any carpeting to make sure that it is firmly attached to the stairs. Fix any carpet that is loose or worn. Avoid having throw rugs at the top or bottom of the stairs. If you do have throw rugs,  attach them to the floor with carpet tape. Make sure that you have a light switch at the top of the stairs and the bottom of the stairs. If you do not have them, ask someone to add them for you. What else can I do to help prevent falls? Wear shoes that: Do not have high heels. Have rubber bottoms. Are comfortable and fit you well. Are closed at the toe. Do not wear sandals. If you use a stepladder: Make sure that it is fully opened. Do not climb a closed stepladder. Make sure that both sides of the stepladder are locked into place. Ask someone to hold it for you, if possible. Clearly mark and make sure that you can see: Any grab bars or handrails. First and last steps. Where the edge of each step is. Use tools that help you move around (mobility aids) if they are needed. These include: Canes. Walkers. Scooters. Crutches. Turn on the lights when you go into a dark area. Replace any light bulbs as soon as they burn out. Set up your furniture so you have a clear path. Avoid moving your furniture around. If any of your floors are uneven, fix them. If there are any pets around you, be aware of where they are. Review your medicines with your doctor. Some medicines can make you feel dizzy. This can increase your chance of falling. Ask your doctor what other things that you can do to help prevent falls. This information is not intended to replace advice given to you by your health care provider. Make sure you discuss any questions you have with your health care provider. Document Released: 03/01/2009 Document Revised: 10/11/2015 Document Reviewed: 06/09/2014 Elsevier Interactive Patient Education  2017 [employer].

## 2022-07-01 DIAGNOSIS — L97529 Non-pressure chronic ulcer of other part of left foot with unspecified severity: Secondary | ICD-10-CM | POA: Diagnosis not present

## 2022-07-01 DIAGNOSIS — E11621 Type 2 diabetes mellitus with foot ulcer: Secondary | ICD-10-CM | POA: Diagnosis not present

## 2022-07-02 ENCOUNTER — Other Ambulatory Visit: Payer: Self-pay

## 2022-07-07 ENCOUNTER — Other Ambulatory Visit: Payer: Self-pay | Admitting: Cardiology

## 2022-07-15 DIAGNOSIS — E11621 Type 2 diabetes mellitus with foot ulcer: Secondary | ICD-10-CM | POA: Diagnosis not present

## 2022-07-15 DIAGNOSIS — L97522 Non-pressure chronic ulcer of other part of left foot with fat layer exposed: Secondary | ICD-10-CM | POA: Diagnosis not present

## 2022-07-22 ENCOUNTER — Other Ambulatory Visit: Payer: Self-pay | Admitting: *Deleted

## 2022-07-22 ENCOUNTER — Telehealth: Payer: Self-pay | Admitting: Cardiology

## 2022-07-22 DIAGNOSIS — I712 Thoracic aortic aneurysm, without rupture, unspecified: Secondary | ICD-10-CM

## 2022-07-22 NOTE — Telephone Encounter (Signed)
Checking percert on the following patient for testing scheduled at [facility].    CT ANGIO CHEST AORTA W/CM    11/28/2022

## 2022-08-05 DIAGNOSIS — E11621 Type 2 diabetes mellitus with foot ulcer: Secondary | ICD-10-CM | POA: Diagnosis not present

## 2022-08-05 DIAGNOSIS — L97522 Non-pressure chronic ulcer of other part of left foot with fat layer exposed: Secondary | ICD-10-CM | POA: Diagnosis not present

## 2022-08-19 DIAGNOSIS — L97522 Non-pressure chronic ulcer of other part of left foot with fat layer exposed: Secondary | ICD-10-CM | POA: Diagnosis not present

## 2022-08-19 DIAGNOSIS — L98492 Non-pressure chronic ulcer of skin of other sites with fat layer exposed: Secondary | ICD-10-CM | POA: Diagnosis not present

## 2022-08-19 DIAGNOSIS — E11621 Type 2 diabetes mellitus with foot ulcer: Secondary | ICD-10-CM | POA: Diagnosis not present

## 2022-09-03 ENCOUNTER — Ambulatory Visit: Payer: 59 | Admitting: Podiatry

## 2022-09-04 ENCOUNTER — Other Ambulatory Visit: Payer: Self-pay | Admitting: "Endocrinology

## 2022-09-11 ENCOUNTER — Ambulatory Visit: Payer: 59 | Admitting: Podiatry

## 2022-09-23 ENCOUNTER — Telehealth (INDEPENDENT_AMBULATORY_CARE_PROVIDER_SITE_OTHER): Payer: 59 | Admitting: Nurse Practitioner

## 2022-09-23 ENCOUNTER — Encounter: Payer: Self-pay | Admitting: Family Medicine

## 2022-09-23 ENCOUNTER — Encounter: Payer: Self-pay | Admitting: Nurse Practitioner

## 2022-09-23 DIAGNOSIS — R112 Nausea with vomiting, unspecified: Secondary | ICD-10-CM | POA: Diagnosis not present

## 2022-09-23 MED ORDER — ONDANSETRON 4 MG PO TBDP: 4.0000 mg | ORAL_TABLET | Freq: Three times a day (TID) | ORAL | 0 refills | Status: DC | PRN

## 2022-09-23 MED ORDER — ONDANSETRON HCL 4 MG PO TABS: 4.0000 mg | ORAL_TABLET | Freq: Three times a day (TID) | ORAL | 0 refills | Status: DC | PRN

## 2022-09-23 NOTE — Patient Instructions (Signed)
Anthony Logan, thank you for joining Bennie Pierini, FNP for today's virtual visit.  While this provider is not your primary care provider (PCP), if your PCP is located in our provider database this encounter information will be shared with them immediately following your visit.   A [facility] MyChart account gives you access to today's visit and all your visits, tests, and labs performed at [facility] " click here if you don't have a  MyChart account or go to [email or web address]  Consent: (Patient) Anthony Logan provided verbal consent for this virtual visit at the beginning of the encounter.  Current Medications:  Current Outpatient Medications:    ondansetron (ZOFRAN) 4 MG tablet, Take 1 tablet (4 mg total) by mouth every 8 (eight) hours as needed for nausea or vomiting., Disp: 20 tablet, Rfl: 0   ondansetron (ZOFRAN-ODT) 4 MG disintegrating tablet, Take 1 tablet (4 mg total) by mouth every 8 (eight) hours as needed for nausea or vomiting., Disp: 20 tablet, Rfl: 0   Accu-Chek FastClix Lancets MISC, USE TO TEST GLUCOSE ONCE A DAY AS DIRECTED, Disp: 102 each, Rfl: 2   acetaminophen (TYLENOL) 325 MG tablet, Take 2 tablets (650 mg total) by mouth every 6 (six) hours as needed for mild pain (or Fever >/= 101)., Disp: , Rfl:    allopurinol (ZYLOPRIM) 300 MG tablet, Take 1 tablet (300 mg total) by mouth daily., Disp: 90 tablet, Rfl: 3   Alpha-Lipoic Acid 600 MG CAPS, Take 1 capsule (600 mg total) by mouth daily. For diabetic neuropathy, Disp: 90 capsule, Rfl: 1   aspirin 81 MG chewable tablet, Chew 81 mg by mouth daily., Disp: , Rfl:    atorvastatin (LIPITOR) 20 MG tablet, Take 1 tablet (20 mg total) by mouth daily., Disp: 90 tablet, Rfl: 3   buPROPion (WELLBUTRIN XL) 300 MG 24 hr tablet, Take 1 tablet (300 mg total) by mouth daily., Disp: 90 tablet, Rfl: 3   cholecalciferol (VITAMIN D) 1000 units tablet, Take 1,000 Units by mouth daily., Disp: , Rfl:     colchicine 0.6 MG tablet, Take 1 tablet (0.6 mg total) by mouth daily. (Patient taking differently: Take 0.6 mg by mouth 2 (two) times daily as needed.), Disp: 90 tablet, Rfl: 3   cyclobenzaprine (FLEXERIL) 10 MG tablet, Take 1 tablet (10 mg total) by mouth at bedtime. PUT ONF ILE, Disp: 90 tablet, Rfl: 3   gabapentin (NEURONTIN) 600 MG tablet, Take 1 tablet (600 mg total) by mouth 3 (three) times daily., Disp: 270 tablet, Rfl: 3   hydrALAZINE (APRESOLINE) 50 MG tablet, TAKE 1 TABLET 3 TIMES A DAY, Disp: 270 tablet, Rfl: 3   leptospermum manuka honey (MEDIHONEY) PSTE paste, Apply 1 Application topically daily., Disp: 44 mL, Rfl: 2   lisinopril (ZESTRIL) 20 MG tablet, Take 1 tablet (20 mg total) by mouth daily., Disp: 90 tablet, Rfl: 3   metoprolol tartrate (LOPRESSOR) 25 MG tablet, Take 1 tablet (25 mg total) by mouth 2 (two) times daily., Disp: 180 tablet, Rfl: 3   Multiple Vitamin (MULTIVITAMIN ADULT PO), Take 1 tablet by mouth daily., Disp: , Rfl:    OZEMPIC, 2 MG/DOSE, 8 MG/3ML SOPN, INJECT 2MG  SUBCUTANEOUSLY  ONCE WEEKLY (EVERY 7 DAYS) AS DIRECTED, Disp: 9 mL, Rfl: 0   saccharomyces boulardii (FLORASTOR) 250 MG capsule, Take 1 capsule (250 mg total) by mouth 2 (two) times daily., Disp: 60 capsule, Rfl: 0   sildenafil (VIAGRA) 100 MG tablet, Take 1 tablet (100 mg total) by mouth daily  as needed for erectile dysfunction (PUT ON FILE)., Disp: 6 tablet, Rfl: PRN   Medications ordered in this encounter:  Meds ordered this encounter  Medications   ondansetron (ZOFRAN) 4 MG tablet    Sig: Take 1 tablet (4 mg total) by mouth every 8 (eight) hours as needed for nausea or vomiting.    Dispense:  20 tablet    Refill:  0    Order Specific Question:   Supervising Provider    Answer:   [facility] Care A [[id number]]     *If you need refills on other medications prior to your next appointment, please contact your pharmacy*  Follow-Up: Call back or seek an in-person evaluation if the symptoms worsen or  if the condition fails to improve as anticipated.  [facility] [phone]  Other Instructions First 24 Hours-Clear liquids  popsicles  Jello  gatorade  Sprite Second 24 hours-Add Full liquids ( Liquids you cant see through) Third 24 hours- Bland diet ( foods that are baked or broiled)  *avoiding fried foods and highly spiced foods* During these 3 days  Avoid milk, cheese, ice cream or any other dairy products  Avoid caffeine- REMEMBER Mt. Dew and Mello Yellow contain lots of caffeine You should eat and drink in  Frequent small volumes If no improvement in symptoms or worsen in 2-3 days should RETRUN TO OFFICE or go to ER!      If you have been instructed to have an in-person evaluation today at a local Urgent Care facility, please use the link below. It will take you to a list of all of our available [facility], including address, phone number and hours of operation. Please do not delay care.  [facility]  If you or a family member do not have a primary care provider, use the link below to schedule a visit and establish care. When you choose a [facility] primary care physician or advanced practice provider, you gain a long-term partner in health. Find a Primary Care Provider  Learn more about [facility]'s in-office and virtual care options: [facility] - Get Care Now

## 2022-09-23 NOTE — Progress Notes (Signed)
Virtual Visit Consent   Anthony Logan, you are scheduled for a virtual visit with Anthony Daphine Deutscher, FNP, a [facility] provider, today.     Just as with appointments in the office, your consent must be obtained to participate.  Your consent will be active for this visit and any virtual visit you may have with one of our providers in the next 365 days.     If you have a MyChart account, a copy of this consent can be sent to you electronically.  All virtual visits are billed to your insurance company just like a traditional visit in the office.    As this is a virtual visit, video technology does not allow for your provider to perform a traditional examination.  This may limit your provider's ability to fully assess your condition.  If your provider identifies any concerns that need to be evaluated in person or the need to arrange testing (such as labs, EKG, etc.), we will make arrangements to do so.     Although advances in technology are sophisticated, we cannot ensure that it will always work on either your end or our end.  If the connection with a video visit is poor, the visit may have to be switched to a telephone visit.  With either a video or telephone visit, we are not always able to ensure that we have a secure connection.     I need to obtain your verbal consent now.   Are you willing to proceed with your visit today? YES   CALUB BAILIN has provided verbal consent on 09/23/2022 for a virtual visit (video or telephone).   Anthony Daphine Deutscher, FNP   Date: 09/23/2022 10:30 AM   Virtual Visit via Video Note   I, Anthony Logan, connected with Anthony Logan ([id number], 1960-06-07) on 09/23/22 at 10:05 AM EDT by a video-enabled telemedicine application and verified that I am speaking with the correct person using two identifiers.  Location: Patient: Virtual Visit Location Patient: Home Provider: Virtual Visit Location Provider: Mobile   I discussed the limitations of  evaluation and management by telemedicine and the availability of in person appointments. The patient expressed understanding and agreed to proceed.    History of Present Illness: Anthony Logan is a 62 y.o. who identifies as a male who was assigned male at birth, and is being seen today for vomiting.  HPI: Emesis  This is a new problem. Episode onset: 2 days. The problem occurs 5 to 10 times per day. The problem has been gradually improving. There has been no fever. Associated symptoms include abdominal pain (5/10). Pertinent negatives include no chills, diarrhea or fever. He has tried nothing for the symptoms.    Review of Systems  Constitutional:  Negative for chills and fever.  Gastrointestinal:  Positive for abdominal pain (5/10) and vomiting. Negative for diarrhea.    Problems:  Patient Active Problem List   Diagnosis Date Noted   Hyperglycemia 12/24/2021   Anxiety 02/07/2021   Aortic regurgitation 02/07/2021   Callous ulcer (HCC) 02/07/2021   Chronic kidney disease 02/07/2021   Diabetic ulcer of left great toe (HCC) 02/07/2021   Foot arch pain 02/07/2021   Foot drop, left 02/07/2021   Right knee injury 02/07/2021   Sleep apnea 02/07/2021   Depression 02/07/2021   Erectile dysfunction due to arterial insufficiency 04/19/2020   Cellulitis of right leg 10/07/2019   Uncontrolled type 2 diabetes mellitus with hyperglycemia (HCC) 04/28/2019   Essential hypertension, benign  04/28/2019   Diabetic ulcer of toe of left foot associated with type 2 diabetes mellitus, limited to breakdown of skin (HCC) 03/23/2019   Neuropathy 02/12/2018   Gout 05/05/2017   Renal cell cancer, right (HCC) 07/10/2016   T2DM (type 2 diabetes mellitus) (HCC) 11/22/2015   Abnormal MRI, lumbar spine 06/16/2015   Radiculopathy of lumbar region 06/14/2015   Goiter 06/13/2015   Mixed hyperlipidemia 03/27/2015   Class 2 severe obesity due to excess calories with serious comorbidity and body mass index (BMI) of  36.0 to 36.9 in adult [facility]) 03/27/2015   Mild dilation of ascending aorta (HCC) 06/01/2013   Hypertension associated with diabetes (HCC) 02/15/2013   Precordial pain 02/15/2013   Abnormal ECG 02/15/2013   Hyperlipidemia associated with type 2 diabetes mellitus (HCC) 02/15/2013    Allergies:  Allergies  Allergen Reactions   Diltiazem Swelling    Wt gain,swelling hands,feet,gum bleeding   Clonidine Derivatives Swelling   Procardia [Nifedipine] Swelling    Swelling on feet and legs    Betadine [Povidone-Iodine] Rash   Medications:  Current Outpatient Medications:    Accu-Chek FastClix Lancets MISC, USE TO TEST GLUCOSE ONCE A DAY AS DIRECTED, Disp: 102 each, Rfl: 2   acetaminophen (TYLENOL) 325 MG tablet, Take 2 tablets (650 mg total) by mouth every 6 (six) hours as needed for mild pain (or Fever >/= 101)., Disp: , Rfl:    allopurinol (ZYLOPRIM) 300 MG tablet, Take 1 tablet (300 mg total) by mouth daily., Disp: 90 tablet, Rfl: 3   Alpha-Lipoic Acid 600 MG CAPS, Take 1 capsule (600 mg total) by mouth daily. For diabetic neuropathy, Disp: 90 capsule, Rfl: 1   aspirin 81 MG chewable tablet, Chew 81 mg by mouth daily., Disp: , Rfl:    atorvastatin (LIPITOR) 20 MG tablet, Take 1 tablet (20 mg total) by mouth daily., Disp: 90 tablet, Rfl: 3   buPROPion (WELLBUTRIN XL) 300 MG 24 hr tablet, Take 1 tablet (300 mg total) by mouth daily., Disp: 90 tablet, Rfl: 3   cholecalciferol (VITAMIN D) 1000 units tablet, Take 1,000 Units by mouth daily., Disp: , Rfl:    colchicine 0.6 MG tablet, Take 1 tablet (0.6 mg total) by mouth daily. (Patient taking differently: Take 0.6 mg by mouth 2 (two) times daily as needed.), Disp: 90 tablet, Rfl: 3   cyclobenzaprine (FLEXERIL) 10 MG tablet, Take 1 tablet (10 mg total) by mouth at bedtime. PUT ONF ILE, Disp: 90 tablet, Rfl: 3   gabapentin (NEURONTIN) 600 MG tablet, Take 1 tablet (600 mg total) by mouth 3 (three) times daily., Disp: 270 tablet, Rfl: 3   hydrALAZINE  (APRESOLINE) 50 MG tablet, TAKE 1 TABLET 3 TIMES A DAY, Disp: 270 tablet, Rfl: 3   leptospermum manuka honey (MEDIHONEY) PSTE paste, Apply 1 Application topically daily., Disp: 44 mL, Rfl: 2   lisinopril (ZESTRIL) 20 MG tablet, Take 1 tablet (20 mg total) by mouth daily., Disp: 90 tablet, Rfl: 3   metoprolol tartrate (LOPRESSOR) 25 MG tablet, Take 1 tablet (25 mg total) by mouth 2 (two) times daily., Disp: 180 tablet, Rfl: 3   Multiple Vitamin (MULTIVITAMIN ADULT PO), Take 1 tablet by mouth daily., Disp: , Rfl:    OZEMPIC, 2 MG/DOSE, 8 MG/3ML SOPN, INJECT 2MG  SUBCUTANEOUSLY  ONCE WEEKLY (EVERY 7 DAYS) AS DIRECTED, Disp: 9 mL, Rfl: 0   saccharomyces boulardii (FLORASTOR) 250 MG capsule, Take 1 capsule (250 mg total) by mouth 2 (two) times daily., Disp: 60 capsule, Rfl: 0  sildenafil (VIAGRA) 100 MG tablet, Take 1 tablet (100 mg total) by mouth daily as needed for erectile dysfunction (PUT ON FILE)., Disp: 6 tablet, Rfl: PRN  Observations/Objective: Patient is well-developed, well-nourished in no acute distress.  Resting comfortably  at home.  Head is normocephalic, atraumatic.  No labored breathing.  Speech is clear and coherent with logical content.  Patient is alert and oriented at baseline.    Assessment and Plan:  Anthony Logan in today with chief complaint of Emesis   1. Nausea and vomiting, unspecified vomiting type First 24 Hours-Clear liquids  popsicles  Jello  gatorade  Sprite Second 24 hours-Add Full liquids ( Liquids you cant see through) Third 24 hours- Bland diet ( foods that are baked or broiled)  *avoiding fried foods and highly spiced foods* During these 3 days  Avoid milk, cheese, ice cream or any other dairy products  Avoid caffeine- REMEMBER Mt. Dew and Mello Yellow contain lots of caffeine You should eat and drink in  Frequent small volumes If no improvement in symptoms or worsen in 2-3 days should RETRUN TO OFFICE or go to ER!    - ondansetron (ZOFRAN) 4 MG  tablet; Take 1 tablet (4 mg total) by mouth every 8 (eight) hours as needed for nausea or vomiting.  Dispense: 20 tablet; Refill: 0    Follow Up Instructions: I discussed the assessment and treatment plan with the patient. The patient was provided an opportunity to ask questions and all were answered. The patient agreed with the plan and demonstrated an understanding of the instructions.  A copy of instructions were sent to the patient via MyChart.  The patient was advised to call back or seek an in-person evaluation if the symptoms worsen or if the condition fails to improve as anticipated.  Time:  I spent 5 minutes with the patient via telehealth technology discussing the above problems/concerns.    Anthony Daphine Deutscher, FNP

## 2022-10-11 ENCOUNTER — Other Ambulatory Visit: Payer: Self-pay | Admitting: Family Medicine

## 2022-10-30 ENCOUNTER — Ambulatory Visit (INDEPENDENT_AMBULATORY_CARE_PROVIDER_SITE_OTHER): Payer: 59 | Admitting: "Endocrinology

## 2022-10-30 ENCOUNTER — Encounter: Payer: Self-pay | Admitting: "Endocrinology

## 2022-10-30 VITALS — BP 130/86 | HR 76 | Ht 75.0 in | Wt 291.8 lb

## 2022-10-30 DIAGNOSIS — E782 Mixed hyperlipidemia: Secondary | ICD-10-CM | POA: Diagnosis not present

## 2022-10-30 DIAGNOSIS — I10 Essential (primary) hypertension: Secondary | ICD-10-CM

## 2022-10-30 DIAGNOSIS — Z6836 Body mass index (BMI) 36.0-36.9, adult: Secondary | ICD-10-CM | POA: Diagnosis not present

## 2022-10-30 DIAGNOSIS — E1165 Type 2 diabetes mellitus with hyperglycemia: Secondary | ICD-10-CM

## 2022-10-30 DIAGNOSIS — Z7985 Long-term (current) use of injectable non-insulin antidiabetic drugs: Secondary | ICD-10-CM | POA: Insufficient documentation

## 2022-10-30 LAB — POCT GLYCOSYLATED HEMOGLOBIN (HGB A1C): HbA1c, POC (controlled diabetic range): 5.1 % (ref 0.0–7.0)
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
## 2022-10-30 NOTE — Patient Instructions (Signed)
                                     Advice for Weight Management  -For most of us the best way to lose weight is by diet management. Generally speaking, diet management means consuming less calories intentionally which over time brings about progressive weight loss.  This can be achieved more effectively by avoiding ultra processed carbohydrates, processed meats, unhealthy fats.    It is critically important to know your numbers: how much calorie you are consuming and how much calorie you need. More importantly, our carbohydrates sources should be unprocessed naturally occurring  complex starch food items.  It is always important to balance nutrition also by  appropriate intake of proteins (mainly plant-based), healthy fats/oils, plenty of fruits and vegetables.   -The American College of Lifestyle Medicine (ACL M) recommends nutrition derived mostly from Whole Food, Plant Predominant Sources example an apple instead of applesauce or apple pie. Eat Plenty of vegetables, Mushrooms, fruits, Legumes, Whole Grains, Nuts, seeds in lieu of processed meats, processed snacks/pastries red meat, poultry, eggs.  Use only water or unsweetened tea for hydration.  The College also recommends the need to stay away from risky substances including alcohol, smoking; obtaining 7-9 hours of restorative sleep, at least 150 minutes of moderate intensity exercise weekly, importance of healthy social connections, and being mindful of stress and seek help when it is overwhelming.    -Sticking to a routine mealtime to eat 3 meals a day and avoiding unnecessary snacks is shown to have a big role in weight control. Under normal circumstances, the only time we burn stored energy is when we are hungry, so allow  some hunger to take place- hunger means no food between appropriate meal times, only water.  It is not advisable to starve.   -It is better to avoid simple carbohydrates including:  Cakes, Sweet Desserts, Ice Cream, Soda (diet and regular), Sweet Tea, Candies, Chips, Cookies, Store Bought Juices, Alcohol in Excess of  1-2 drinks a day, Lemonade,  Artificial Sweeteners, Doughnuts, Coffee Creamers, "Sugar-free" Products, etc, etc.  This is not a complete list.....    -Consulting with certified diabetes educators is proven to provide you with the most accurate and current information on diet.  Also, you may be  interested in discussing diet options/exchanges , we can schedule a visit with Anthony Logan, RDN, CDE for individualized nutrition education.  -Exercise: If you are able: 30 -60 minutes a day ,4 days a week, or 150 minutes of moderate intensity exercise weekly.    The longer the better if tolerated.  Combine stretch, strength, and aerobic activities.  If you were told in the past that you have high risk for cardiovascular diseases, or if you are currently symptomatic, you may seek evaluation by your heart doctor prior to initiating moderate to intense exercise programs.                                  Additional Care Considerations for Diabetes/Prediabetes   -Diabetes  is a chronic disease.  The most important care consideration is regular follow-up with your diabetes care provider with the goal being avoiding or delaying its complications and to take advantage of advances in medications and technology.  If appropriate actions are taken early enough, type 2 diabetes can even be   reversed.  Seek information from the right source.  - Whole Food, Plant Predominant Nutrition is highly recommended: Eat Plenty of vegetables, Mushrooms, fruits, Legumes, Whole Grains, Nuts, seeds in lieu of processed meats, processed snacks/pastries red meat, poultry, eggs as recommended by American College of  Lifestyle Medicine (ACLM).  -Type 2 diabetes is known to coexist with other important comorbidities such as high blood pressure and high cholesterol.  It is critical to control not only the  diabetes but also the high blood pressure and high cholesterol to minimize and delay the risk of complications including coronary artery disease, stroke, amputations, blindness, etc.  The good news is that this diet recommendation for type 2 diabetes is also very helpful for managing high cholesterol and high blood blood pressure.  - Studies showed that people with diabetes will benefit from a class of medications known as ACE inhibitors and statins.  Unless there are specific reasons not to be on these medications, the standard of care is to consider getting one from these groups of medications at an optimal doses.  These medications are generally considered safe and proven to help protect the heart and the kidneys.    - People with diabetes are encouraged to initiate and maintain regular follow-up with eye doctors, foot doctors, dentists , and if necessary heart and kidney doctors.     - It is highly recommended that people with diabetes quit smoking or stay away from smoking, and get yearly  flu vaccine and pneumonia vaccine at least every 5 years.  See above for additional recommendations on exercise, sleep, stress management , and healthy social connections.      

## 2022-11-11 ENCOUNTER — Other Ambulatory Visit: Payer: Self-pay | Admitting: "Endocrinology

## 2022-11-28 ENCOUNTER — Ambulatory Visit (HOSPITAL_COMMUNITY)
Admission: RE | Admit: 2022-11-28 | Discharge: 2022-11-28 | Disposition: A | Discharge status: Home / Self Care | Payer: 59 | Source: Ambulatory Visit | Attending: Cardiology | Admitting: Cardiology

## 2022-11-28 DIAGNOSIS — I712 Thoracic aortic aneurysm, without rupture, unspecified: Secondary | ICD-10-CM | POA: Diagnosis not present

## 2022-11-28 DIAGNOSIS — I700 Atherosclerosis of aorta: Secondary | ICD-10-CM | POA: Diagnosis not present

## 2022-11-28 DIAGNOSIS — I771 Stricture of artery: Secondary | ICD-10-CM | POA: Diagnosis not present

## 2022-11-28 DIAGNOSIS — I719 Aortic aneurysm of unspecified site, without rupture: Secondary | ICD-10-CM | POA: Diagnosis not present

## 2022-11-28 DIAGNOSIS — I2510 Atherosclerotic heart disease of native coronary artery without angina pectoris: Secondary | ICD-10-CM | POA: Diagnosis not present

## 2022-11-28 LAB — POCT I-STAT CREATININE: Creatinine, Ser: 1.6 mg/dL — ABNORMAL HIGH (ref 0.61–1.24)

## 2022-11-28 MED ORDER — IOHEXOL 350 MG/ML SOLN
100.0000 mL | Freq: Once | INTRAVENOUS | Status: AC | PRN
  Administered 2022-11-28: 100 mL via INTRAVENOUS

## 2022-12-10 ENCOUNTER — Ambulatory Visit: Payer: 59 | Admitting: Cardiology

## 2022-12-10 NOTE — Progress Notes (Deleted)
    Cardiology Office Note  Date: 12/10/2022   ID: Anthony Logan, DOB [birth year], MRN [id number]  History of Present Illness: Anthony Logan is a 62 y.o. male last seen in June 2023.  Physical Exam: VS:  There were no vitals taken for this visit., BMI There is no height or weight on file to calculate BMI.  Wt Readings from Last 3 Encounters:  10/30/22 291 lb 12.8 oz (132.4 kg)  06/18/22 294 lb (133.4 kg)  04/30/22 289 lb 6.4 oz (131.3 kg)    General: Patient appears comfortable at rest. HEENT: Conjunctiva and lids normal, oropharynx clear with moist mucosa. Neck: Supple, no elevated JVP or carotid bruits, no thyromegaly. Lungs: Clear to auscultation, nonlabored breathing at rest. Cardiac: Regular rate and rhythm, no S3 or significant systolic murmur, no pericardial rub. Abdomen: Soft, nontender, no hepatomegaly, bowel sounds present, no guarding or rebound. Extremities: No pitting edema, distal pulses 2+. Skin: Warm and dry. Musculoskeletal: No kyphosis. Neuropsychiatric: Alert and oriented x3, affect grossly appropriate.  ECG:  An ECG dated 11/13/2021 was personally reviewed today and demonstrated:  Sinus rhythm with prolonged PR interval and inferior Q waves.  Labwork: 01/08/2022: Hemoglobin 12.1; Platelets 490 03/19/2022: TSH 0.617 04/03/2022: ALT 17; AST 19; BUN 15; Potassium 4.3; Sodium 141 11/28/2022: Creatinine, Ser 1.60     Component Value Date/Time   CHOL 87 (L) 03/19/2022 0000   TRIG 205 (H) 03/19/2022 0000   HDL 29 (L) 03/19/2022 0000   CHOLHDL 3.0 03/19/2022 0000   LDLCALC 26 03/19/2022 0000   Other Studies Reviewed Today:  Echocardiogram 11/21/2021:  1. Left ventricular ejection fraction, by estimation, is 60 to 65%. The  left ventricle has normal function. The left ventricle has no regional  wall motion abnormalities. There is moderate asymmetric left ventricular  hypertrophy of the septal segment.  Left ventricular diastolic parameters were normal.   2.  Right ventricular systolic function is normal. The right ventricular  size is normal. There is normal pulmonary artery systolic pressure. The  estimated right ventricular systolic pressure is 20.5 mmHg.   3. The mitral valve is grossly normal. Trivial mitral valve  regurgitation.   4. The aortic valve is tricuspid. Aortic valve regurgitation is mild.  Aortic regurgitation PHT measures 773 msec.   5. Aortic dilatation noted. There is moderate to severe dilatation of the  aortic root, measuring 50 mm.   6. The inferior vena cava is normal in size with greater than 50%  respiratory variability, suggesting right atrial pressure of 3 mmHg.   Chest CTA 11/28/2022: IMPRESSION: Stable aneurysmal dilatation of the aortic root measuring 4.8 cm and the ascending thoracic aorta measuring 4.2 cm. Recommend annual imaging followup by CTA or MRA. This recommendation follows 2010 ACCF/AHA/AATS/ACR/ASA/SCA/SCAI/SIR/STS/SVM Guidelines for the Diagnosis and Management of Patients with Thoracic Aortic Disease. Circulation. 2010; 121: W098-J191. Aortic aneurysm NOS (ICD10-I71.9)   Hepatic steatosis.  Assessment and Plan:  1.  Ascending thoracic aortic aneurysm, stable at 42 mm with aortic root measuring 48 mm by chest CTA recently.  He is asymptomatic.  Annual imaging recommended.  2.  Aortic regurgitation, mild by follow-up echocardiogram in July 2023.  3.  Essential hypertension.  Disposition:  Follow up {follow up:15908}  Signed, Jonelle Sidle, M.D., F.A.C.C. [facility]

## 2022-12-22 ENCOUNTER — Other Ambulatory Visit: Payer: 59

## 2022-12-22 DIAGNOSIS — R112 Nausea with vomiting, unspecified: Secondary | ICD-10-CM

## 2022-12-22 DIAGNOSIS — E782 Mixed hyperlipidemia: Secondary | ICD-10-CM | POA: Diagnosis not present

## 2022-12-22 DIAGNOSIS — E1165 Type 2 diabetes mellitus with hyperglycemia: Secondary | ICD-10-CM | POA: Diagnosis not present

## 2022-12-22 LAB — CBC
Hematocrit: 46.4 % (ref 37.5–51.0)
Hemoglobin: 15.7 g/dL (ref 13.0–17.7)
MCH: 31.7 pg (ref 26.6–33.0)
MCHC: 33.8 g/dL (ref 31.5–35.7)
MCV: 94 fL (ref 79–97)
Platelets: 211 10*3/uL (ref 150–450)
RBC: 4.95 x10E6/uL (ref 4.14–5.80)
RDW: 12.4 % (ref 11.6–15.4)
WBC: 6.8 10*3/uL (ref 3.4–10.8)

## 2022-12-23 LAB — CMP14+EGFR
ALT: 20 IU/L (ref 0–44)
AST: 20 IU/L (ref 0–40)
Albumin: 4 g/dL (ref 3.9–4.9)
Alkaline Phosphatase: 106 IU/L (ref 44–121)
BUN/Creatinine Ratio: 15 (ref 10–24)
BUN: 18 mg/dL (ref 8–27)
Bilirubin Total: 0.6 mg/dL (ref 0.0–1.2)
CO2: 25 mmol/L (ref 20–29)
Calcium: 9.2 mg/dL (ref 8.6–10.2)
Chloride: 103 mmol/L (ref 96–106)
Creatinine, Ser: 1.23 mg/dL (ref 0.76–1.27)
Globulin, Total: 2.8 g/dL (ref 1.5–4.5)
Glucose: 100 mg/dL — ABNORMAL HIGH (ref 70–99)
Potassium: 4.4 mmol/L (ref 3.5–5.2)
Sodium: 143 mmol/L (ref 134–144)
Total Protein: 6.8 g/dL (ref 6.0–8.5)
eGFR: 67 mL/min/1.73

## 2022-12-23 LAB — LIPASE: Lipase: 62 U/L (ref 13–78)

## 2023-01-14 ENCOUNTER — Ambulatory Visit: Payer: 59 | Attending: Cardiology | Admitting: Cardiology

## 2023-01-14 ENCOUNTER — Encounter: Payer: Self-pay | Admitting: Cardiology

## 2023-01-14 VITALS — BP 140/80 | HR 92 | Ht 77.0 in | Wt 298.4 lb

## 2023-01-14 DIAGNOSIS — I10 Essential (primary) hypertension: Secondary | ICD-10-CM

## 2023-01-14 DIAGNOSIS — I351 Nonrheumatic aortic (valve) insufficiency: Secondary | ICD-10-CM

## 2023-01-14 DIAGNOSIS — I7121 Aneurysm of the ascending aorta, without rupture: Secondary | ICD-10-CM | POA: Diagnosis not present

## 2023-01-14 DIAGNOSIS — I712 Thoracic aortic aneurysm, without rupture, unspecified: Secondary | ICD-10-CM

## 2023-01-14 NOTE — Progress Notes (Signed)
Cardiology Office Note  Date: 01/14/2023   ID: SHAM CHRISTLIEB, DOB [birth year], MRN [id number]  History of Present Illness: Anthony Logan is a 62 y.o. male last seen in June 2023.  He is here for a routine visit.  Reports no chest pain or increasing dyspnea with typical activities.  No palpitations or syncope.  I reviewed his medications, he reports compliance with antihypertensive regimen, states that he checks blood pressure at home and systolics are generally in the 130s.  I rechecked it today in the right arm at 140/80.  He had an echocardiogram and chest CTA in July as noted below.  Physical Exam: VS:  BP (!) 140/80   Pulse 92   Ht 6\' 5"  (1.956 m)   Wt 298 lb 6.4 oz (135.4 kg)   SpO2 95%   BMI 35.39 kg/m , BMI Body mass index is 35.39 kg/m.  Wt Readings from Last 3 Encounters:  01/14/23 298 lb 6.4 oz (135.4 kg)  10/30/22 291 lb 12.8 oz (132.4 kg)  06/18/22 294 lb (133.4 kg)    General: Patient appears comfortable at rest. HEENT: Conjunctiva and lids normal. Neck: Supple, no elevated JVP or carotid bruits. Lungs: Clear to auscultation, nonlabored breathing at rest. Cardiac: Regular rate and rhythm, no S3 or significant systolic murmur. Extremities: No pitting edema.  ECG:  An ECG dated 11/13/2021 was personally reviewed today and demonstrated:  Sinus rhythm with prolonged PR interval, inferior Q waves.  Labwork: 03/19/2022: TSH 0.617 12/22/2022: ALT 20; AST 20; BUN 19; Creatinine, Ser 1.26; Hemoglobin 15.7; Platelets 211; Potassium 4.4; Sodium 141     Component Value Date/Time   CHOL 96 (L) 12/22/2022 0849   TRIG 157 (H) 12/22/2022 0849   HDL 35 (L) 12/22/2022 0849   CHOLHDL 2.7 12/22/2022 0849   LDLCALC 34 12/22/2022 0849   Other Studies Reviewed Today:  Echocardiogram 11/21/2021:  1. Left ventricular ejection fraction, by estimation, is 60 to 65%. The  left ventricle has normal function. The left ventricle has no regional  wall motion abnormalities. There is  moderate asymmetric left ventricular  hypertrophy of the septal segment.  Left ventricular diastolic parameters were normal.   2. Right ventricular systolic function is normal. The right ventricular  size is normal. There is normal pulmonary artery systolic pressure. The  estimated right ventricular systolic pressure is 20.5 mmHg.   3. The mitral valve is grossly normal. Trivial mitral valve  regurgitation.   4. The aortic valve is tricuspid. Aortic valve regurgitation is mild.  Aortic regurgitation PHT measures 773 msec.   5. Aortic dilatation noted. There is moderate to severe dilatation of the  aortic root, measuring 50 mm.   6. The inferior vena cava is normal in size with greater than 50%  respiratory variability, suggesting right atrial pressure of 3 mmHg.   Chest CTA 11/28/2022: IMPRESSION: Stable aneurysmal dilatation of the aortic root measuring 4.8 cm and the ascending thoracic aorta measuring 4.2 cm. Recommend annual imaging followup by CTA or MRA. This recommendation follows 2010 ACCF/AHA/AATS/ACR/ASA/SCA/SCAI/SIR/STS/SVM Guidelines for the Diagnosis and Management of Patients with Thoracic Aortic Disease. Circulation. 2010; 121: Q657-Q469. Aortic aneurysm NOS (ICD10-I71.9)   Hepatic steatosis.  Assessment and Plan:  1.  Asymptomatic aortic root and ascending aortic dilatation, 4.8 cm and 4.2 cm respectively by recent chest CTA in July.  Annual imaging recommended.  Follow-up arranged.  2.  Tricuspid aortic valve with mild aortic regurgitation by follow-up echocardiogram in July of last year.  3.  Essential hypertension.  Continues on lisinopril, hydralazine, and Lopressor.  Continue to track blood pressure as an outpatient in case further adjustments are necessary.  Keep follow-up with PCP.  Disposition:  Follow up  1 year.  Signed, Jonelle Sidle, M.D., F.A.C.C.  [facility]

## 2023-01-14 NOTE — Patient Instructions (Signed)
Medication Instructions:   Your physician recommends that you continue on your current medications as directed. Please refer to the Current Medication list given to you today.   Labwork: BMET within 30 days of Chest CT  Testing/Procedures:  Chest CT/ Aorta in 1 year -(August 2025)   Follow-Up: 1 year- (after Chest CT)  with Dr.McDowell  Any Other Special Instructions Will Be Listed Below (If Applicable).  If you need a refill on your cardiac medications before your next appointment, please call your pharmacy.

## 2023-01-16 ENCOUNTER — Ambulatory Visit: Payer: 59 | Admitting: Cardiology

## 2023-02-11 ENCOUNTER — Ambulatory Visit: Payer: 59 | Admitting: Cardiology

## 2023-02-12 ENCOUNTER — Other Ambulatory Visit: Payer: Self-pay | Admitting: "Endocrinology

## 2023-02-26 ENCOUNTER — Other Ambulatory Visit: Payer: Self-pay | Admitting: Family Medicine

## 2023-02-26 DIAGNOSIS — F32A Depression, unspecified: Secondary | ICD-10-CM

## 2023-02-26 MED ORDER — BUPROPION HCL ER (XL) 300 MG PO TB24
300.0000 mg | ORAL_TABLET | Freq: Every day | ORAL | 0 refills | Status: DC
Start: 2023-02-26 — End: 2023-05-25

## 2023-02-26 NOTE — Telephone Encounter (Signed)
[facility] NTBS Last OV 03/19/22 NO RF sent to mail order phamacy

## 2023-02-26 NOTE — Addendum Note (Signed)
Addended by: Julious Payer D on: 02/26/2023 11:10 AM   Modules accepted: Orders

## 2023-02-26 NOTE — Telephone Encounter (Signed)
I called pt & made him an appt on 06-02-2022 w/[facility]. Also, put him on the wait list, if anyone cancels to get him sooner.

## 2023-03-17 ENCOUNTER — Ambulatory Visit: Payer: 59 | Admitting: Cardiology

## 2023-03-27 ENCOUNTER — Other Ambulatory Visit: Payer: Self-pay | Admitting: Family Medicine

## 2023-03-27 DIAGNOSIS — I152 Hypertension secondary to endocrine disorders: Secondary | ICD-10-CM

## 2023-04-08 ENCOUNTER — Other Ambulatory Visit: Payer: Self-pay | Admitting: Family Medicine

## 2023-04-08 ENCOUNTER — Telehealth: Payer: Self-pay

## 2023-04-08 DIAGNOSIS — E1142 Type 2 diabetes mellitus with diabetic polyneuropathy: Secondary | ICD-10-CM

## 2023-04-08 NOTE — Patient Outreach (Signed)
Attempted to contact patient regarding Urine micro, DM eye. Left voicemail for patient to return my call at [phone].  Nicholes Rough, CMA Care Guide [employer]

## 2023-04-13 ENCOUNTER — Ambulatory Visit: Payer: 59 | Admitting: Family Medicine

## 2023-04-13 ENCOUNTER — Encounter: Payer: Self-pay | Admitting: Family Medicine

## 2023-04-13 VITALS — BP 173/103 | HR 92 | Temp 98.6°F | Ht 77.0 in | Wt 290.0 lb

## 2023-04-13 DIAGNOSIS — E1169 Type 2 diabetes mellitus with other specified complication: Secondary | ICD-10-CM

## 2023-04-13 DIAGNOSIS — G8929 Other chronic pain: Secondary | ICD-10-CM | POA: Diagnosis not present

## 2023-04-13 DIAGNOSIS — L97529 Non-pressure chronic ulcer of other part of left foot with unspecified severity: Secondary | ICD-10-CM | POA: Diagnosis not present

## 2023-04-13 DIAGNOSIS — Z7985 Long-term (current) use of injectable non-insulin antidiabetic drugs: Secondary | ICD-10-CM

## 2023-04-13 DIAGNOSIS — M25561 Pain in right knee: Secondary | ICD-10-CM

## 2023-04-13 DIAGNOSIS — Z23 Encounter for immunization: Secondary | ICD-10-CM

## 2023-04-13 DIAGNOSIS — E1142 Type 2 diabetes mellitus with diabetic polyneuropathy: Secondary | ICD-10-CM | POA: Diagnosis not present

## 2023-04-13 DIAGNOSIS — I152 Hypertension secondary to endocrine disorders: Secondary | ICD-10-CM | POA: Diagnosis not present

## 2023-04-13 DIAGNOSIS — E1159 Type 2 diabetes mellitus with other circulatory complications: Secondary | ICD-10-CM

## 2023-04-13 DIAGNOSIS — E11621 Type 2 diabetes mellitus with foot ulcer: Secondary | ICD-10-CM | POA: Diagnosis not present
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
## 2023-04-27 ENCOUNTER — Ambulatory Visit: Payer: 59

## 2023-04-27 ENCOUNTER — Other Ambulatory Visit: Payer: Self-pay | Admitting: Family Medicine

## 2023-04-27 ENCOUNTER — Other Ambulatory Visit: Payer: Self-pay | Admitting: "Endocrinology

## 2023-04-27 VITALS — BP 162/92 | HR 84

## 2023-04-27 DIAGNOSIS — Z0130 Encounter for examination of blood pressure without abnormal findings: Secondary | ICD-10-CM

## 2023-04-27 DIAGNOSIS — E1159 Type 2 diabetes mellitus with other circulatory complications: Secondary | ICD-10-CM

## 2023-04-27 DIAGNOSIS — Z8739 Personal history of other diseases of the musculoskeletal system and connective tissue: Secondary | ICD-10-CM

## 2023-04-27 MED ORDER — LISINOPRIL 40 MG PO TABS: 40.0000 mg | ORAL_TABLET | Freq: Every day | ORAL | 3 refills | Status: DC

## 2023-04-27 NOTE — Progress Notes (Signed)
Patient here today for blood pressure check Blood pressure 162/92 Pulse 84  Patient reports he has been checking at home and the readings he is getting there are similar to this.

## 2023-04-30 DIAGNOSIS — M25561 Pain in right knee: Secondary | ICD-10-CM | POA: Diagnosis not present

## 2023-05-05 ENCOUNTER — Ambulatory Visit (INDEPENDENT_AMBULATORY_CARE_PROVIDER_SITE_OTHER): Payer: 59 | Admitting: "Endocrinology

## 2023-05-05 ENCOUNTER — Encounter: Payer: Self-pay | Admitting: "Endocrinology

## 2023-05-05 VITALS — BP 146/92 | HR 64 | Ht 77.0 in | Wt 299.6 lb

## 2023-05-05 DIAGNOSIS — E782 Mixed hyperlipidemia: Secondary | ICD-10-CM | POA: Diagnosis not present

## 2023-05-05 DIAGNOSIS — I10 Essential (primary) hypertension: Secondary | ICD-10-CM

## 2023-05-05 DIAGNOSIS — E1165 Type 2 diabetes mellitus with hyperglycemia: Secondary | ICD-10-CM | POA: Diagnosis not present

## 2023-05-05 DIAGNOSIS — E66812 Obesity, class 2: Secondary | ICD-10-CM

## 2023-05-05 DIAGNOSIS — Z6836 Body mass index (BMI) 36.0-36.9, adult: Secondary | ICD-10-CM

## 2023-05-05 DIAGNOSIS — Z7985 Long-term (current) use of injectable non-insulin antidiabetic drugs: Secondary | ICD-10-CM

## 2023-05-05 LAB — POCT GLYCOSYLATED HEMOGLOBIN (HGB A1C): HbA1c, POC (controlled diabetic range): 5.3 % (ref 0.0–7.0)

## 2023-05-05 NOTE — Addendum Note (Signed)
Addended by: Derrell Lolling on: 05/05/2023 11:44 AM   Modules accepted: Orders
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
## 2023-05-05 NOTE — Patient Instructions (Signed)
                                     Advice for Weight Management  -For most of us the best way to lose weight is by diet management. Generally speaking, diet management means consuming less calories intentionally which over time brings about progressive weight loss.  This can be achieved more effectively by avoiding ultra processed carbohydrates, processed meats, unhealthy fats.    It is critically important to know your numbers: how much calorie you are consuming and how much calorie you need. More importantly, our carbohydrates sources should be unprocessed naturally occurring  complex starch food items.  It is always important to balance nutrition also by  appropriate intake of proteins (mainly plant-based), healthy fats/oils, plenty of fruits and vegetables.   -The American College of Lifestyle Medicine (ACL M) recommends nutrition derived mostly from Whole Food, Plant Predominant Sources example an apple instead of applesauce or apple pie. Eat Plenty of vegetables, Mushrooms, fruits, Legumes, Whole Grains, Nuts, seeds in lieu of processed meats, processed snacks/pastries red meat, poultry, eggs.  Use only water or unsweetened tea for hydration.  The College also recommends the need to stay away from risky substances including alcohol, smoking; obtaining 7-9 hours of restorative sleep, at least 150 minutes of moderate intensity exercise weekly, importance of healthy social connections, and being mindful of stress and seek help when it is overwhelming.    -Sticking to a routine mealtime to eat 3 meals a day and avoiding unnecessary snacks is shown to have a big role in weight control. Under normal circumstances, the only time we burn stored energy is when we are hungry, so allow  some hunger to take place- hunger means no food between appropriate meal times, only water.  It is not advisable to starve.   -It is better to avoid simple carbohydrates including:  Cakes, Sweet Desserts, Ice Cream, Soda (diet and regular), Sweet Tea, Candies, Chips, Cookies, Store Bought Juices, Alcohol in Excess of  1-2 drinks a day, Lemonade,  Artificial Sweeteners, Doughnuts, Coffee Creamers, "Sugar-free" Products, etc, etc.  This is not a complete list.....    -Consulting with certified diabetes educators is proven to provide you with the most accurate and current information on diet.  Also, you may be  interested in discussing diet options/exchanges , we can schedule a visit with Anthony Logan, RDN, CDE for individualized nutrition education.  -Exercise: If you are able: 30 -60 minutes a day ,4 days a week, or 150 minutes of moderate intensity exercise weekly.    The longer the better if tolerated.  Combine stretch, strength, and aerobic activities.  If you were told in the past that you have high risk for cardiovascular diseases, or if you are currently symptomatic, you may seek evaluation by your heart doctor prior to initiating moderate to intense exercise programs.                                  Additional Care Considerations for Diabetes/Prediabetes   -Diabetes  is a chronic disease.  The most important care consideration is regular follow-up with your diabetes care provider with the goal being avoiding or delaying its complications and to take advantage of advances in medications and technology.  If appropriate actions are taken early enough, type 2 diabetes can even be   reversed.  Seek information from the right source.  - Whole Food, Plant Predominant Nutrition is highly recommended: Eat Plenty of vegetables, Mushrooms, fruits, Legumes, Whole Grains, Nuts, seeds in lieu of processed meats, processed snacks/pastries red meat, poultry, eggs as recommended by American College of  Lifestyle Medicine (ACLM).  -Type 2 diabetes is known to coexist with other important comorbidities such as high blood pressure and high cholesterol.  It is critical to control not only the  diabetes but also the high blood pressure and high cholesterol to minimize and delay the risk of complications including coronary artery disease, stroke, amputations, blindness, etc.  The good news is that this diet recommendation for type 2 diabetes is also very helpful for managing high cholesterol and high blood blood pressure.  - Studies showed that people with diabetes will benefit from a class of medications known as ACE inhibitors and statins.  Unless there are specific reasons not to be on these medications, the standard of care is to consider getting one from these groups of medications at an optimal doses.  These medications are generally considered safe and proven to help protect the heart and the kidneys.    - People with diabetes are encouraged to initiate and maintain regular follow-up with eye doctors, foot doctors, dentists , and if necessary heart and kidney doctors.     - It is highly recommended that people with diabetes quit smoking or stay away from smoking, and get yearly  flu vaccine and pneumonia vaccine at least every 5 years.  See above for additional recommendations on exercise, sleep, stress management , and healthy social connections.      

## 2023-05-07 NOTE — Progress Notes (Signed)
Have tried to call patient multiple times and each time mailbox has been full and cannot get in touch with him.

## 2023-05-15 DIAGNOSIS — Z7984 Long term (current) use of oral hypoglycemic drugs: Secondary | ICD-10-CM | POA: Diagnosis not present

## 2023-05-15 DIAGNOSIS — H2513 Age-related nuclear cataract, bilateral: Secondary | ICD-10-CM | POA: Diagnosis not present

## 2023-05-15 DIAGNOSIS — E119 Type 2 diabetes mellitus without complications: Secondary | ICD-10-CM | POA: Diagnosis not present

## 2023-05-25 ENCOUNTER — Other Ambulatory Visit: Payer: Self-pay | Admitting: Family Medicine

## 2023-05-25 DIAGNOSIS — E1169 Type 2 diabetes mellitus with other specified complication: Secondary | ICD-10-CM

## 2023-05-25 DIAGNOSIS — F32A Depression, unspecified: Secondary | ICD-10-CM

## 2023-06-03 ENCOUNTER — Encounter: Payer: Self-pay | Admitting: Family Medicine

## 2023-06-03 ENCOUNTER — Ambulatory Visit: Payer: 59 | Admitting: Family Medicine

## 2023-06-03 VITALS — BP 152/93 | HR 91 | Temp 98.9°F | Ht 77.0 in | Wt 298.0 lb

## 2023-06-03 DIAGNOSIS — Z8739 Personal history of other diseases of the musculoskeletal system and connective tissue: Secondary | ICD-10-CM | POA: Diagnosis not present

## 2023-06-03 DIAGNOSIS — I152 Hypertension secondary to endocrine disorders: Secondary | ICD-10-CM

## 2023-06-03 DIAGNOSIS — Z7985 Long-term (current) use of injectable non-insulin antidiabetic drugs: Secondary | ICD-10-CM | POA: Diagnosis not present

## 2023-06-03 DIAGNOSIS — E1169 Type 2 diabetes mellitus with other specified complication: Secondary | ICD-10-CM | POA: Diagnosis not present

## 2023-06-03 DIAGNOSIS — E785 Hyperlipidemia, unspecified: Secondary | ICD-10-CM | POA: Diagnosis not present

## 2023-06-03 DIAGNOSIS — E1159 Type 2 diabetes mellitus with other circulatory complications: Secondary | ICD-10-CM

## 2023-06-03 DIAGNOSIS — E1142 Type 2 diabetes mellitus with diabetic polyneuropathy: Secondary | ICD-10-CM

## 2023-06-03 DIAGNOSIS — F32A Depression, unspecified: Secondary | ICD-10-CM | POA: Diagnosis not present

## 2023-06-03 DIAGNOSIS — M5416 Radiculopathy, lumbar region: Secondary | ICD-10-CM | POA: Diagnosis not present

## 2023-06-03 MED ORDER — ALLOPURINOL 300 MG PO TABS: 300.0000 mg | ORAL_TABLET | Freq: Every day | ORAL | 3 refills | Status: DC

## 2023-06-03 MED ORDER — BUPROPION HCL ER (XL) 300 MG PO TB24: 300.0000 mg | ORAL_TABLET | Freq: Every day | ORAL | 3 refills | Status: DC

## 2023-06-03 MED ORDER — METOPROLOL TARTRATE 25 MG PO TABS: 25.0000 mg | ORAL_TABLET | Freq: Two times a day (BID) | ORAL | 3 refills | Status: DC

## 2023-06-03 MED ORDER — ATORVASTATIN CALCIUM 20 MG PO TABS: 20.0000 mg | ORAL_TABLET | Freq: Every day | ORAL | 3 refills | Status: DC

## 2023-06-03 MED ORDER — LISINOPRIL 40 MG PO TABS: 40.0000 mg | ORAL_TABLET | Freq: Every day | ORAL | 3 refills | Status: DC

## 2023-06-03 MED ORDER — HYDRALAZINE HCL 100 MG PO TABS: 100.0000 mg | ORAL_TABLET | Freq: Three times a day (TID) | ORAL | 3 refills | Status: DC

## 2023-06-03 MED ORDER — GABAPENTIN 600 MG PO TABS: 600.0000 mg | ORAL_TABLET | Freq: Three times a day (TID) | ORAL | 3 refills | Status: DC

## 2023-06-03 MED ORDER — CYCLOBENZAPRINE HCL 10 MG PO TABS: 10.0000 mg | ORAL_TABLET | Freq: Three times a day (TID) | ORAL | 1 refills | Status: DC | PRN
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
## 2023-06-04 LAB — MICROALBUMIN / CREATININE URINE RATIO
Creatinine, Urine: 129.3 mg/dL
Microalb/Creat Ratio: 1651 mg/g{creat} — ABNORMAL HIGH (ref 0–29)
Microalbumin, Urine: 2134.5 ug/mL

## 2023-06-12 ENCOUNTER — Other Ambulatory Visit: Payer: Self-pay | Admitting: Family Medicine

## 2023-06-12 DIAGNOSIS — I152 Hypertension secondary to endocrine disorders: Secondary | ICD-10-CM

## 2023-06-17 ENCOUNTER — Other Ambulatory Visit: Payer: Self-pay | Admitting: Family Medicine

## 2023-06-17 DIAGNOSIS — E785 Hyperlipidemia, unspecified: Secondary | ICD-10-CM

## 2023-06-18 ENCOUNTER — Other Ambulatory Visit: Payer: Self-pay | Admitting: Family Medicine

## 2023-06-18 DIAGNOSIS — M5416 Radiculopathy, lumbar region: Secondary | ICD-10-CM

## 2023-06-18 DIAGNOSIS — F32A Depression, unspecified: Secondary | ICD-10-CM

## 2023-07-02 ENCOUNTER — Ambulatory Visit: Payer: 59

## 2023-07-02 VITALS — Ht 77.0 in | Wt 298.0 lb

## 2023-07-02 DIAGNOSIS — Z0000 Encounter for general adult medical examination without abnormal findings: Secondary | ICD-10-CM | POA: Diagnosis not present

## 2023-07-02 NOTE — Patient Instructions (Signed)
Mr. Anthony Logan , Thank you for taking time to come for your Medicare Wellness Visit. I appreciate your ongoing commitment to your health goals. Please review the following plan we discussed and let me know if I can assist you in the future.   Referrals/Orders/Follow-Ups/Clinician Recommendations: Aim for 30 minutes of exercise or brisk walking, 6-8 glasses of water, and 5 servings of fruits and vegetables each day.  This is a list of the screening recommended for you and due dates:  Health Maintenance  Topic Date Due   COVID-19 Vaccine (5 - 2024-25 season) 01/18/2023   Zoster (Shingles) Vaccine (1 of 2) 09/01/2023*   Hepatitis C Screening  04/12/2024*   Hemoglobin A1C  11/03/2023   Yearly kidney function blood test for diabetes  12/22/2023   Complete foot exam   04/12/2024   Eye exam for diabetics  05/14/2024   Yearly kidney health urinalysis for diabetes  06/02/2024   Medicare Annual Wellness Visit  07/01/2024   Cologuard (Stool DNA test)  08/12/2024   Pneumococcal Vaccination (3 of 3 - PPSV23 or PCV20) 03/19/2026   DTaP/Tdap/Td vaccine (4 - Td or Tdap) 12/26/2031   Flu Shot  Completed   HIV Screening  Completed   HPV Vaccine  Aged Out  *Topic was postponed. The date shown is not the original due date.    Advanced directives: (ACP Link)Information on Advanced Care Planning can be found at [facility] Advance Health Care Directives Advance Health Care Directives ([email or web address]   Next Medicare Annual Wellness Visit scheduled for next year: Yes
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
## 2023-07-30 ENCOUNTER — Other Ambulatory Visit: Payer: Self-pay | Admitting: "Endocrinology

## 2023-08-28 DIAGNOSIS — M1711 Unilateral primary osteoarthritis, right knee: Secondary | ICD-10-CM | POA: Diagnosis not present

## 2023-08-28 DIAGNOSIS — M25561 Pain in right knee: Secondary | ICD-10-CM | POA: Diagnosis not present

## 2023-09-02 ENCOUNTER — Other Ambulatory Visit: Payer: Self-pay | Admitting: Family Medicine

## 2023-09-02 DIAGNOSIS — E1159 Type 2 diabetes mellitus with other circulatory complications: Secondary | ICD-10-CM

## 2023-10-15 ENCOUNTER — Other Ambulatory Visit: Payer: Self-pay | Admitting: "Endocrinology

## 2023-10-27 DIAGNOSIS — E1165 Type 2 diabetes mellitus with hyperglycemia: Secondary | ICD-10-CM | POA: Diagnosis not present

## 2023-10-27 DIAGNOSIS — E782 Mixed hyperlipidemia: Secondary | ICD-10-CM | POA: Diagnosis not present

## 2023-10-28 LAB — LIPID PANEL
Chol/HDL Ratio: 2.9 ratio (ref 0.0–5.0)
Cholesterol, Total: 109 mg/dL (ref 100–199)
HDL: 37 mg/dL — ABNORMAL LOW (ref >39)
LDL Chol Calc (NIH): 43 mg/dL (ref 0–99)
Triglycerides: 173 mg/dL — ABNORMAL HIGH (ref 0–149)
VLDL Cholesterol Cal: 29 mg/dL (ref 5–40)

## 2023-10-28 LAB — COMPREHENSIVE METABOLIC PANEL WITH GFR
ALT: 24 IU/L (ref 0–44)
AST: 22 IU/L (ref 0–40)
Albumin: 4.3 g/dL (ref 3.9–4.9)
Alkaline Phosphatase: 88 IU/L (ref 44–121)
BUN/Creatinine Ratio: 16 (ref 10–24)
BUN: 21 mg/dL (ref 8–27)
Bilirubin Total: 0.9 mg/dL (ref 0.0–1.2)
CO2: 26 mmol/L (ref 20–29)
Calcium: 9.8 mg/dL (ref 8.6–10.2)
Chloride: 102 mmol/L (ref 96–106)
Creatinine, Ser: 1.35 mg/dL — ABNORMAL HIGH (ref 0.76–1.27)
Globulin, Total: 2.6 g/dL (ref 1.5–4.5)
Glucose: 118 mg/dL — ABNORMAL HIGH (ref 70–99)
Potassium: 4.8 mmol/L (ref 3.5–5.2)
Sodium: 143 mmol/L (ref 134–144)
Total Protein: 6.9 g/dL (ref 6.0–8.5)
eGFR: 59 mL/min/{1.73_m2} — ABNORMAL LOW (ref >59)

## 2023-10-28 LAB — VITAMIN D 25 HYDROXY (VIT D DEFICIENCY, FRACTURES): Vit D, 25-Hydroxy: 77.5 ng/mL (ref 30.0–100.0)

## 2023-11-03 ENCOUNTER — Ambulatory Visit (INDEPENDENT_AMBULATORY_CARE_PROVIDER_SITE_OTHER): Payer: 59 | Admitting: "Endocrinology

## 2023-11-03 ENCOUNTER — Encounter: Payer: Self-pay | Admitting: "Endocrinology

## 2023-11-03 VITALS — BP 144/84 | HR 84 | Ht 77.0 in | Wt 301.0 lb

## 2023-11-03 DIAGNOSIS — E66812 Obesity, class 2: Secondary | ICD-10-CM | POA: Diagnosis not present

## 2023-11-03 DIAGNOSIS — E782 Mixed hyperlipidemia: Secondary | ICD-10-CM | POA: Diagnosis not present

## 2023-11-03 DIAGNOSIS — E1165 Type 2 diabetes mellitus with hyperglycemia: Secondary | ICD-10-CM | POA: Diagnosis not present

## 2023-11-03 DIAGNOSIS — Z6836 Body mass index (BMI) 36.0-36.9, adult: Secondary | ICD-10-CM | POA: Diagnosis not present

## 2023-11-03 DIAGNOSIS — Z7985 Long-term (current) use of injectable non-insulin antidiabetic drugs: Secondary | ICD-10-CM | POA: Diagnosis not present

## 2023-11-03 DIAGNOSIS — I10 Essential (primary) hypertension: Secondary | ICD-10-CM

## 2023-11-03 LAB — POCT GLYCOSYLATED HEMOGLOBIN (HGB A1C): HbA1c, POC (controlled diabetic range): 5.4 % (ref 0.0–7.0)
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
## 2023-11-08 LAB — ALDOSTERONE + RENIN ACTIVITY W/ RATIO
Aldos/Renin Ratio: 30.2 — ABNORMAL HIGH (ref 0.0–30.0)
Aldosterone: 13.3 ng/dL (ref 0.0–30.0)
Renin Activity, Plasma: 0.441 ng/mL/h (ref 0.167–5.380)

## 2023-11-12 ENCOUNTER — Telehealth: Payer: Self-pay

## 2023-11-12 ENCOUNTER — Other Ambulatory Visit: Payer: Self-pay | Admitting: "Endocrinology

## 2023-11-12 MED ORDER — KERENDIA 10 MG PO TABS: 10.0000 mg | ORAL_TABLET | Freq: Every day | ORAL | 1 refills | Status: DC

## 2023-11-12 NOTE — Telephone Encounter (Signed)
 Pt called stating he was returning a call to Dr.Nida. Contact # [phone]

## 2023-11-13 ENCOUNTER — Other Ambulatory Visit (HOSPITAL_COMMUNITY): Payer: Self-pay

## 2023-11-13 ENCOUNTER — Telehealth: Payer: Self-pay | Admitting: Pharmacy Technician

## 2023-11-13 NOTE — Telephone Encounter (Signed)
 Pharmacy Patient Advocate Encounter   Received notification from CoverMyMeds that prior authorization for Kerendia  10MG  tablets is required/requested.   Insurance verification completed.   The patient is insured through [facility] .   Per test claim: PA required and submitted KEY/EOC/Request #: [id number] from 11/13/2023 to 11/12/2024. Ran test claim, Copay is $0.00. This test claim was processed through [facility]- copay amounts may vary at other pharmacies due to pharmacy/plan contracts, or as the patient moves through the different stages of their insurance plan.

## 2023-12-04 ENCOUNTER — Encounter: Payer: Self-pay | Admitting: Advanced Practice Midwife

## 2023-12-06 ENCOUNTER — Encounter

## 2023-12-06 DIAGNOSIS — Z6835 Body mass index (BMI) 35.0-35.9, adult: Secondary | ICD-10-CM | POA: Diagnosis not present

## 2023-12-06 DIAGNOSIS — I10 Essential (primary) hypertension: Secondary | ICD-10-CM | POA: Diagnosis not present

## 2023-12-06 DIAGNOSIS — U071 COVID-19: Secondary | ICD-10-CM | POA: Diagnosis not present

## 2023-12-06 DIAGNOSIS — E669 Obesity, unspecified: Secondary | ICD-10-CM | POA: Diagnosis not present

## 2023-12-06 DIAGNOSIS — R0981 Nasal congestion: Secondary | ICD-10-CM | POA: Diagnosis not present

## 2023-12-08 ENCOUNTER — Ambulatory Visit: Admitting: Family Medicine

## 2023-12-23 ENCOUNTER — Other Ambulatory Visit: Payer: Self-pay | Admitting: "Endocrinology

## 2024-02-01 ENCOUNTER — Encounter: Payer: 59 | Admitting: Family Medicine

## 2024-02-01 ENCOUNTER — Encounter: Admitting: Family Medicine

## 2024-02-06 ENCOUNTER — Other Ambulatory Visit: Payer: Self-pay | Admitting: Family Medicine

## 2024-02-06 DIAGNOSIS — F32A Depression, unspecified: Secondary | ICD-10-CM

## 2024-03-07 ENCOUNTER — Ambulatory Visit (INDEPENDENT_AMBULATORY_CARE_PROVIDER_SITE_OTHER): Admitting: Family Medicine

## 2024-03-07 ENCOUNTER — Encounter: Payer: Self-pay | Admitting: Family Medicine

## 2024-03-07 VITALS — BP 131/80 | HR 88 | Temp 99.2°F | Ht 77.0 in | Wt 300.2 lb

## 2024-03-07 DIAGNOSIS — Z0001 Encounter for general adult medical examination with abnormal findings: Secondary | ICD-10-CM

## 2024-03-07 DIAGNOSIS — E1169 Type 2 diabetes mellitus with other specified complication: Secondary | ICD-10-CM | POA: Diagnosis not present

## 2024-03-07 DIAGNOSIS — Z8739 Personal history of other diseases of the musculoskeletal system and connective tissue: Secondary | ICD-10-CM

## 2024-03-07 DIAGNOSIS — Z6835 Body mass index (BMI) 35.0-35.9, adult: Secondary | ICD-10-CM

## 2024-03-07 DIAGNOSIS — L97522 Non-pressure chronic ulcer of other part of left foot with fat layer exposed: Secondary | ICD-10-CM | POA: Diagnosis not present

## 2024-03-07 DIAGNOSIS — E1159 Type 2 diabetes mellitus with other circulatory complications: Secondary | ICD-10-CM

## 2024-03-07 DIAGNOSIS — Z0000 Encounter for general adult medical examination without abnormal findings: Secondary | ICD-10-CM

## 2024-03-07 DIAGNOSIS — Z125 Encounter for screening for malignant neoplasm of prostate: Secondary | ICD-10-CM | POA: Diagnosis not present

## 2024-03-07 DIAGNOSIS — Z7985 Long-term (current) use of injectable non-insulin antidiabetic drugs: Secondary | ICD-10-CM

## 2024-03-07 DIAGNOSIS — E66812 Obesity, class 2: Secondary | ICD-10-CM | POA: Diagnosis not present

## 2024-03-07 DIAGNOSIS — E11621 Type 2 diabetes mellitus with foot ulcer: Secondary | ICD-10-CM | POA: Diagnosis not present

## 2024-03-07 DIAGNOSIS — F320 Major depressive disorder, single episode, mild: Secondary | ICD-10-CM

## 2024-03-07 DIAGNOSIS — R251 Tremor, unspecified: Secondary | ICD-10-CM | POA: Diagnosis not present

## 2024-03-07 DIAGNOSIS — Z23 Encounter for immunization: Secondary | ICD-10-CM | POA: Diagnosis not present

## 2024-03-07 DIAGNOSIS — M5416 Radiculopathy, lumbar region: Secondary | ICD-10-CM

## 2024-03-07 DIAGNOSIS — Z85528 Personal history of other malignant neoplasm of kidney: Secondary | ICD-10-CM | POA: Diagnosis not present

## 2024-03-07 DIAGNOSIS — E785 Hyperlipidemia, unspecified: Secondary | ICD-10-CM | POA: Diagnosis not present

## 2024-03-07 LAB — BAYER DCA HB A1C WAIVED: HB A1C (BAYER DCA - WAIVED): 5.3 % (ref 4.8–5.6)

## 2024-03-07 MED ORDER — LISINOPRIL 40 MG PO TABS: 40.0000 mg | ORAL_TABLET | Freq: Every day | ORAL | 3 refills | Status: AC

## 2024-03-07 MED ORDER — BUPROPION HCL ER (XL) 300 MG PO TB24
300.0000 mg | ORAL_TABLET | Freq: Every day | ORAL | 3 refills | Status: AC
Start: 2024-03-07 — End: ?

## 2024-03-07 MED ORDER — GABAPENTIN 600 MG PO TABS: 600.0000 mg | ORAL_TABLET | Freq: Three times a day (TID) | ORAL | 3 refills | Status: AC

## 2024-03-07 MED ORDER — METOPROLOL TARTRATE 25 MG PO TABS
25.0000 mg | ORAL_TABLET | Freq: Two times a day (BID) | ORAL | 3 refills | Status: AC
Start: 2024-03-07 — End: ?

## 2024-03-07 MED ORDER — ALLOPURINOL 300 MG PO TABS: 300.0000 mg | ORAL_TABLET | Freq: Every day | ORAL | 3 refills | Status: AC

## 2024-03-07 MED ORDER — HYDRALAZINE HCL 100 MG PO TABS: 100.0000 mg | ORAL_TABLET | Freq: Three times a day (TID) | ORAL | 3 refills | Status: AC

## 2024-03-07 MED ORDER — ATORVASTATIN CALCIUM 20 MG PO TABS: 20.0000 mg | ORAL_TABLET | Freq: Every day | ORAL | 3 refills | Status: AC

## 2024-03-07 MED ORDER — CYCLOBENZAPRINE HCL 10 MG PO TABS: 10.0000 mg | ORAL_TABLET | Freq: Every evening | ORAL | 3 refills | Status: AC | PRN
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
## 2024-03-07 NOTE — Patient Instructions (Signed)
 Tremor A tremor is trembling or shaking that you cannot control. Most tremors affect the hands or arms. Tremors can also affect the head, vocal cords, face, and other parts of the body. There are many types of tremors. Common types include: Essential tremor. These usually occur in people older than 40. This type of tremor may run in families and can happen in otherwise healthy people. Resting tremor. These occur when the muscles are at rest, such as when your hands are resting in your lap. People with Parkinson's disease often have resting tremors. Postural tremor. These occur when you try to hold a pose, such as keeping your hands outstretched. Kinetic tremor. These occur during purposeful movement, such as trying to touch a finger to your nose. Task-specific tremor. These may occur when you do certain tasks such as writing, speaking, or standing. Psychogenic tremor. These are greatly reduced or go away when you are distracted. These tremors happen due to underlying stress or psychiatric disease. They can happen in people of all ages. Some types of tremors have no known cause. Tremors can also be a symptom of nervous system problems (neurological disorders) that may occur with aging. Some tremors go away with treatment, while others do not. Follow these instructions at home: Lifestyle     If you drink alcohol: Limit how much you have to: 0-1 drink a day for women who are not pregnant. 0-2 drinks a day for men. Know how much alcohol is in a drink. In the [place] one drink equals one 12 oz bottle of beer (355 mL), one 5 oz glass of wine (148 mL), or one 1 oz glass of hard liquor (44 mL). Do not use any products that contain nicotine or tobacco. These products include cigarettes, chewing tobacco, and vaping devices, such as e-cigarettes. If you need help quitting, ask your health care provider. Avoid extreme heat and extreme cold. Limit your caffeine intake, as told by your health care  provider. Try to get 8 hours of sleep each night. Find ways to manage your stress, such as meditation or yoga. General instructions Take over-the-counter and prescription medicines only as told by your health care provider. Keep all follow-up visits. This is important. Contact a health care provider if: You develop a tremor after starting a new medicine. You have a tremor along with other symptoms such as: Numbness. Tingling. Pain. Weakness. Your tremor gets worse. Your tremor interferes with your day-to-day life. Summary A tremor is trembling or shaking that you cannot control. Most tremors affect the hands or arms. Some types of tremors have no known cause. Others may be a symptom of nervous system problems (neurological disorders). Make sure you discuss any tremors you have with your health care provider. This information is not intended to replace advice given to you by your health care provider. Make sure you discuss any questions you have with your health care provider. Document Revised: 02/22/2021 Document Reviewed: 02/22/2021 Elsevier Patient Education  2024 [employer].

## 2024-03-08 ENCOUNTER — Encounter: Payer: Self-pay | Admitting: Neurology

## 2024-03-08 ENCOUNTER — Ambulatory Visit: Payer: Self-pay | Admitting: Family Medicine

## 2024-03-08 LAB — CBC WITH DIFFERENTIAL/PLATELET
Basophils Absolute: 0.1 x10E3/uL (ref 0.0–0.2)
Basos: 1 %
EOS (ABSOLUTE): 0.2 x10E3/uL (ref 0.0–0.4)
Eos: 3 %
Hematocrit: 45.8 % (ref 37.5–51.0)
Hemoglobin: 15.6 g/dL (ref 13.0–17.7)
Immature Grans (Abs): 0 x10E3/uL (ref 0.0–0.1)
Immature Granulocytes: 0 %
Lymphocytes Absolute: 1.7 x10E3/uL (ref 0.7–3.1)
Lymphs: 22 %
MCH: 32.9 pg (ref 26.6–33.0)
MCHC: 34.1 g/dL (ref 31.5–35.7)
MCV: 97 fL (ref 79–97)
Monocytes Absolute: 0.9 x10E3/uL (ref 0.1–0.9)
Monocytes: 11 %
Neutrophils Absolute: 5 x10E3/uL (ref 1.4–7.0)
Neutrophils: 63 %
Platelets: 235 x10E3/uL (ref 150–450)
RBC: 4.74 x10E6/uL (ref 4.14–5.80)
RDW: 12.5 % (ref 11.6–15.4)
WBC: 7.9 x10E3/uL (ref 3.4–10.8)

## 2024-03-08 LAB — LIPID PANEL
Chol/HDL Ratio: 3.3 ratio (ref 0.0–5.0)
Cholesterol, Total: 97 mg/dL — ABNORMAL LOW (ref 100–199)
HDL: 29 mg/dL — ABNORMAL LOW (ref >39)
LDL Chol Calc (NIH): 31 mg/dL (ref 0–99)
Triglycerides: 236 mg/dL — ABNORMAL HIGH (ref 0–149)
VLDL Cholesterol Cal: 37 mg/dL (ref 5–40)

## 2024-03-08 LAB — URIC ACID: Uric Acid: 4.8 mg/dL (ref 3.8–8.4)

## 2024-03-08 LAB — PSA: Prostate Specific Ag, Serum: 0.4 ng/mL (ref 0.0–4.0)

## 2024-03-08 LAB — TSH: TSH: 0.679 u[IU]/mL (ref 0.450–4.500)

## 2024-03-08 LAB — VITAMIN B12: Vitamin B-12: 805 pg/mL (ref 232–1245)

## 2024-03-15 ENCOUNTER — Encounter: Admitting: Family Medicine

## 2024-03-22 ENCOUNTER — Encounter: Payer: Self-pay | Admitting: Family Medicine

## 2024-03-22 ENCOUNTER — Ambulatory Visit: Payer: Self-pay

## 2024-03-22 VITALS — BP 136/85 | HR 99 | Temp 98.3°F | Ht 77.0 in | Wt 300.0 lb

## 2024-03-22 DIAGNOSIS — L97422 Non-pressure chronic ulcer of left heel and midfoot with fat layer exposed: Secondary | ICD-10-CM | POA: Diagnosis not present

## 2024-03-22 DIAGNOSIS — L97522 Non-pressure chronic ulcer of other part of left foot with fat layer exposed: Secondary | ICD-10-CM | POA: Diagnosis not present

## 2024-03-22 DIAGNOSIS — E11621 Type 2 diabetes mellitus with foot ulcer: Secondary | ICD-10-CM | POA: Diagnosis not present
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
## 2024-03-28 ENCOUNTER — Ambulatory Visit (INDEPENDENT_AMBULATORY_CARE_PROVIDER_SITE_OTHER): Admitting: Orthopedic Surgery

## 2024-03-28 DIAGNOSIS — M21372 Foot drop, left foot: Secondary | ICD-10-CM

## 2024-03-29 ENCOUNTER — Encounter: Payer: Self-pay | Admitting: Orthopedic Surgery
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
## 2024-03-31 ENCOUNTER — Other Ambulatory Visit: Payer: Self-pay | Admitting: "Endocrinology

## 2024-04-07 NOTE — Progress Notes (Signed)
 Assessment/Plan:   Assessment and Plan Assessment & Plan 1.  Bilateral hand tremor Tremor present for 8-10 months, affecting fine motor tasks.  Minimal present on examination today.  No Parkinson's disease. Possible contributors: diabetes, blood sugar fluctuations, medication effects. Bupropion  may worsen tremor; metoprolol  and gabapentin  may help. No additional medication needed as tremor not significantly disruptive. - Continue metoprolol  and gabapentin . - Monitor tremor impact on daily activities; consider medication if tremor becomes disruptive.  2.  Type 2 diabetes mellitus with diabetic polyneuropathy Type 2 diabetes with good glycemic control (A1c ~5). Diabetic polyneuropathy causing fingertip numbness and altered sensation. Neuropathy irreversible but stable with good diabetes control. Gabapentin  used for symptom management. - Continue gabapentin . - Monitor neuropathy symptoms; consider EMG if symptoms worsen or new symptoms develop.  3.  Left foot drop due to lumbar radiculopathy  - Patient apparently has previously seen surgery and surgery was not recommended.  - Last lumbar spine imaging available to me is in 2017.  At that point in time, patient did have moderate disc protrusion at L4-L5 and L5-S1  Subjective:   Discussed the use of AI scribe software for clinical note transcription with the patient, who gave verbal consent to proceed.  History of Present Illness Anthony Logan is a 63 year old male with diabetes mellitus and hypertension who presents with tremor. He is accompanied by his wife.  He has experienced tremors in both hands for at least eight to ten months, possibly longer. The tremor is most noticeable during tasks requiring fine motor coordination, such as screwing in a screw. It is frustrating but manageable, often requiring the use of both hands to complete tasks. There is no significant change in tremor with caffeine, alcohol, stress, or fatigue. He is  able to perform daily activities such as eating, drinking, tying shoes, and dressing without significant difficulty.  He has a history of diabetes mellitus diagnosed a year after his nephrectomy, with an initial A1c of 8, now well-controlled at 5.3 with the use of Ozempic  for the past two years. He experiences diabetic neuropathy, primarily affecting the fingertips, leading to numbness and reduced dexterity. He takes gabapentin  600 mg three times a day for neuropathy.  He has a history of hypertension and hyperlipidemia, for which he takes metoprolol , which may also help with tremor. He does not consume alcohol and quit smoking years ago.  He has a history of renal cell carcinoma, status post right nephrectomy. He also has lumbar radiculopathy resulting in left foot drop, for which he wears a leg brace. He has not had back surgery. He experienced a fall this morning after reaching for a brush post-shower, resulting in a sore back and a scratch on his arm. No recent falls prior to this incident and generally manages balance well despite the foot drop.  He has a history of sleep apnea but does not use a CPAP machine. He underwent a thyroid  biopsy in the past. His family history includes a living mother and a deceased father, with a sister and two brothers who are living. He has a 75 year old son who is healthy.     ALLERGIES:   Allergies  Allergen Reactions   Diltiazem  Swelling    Wt gain,swelling hands,feet,gum bleeding   Clonidine Derivatives Swelling   Procardia [Nifedipine] Swelling    Swelling on feet and legs    Betadine [Povidone-Iodine] Rash    CURRENT MEDICATIONS:  Current Outpatient Medications  Medication Instructions   Accu-Chek FastClix Lancets MISC USE  TO TEST GLUCOSE ONCE A DAY AS DIRECTED   acetaminophen  (TYLENOL ) 650 mg, Oral, Every 6 hours PRN   allopurinol  (ZYLOPRIM ) 300 mg, Oral, Daily   Alpha-Lipoic Acid 600 mg, Oral, Daily, For diabetic neuropathy   aspirin  81 mg,  Daily   atorvastatin  (LIPITOR) 20 mg, Oral, Daily   buPROPion  (WELLBUTRIN  XL) 300 mg, Oral, Daily   cholecalciferol (VITAMIN D ) 1,000 Units, Daily   colchicine  0.6 mg, Oral, Daily   cyclobenzaprine  (FLEXERIL ) 10 mg, Oral, At bedtime PRN   gabapentin  (NEURONTIN ) 600 mg, Oral, 3 times daily   hydrALAZINE  (APRESOLINE ) 100 mg, Oral, 3 times daily, **dose change   Kerendia  10 mg, Oral, Daily with breakfast   lisinopril  (ZESTRIL ) 40 mg, Oral, Daily, *Dose change   metoprolol  tartrate (LOPRESSOR ) 25 mg, Oral, 2 times daily   Multiple Vitamin (MULTIVITAMIN ADULT PO) 1 tablet, Daily   OZEMPIC , 2 MG/DOSE, 8 MG/3ML SOPN INJECT 2MG  SUBCUTANEOUSLY  ONCE WEEKLY (EVERY 7 DAYS) AS DIRECTED   sildenafil  (VIAGRA ) 100 mg, Oral, Daily PRN   Zinc 20 MG CAPS Take by mouth.    Objective:   PHYSICAL EXAMINATION:    VITALS:   Vitals:   04/12/24 0937  BP: (!) 159/93  Pulse: 82  SpO2: 97%  Weight: (!) 300 lb 12.8 oz (136.4 kg)  Height: 6' 5 (1.956 m)    GEN:  The patient appears stated age and is in NAD. HEENT:  Normocephalic, atraumatic.  The mucous membranes are moist. The superficial temporal arteries are without ropiness or tenderness. CV:  RRR Lungs:  CTAB Neck/HEME:  There are no carotid bruits bilaterally.  Neurological examination:  Orientation: The patient is alert and oriented x3.  Cranial nerves: There is good facial symmetry.  Extraocular muscles are intact. The visual fields are full to confrontational testing. The speech is fluent and clear. Soft palate rises symmetrically and there is no tongue deviation. Hearing is intact to conversational tone. Sensation: Sensation is intact to light touch throughout (facial, trunk, extremities). Vibration is decreased distally. There is no extinction with double simultaneous stimulation.  Motor: Strength is 5/5 in the bilateral upper and lower extremities with the exception of foot drop on the left.   Shoulder shrug is equal and symmetric.  There is  no pronator drift. Deep tendon reflexes: Deep tendon reflexes are 2/4 at the bilateral biceps, triceps, brachioradialis, 1/4 at the bilateral patella and absent at the bilateral achilles. Plantar responses are downgoing bilaterally.  Movement examination: Tone: There is normal tone in the bilateral upper extremities.  The tone in the lower extremities is normal.  Abnormal movements: no rest tremor.  No postural or intention tremor.  he has no difficulty with archimedes spirals.  Able to pour water  from one glass to another without spilling it.  Does have minor tremor when picks up the cups Coordination:  There is no decremation with RAM's, with any form of RAMS, including alternating supination and pronation of the forearm, hand opening and closing, finger taps, heel taps and toe taps.  Gait and Station: The patient pushes off to arise.  Gait is antalgic.      I have reviewed and interpreted the following labs independently   Chemistry      Component Value Date/Time   NA 143 10/27/2023 0812   K 4.8 10/27/2023 0812   CL 102 10/27/2023 0812   CO2 26 10/27/2023 0812   BUN 21 10/27/2023 0812   CREATININE 1.35 (H) 10/27/2023 0812   CREATININE 1.26 04/03/2022 1100  Component Value Date/Time   CALCIUM  9.8 10/27/2023 0812   ALKPHOS 88 10/27/2023 0812   AST 22 10/27/2023 0812   ALT 24 10/27/2023 0812   BILITOT 0.9 10/27/2023 0812      Lab Results  Component Value Date   TSH 0.679 03/07/2024   Lab Results  Component Value Date   WBC 7.9 03/07/2024   HGB 15.6 03/07/2024   HCT 45.8 03/07/2024   MCV 97 03/07/2024   PLT 235 03/07/2024    Lab Results  Component Value Date   HGBA1C 5.3 03/07/2024     Total time spent on today's visit was , including both face-to-face time and nonface-to-face time.  Time included that spent on review of records (prior notes available to me/labs/imaging if pertinent), discussing treatment and goals, answering patient's questions and  coordinating care.  Cc:  Jolinda Norene HERO, DO

## 2024-04-12 ENCOUNTER — Encounter: Payer: Self-pay | Admitting: Neurology

## 2024-04-12 ENCOUNTER — Ambulatory Visit (INDEPENDENT_AMBULATORY_CARE_PROVIDER_SITE_OTHER): Admitting: Neurology

## 2024-04-12 VITALS — BP 159/93 | HR 82 | Ht 77.0 in | Wt 300.8 lb

## 2024-04-12 DIAGNOSIS — M5416 Radiculopathy, lumbar region: Secondary | ICD-10-CM

## 2024-04-12 DIAGNOSIS — R251 Tremor, unspecified: Secondary | ICD-10-CM

## 2024-04-12 DIAGNOSIS — E1142 Type 2 diabetes mellitus with diabetic polyneuropathy: Secondary | ICD-10-CM

## 2024-04-12 NOTE — Patient Instructions (Addendum)
  VISIT SUMMARY: Today, you came in with your wife to discuss the tremors you've been experiencing in both hands for the past eight to ten months. We also reviewed your diabetes management, your history of hypertension, and your left foot drop due to lumbar radiculopathy. You had a fall this morning, but there were no serious injuries.  YOUR PLAN: -BILATERAL HAND TREMOR: You have been experiencing tremors in both hands, which are most noticeable during tasks requiring fine motor skills. This is not related to Parkinson's disease. The tremors could be due to diabetes, blood sugar fluctuations, or medication effects. We will continue your current medications, metoprolol  and gabapentin , as they may help with the tremor. No new medication is needed at this time, but we will monitor the impact of the tremor on your daily activities.  -TYPE 2 DIABETES MELLITUS WITH DIABETIC POLYNEUROPATHY: Your diabetes is well-controlled with an A1c of around 5. You have diabetic polyneuropathy, which causes numbness in your fingertips. This condition is stable and managed with gabapentin . We will continue your current treatment and monitor your symptoms. If your symptoms worsen or new symptoms develop, we may consider additional tests.  -LEFT FOOT DROP DUE TO LUMBAR RADICULOPATHY: You have chronic left foot drop due to lumbar radiculopathy, which is managed with a leg brace. Surgery is not needed at this time. We will continue to monitor your balance and foot drop symptoms.  INSTRUCTIONS: Please continue taking your current medications as prescribed. Use your leg brace as needed for your left foot drop. Monitor your symptoms, and if you notice any changes or worsening, please contact us . Follow up with us  if your tremor becomes more disruptive or if you experience any new symptoms related to your diabetes or neuropathy.                    Contains text generated by [employer].                                  Contains text generated by [employer].

## 2024-04-22 ENCOUNTER — Other Ambulatory Visit: Payer: Self-pay | Admitting: "Endocrinology

## 2024-04-22 ENCOUNTER — Telehealth: Payer: Self-pay | Admitting: "Endocrinology

## 2024-04-22 DIAGNOSIS — E1165 Type 2 diabetes mellitus with hyperglycemia: Secondary | ICD-10-CM

## 2024-04-22 NOTE — Telephone Encounter (Signed)
 Does pt need new labs for this appt and if so can you put the order in?

## 2024-04-27 ENCOUNTER — Telehealth: Payer: Self-pay | Admitting: Orthopedic Surgery

## 2024-04-27 NOTE — Telephone Encounter (Signed)
 Pt called and said that his insurance company needs letter of medical necessity for the [facility] CB# [phone]

## 2024-04-28 NOTE — Telephone Encounter (Signed)
 [facility] takes care of the insurance side of things. We do not do that in the office for [facility].

## 2024-04-29 LAB — COMPREHENSIVE METABOLIC PANEL WITH GFR
ALT: 23 IU/L (ref 0–44)
AST: 25 IU/L (ref 0–40)
Albumin: 4.2 g/dL (ref 3.9–4.9)
Alkaline Phosphatase: 100 IU/L (ref 47–123)
BUN/Creatinine Ratio: 15 (ref 10–24)
BUN: 22 mg/dL (ref 8–27)
Bilirubin Total: 0.6 mg/dL (ref 0.0–1.2)
CO2: 25 mmol/L (ref 20–29)
Calcium: 9.5 mg/dL (ref 8.6–10.2)
Chloride: 100 mmol/L (ref 96–106)
Creatinine, Ser: 1.48 mg/dL — ABNORMAL HIGH (ref 0.76–1.27)
Globulin, Total: 2.6 g/dL (ref 1.5–4.5)
Glucose: 106 mg/dL — ABNORMAL HIGH (ref 70–99)
Potassium: 4.7 mmol/L (ref 3.5–5.2)
Sodium: 141 mmol/L (ref 134–144)
Total Protein: 6.8 g/dL (ref 6.0–8.5)
eGFR: 53 mL/min/1.73 — ABNORMAL LOW (ref >59)

## 2024-04-29 LAB — MICROALBUMIN / CREATININE URINE RATIO
Creatinine, Urine: 111.3 mg/dL
Microalb/Creat Ratio: 510 mg/g{creat} — ABNORMAL HIGH (ref 0–29)
Microalbumin, Urine: 567.2 ug/mL

## 2024-04-29 LAB — FIB-4 W/REFLEX TO ELF
FIB-4 Index: 1.14 (ref 0.00–2.67)
Platelets: 288 x10E3/uL (ref 150–450)

## 2024-05-05 ENCOUNTER — Encounter: Payer: Self-pay | Admitting: "Endocrinology

## 2024-05-05 ENCOUNTER — Ambulatory Visit: Admitting: "Endocrinology

## 2024-05-05 VITALS — BP 116/72 | HR 89 | Resp 18 | Ht 76.0 in | Wt 299.2 lb

## 2024-05-05 DIAGNOSIS — Z7985 Long-term (current) use of injectable non-insulin antidiabetic drugs: Secondary | ICD-10-CM | POA: Diagnosis not present

## 2024-05-05 DIAGNOSIS — Z6836 Body mass index (BMI) 36.0-36.9, adult: Secondary | ICD-10-CM | POA: Diagnosis not present

## 2024-05-05 DIAGNOSIS — E1165 Type 2 diabetes mellitus with hyperglycemia: Secondary | ICD-10-CM

## 2024-05-05 DIAGNOSIS — E66812 Obesity, class 2: Secondary | ICD-10-CM | POA: Diagnosis not present

## 2024-05-05 DIAGNOSIS — I10 Essential (primary) hypertension: Secondary | ICD-10-CM

## 2024-05-05 DIAGNOSIS — E782 Mixed hyperlipidemia: Secondary | ICD-10-CM

## 2024-05-05 LAB — POCT GLYCOSYLATED HEMOGLOBIN (HGB A1C): Hemoglobin A1C: 5.6 % (ref 4.0–5.6)

## 2024-05-05 MED ORDER — KERENDIA 10 MG PO TABS: 10.0000 mg | ORAL_TABLET | Freq: Every day | ORAL | 1 refills | Status: AC
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
## 2024-05-05 NOTE — Patient Instructions (Signed)

## 2024-05-30 ENCOUNTER — Ambulatory Visit: Admitting: Orthopedic Surgery

## 2024-05-30 DIAGNOSIS — L97521 Non-pressure chronic ulcer of other part of left foot limited to breakdown of skin: Secondary | ICD-10-CM | POA: Diagnosis not present

## 2024-05-30 DIAGNOSIS — M2022 Hallux rigidus, left foot: Secondary | ICD-10-CM

## 2024-05-30 DIAGNOSIS — M21372 Foot drop, left foot: Secondary | ICD-10-CM | POA: Diagnosis not present

## 2024-05-30 DIAGNOSIS — M6702 Short Achilles tendon (acquired), left ankle: Secondary | ICD-10-CM | POA: Diagnosis not present

## 2024-05-31 ENCOUNTER — Encounter: Payer: Self-pay | Admitting: Orthopedic Surgery
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
## 2024-06-20 ENCOUNTER — Other Ambulatory Visit: Payer: Self-pay

## 2024-06-20 DIAGNOSIS — I712 Thoracic aortic aneurysm, without rupture, unspecified: Secondary | ICD-10-CM

## 2024-06-23 ENCOUNTER — Encounter: Payer: Self-pay | Admitting: Cardiology

## 2024-06-23 ENCOUNTER — Ambulatory Visit: Admitting: Cardiology

## 2024-06-23 ENCOUNTER — Other Ambulatory Visit: Payer: Self-pay | Admitting: "Endocrinology

## 2024-06-23 ENCOUNTER — Telehealth: Payer: Self-pay | Admitting: *Deleted

## 2024-06-23 VITALS — BP 152/92 | HR 83 | Ht 77.0 in | Wt 302.5 lb

## 2024-06-23 DIAGNOSIS — I351 Nonrheumatic aortic (valve) insufficiency: Secondary | ICD-10-CM

## 2024-06-23 DIAGNOSIS — I7121 Aneurysm of the ascending aorta, without rupture: Secondary | ICD-10-CM

## 2024-06-23 DIAGNOSIS — E782 Mixed hyperlipidemia: Secondary | ICD-10-CM

## 2024-06-23 DIAGNOSIS — I10 Essential (primary) hypertension: Secondary | ICD-10-CM | POA: Diagnosis not present

## 2024-06-23 NOTE — Progress Notes (Signed)
"  ° ° °  Cardiology Office Note  Date: 06/23/2024   ID: [name], DOB 27-Aug-1960, MRN [id number]  History of Present Illness: Anthony Logan is a 64 y.o. male last seen in August 2024.  He is here for a follow-up visit.  He does not report any chest pain or unusual shortness of breath with typical activities.  We went over his medications.  He reports compliance with current antihypertensive regimen.  Does check blood pressure periodically at home reporting 140s over 80s.  I did talk with him about considering a switch from Lopressor  to Coreg to see if this would provide better blood pressure control.  He indicated that he would continue to check blood pressure and follow-up with PCP for further discussion next month.  I reviewed his ECG today which shows sinus rhythm with prolonged PR interval, inferior Q waves as before.  He is overdue for follow-up chest CTA.  Physical Exam: VS:  BP (!) 152/92 (BP Location: Left Arm, Patient Position: Sitting, Cuff Size: Large)   Pulse 83   Ht 6' 5 (1.956 m)   Wt (!) 302 lb 8 oz (137.2 kg)   SpO2 96%   BMI 35.87 kg/m , BMI Body mass index is 35.87 kg/m.  Wt Readings from Last 3 Encounters:  06/23/24 (!) 302 lb 8 oz (137.2 kg)  05/05/24 299 lb 3.2 oz (135.7 kg)  04/12/24 (!) 300 lb 12.8 oz (136.4 kg)    General: Patient appears comfortable at rest. HEENT: Conjunctiva and lids normal. Neck: Supple, no elevated JVP or carotid bruits. Lungs: Clear to auscultation, nonlabored breathing at rest. Cardiac: Regular rate and rhythm, no S3 or significant systolic murmur. Extremities: No pitting edema.  ECG:  An ECG dated 11/13/2021 was personally reviewed today and demonstrated:  Sinus rhythm with prolonged PR interval, inferior Q waves.  Labwork: 03/07/2024: Hemoglobin 15.6; TSH 0.679 04/28/2024: ALT 23; AST 25; BUN 22; Creatinine, Ser 1.48; Platelets 288; Potassium 4.7; Sodium 141     Component Value Date/Time   CHOL 97 (L) 03/07/2024 1350    TRIG 236 (H) 03/07/2024 1350   HDL 29 (L) 03/07/2024 1350   CHOLHDL 3.3 03/07/2024 1350   LDLCALC 31 03/07/2024 1350   Other Studies Reviewed Today:  No interval cardiac testing for review today.  Assessment and Plan:  1.  Asymptomatic aortic root and ascending aortic dilatation, 4.8 cm and 4.2 cm respectively by chest CTA in July 2024.  Plan to follow-up with repeat chest CTA for surveillance.   2.  Tricuspid aortic valve with mild aortic regurgitation by follow-up echocardiogram in July 2023.   3.  Primary hypertension.  Blood pressure elevated today.  He will track blood pressure at home and follow-up with PCP in the next month as scheduled.  Consider switching Lopressor  to Coreg as next step.  He is otherwise on hydralazine  100 mg 3 times a day and lisinopril  40 mg daily.  4.  Mixed hyperlipidemia, on Lipitor 20 mg daily.  LDL 31 in October 2025.  Disposition:  Follow up 1 year.  Signed, Jayson JUDITHANN Sierras, M.D., F.A.C.C. [facility] "

## 2024-06-23 NOTE — Patient Instructions (Addendum)
 Medication Instructions:  Your physician recommends that you continue on your current medications as directed. Please refer to the Current Medication list given to you today.  Labwork: BMET for Chest Angio CT  Testing/Procedures: Schedule CT angio chest and aorta  Follow-Up: Your physician recommends that you schedule a follow-up appointment in: 1 year. You will receive a reminder call in about 8-10 months reminding you to schedule your appointment. If you don't receive this call, please contact our office.  Any Other Special Instructions Will Be Listed Below (If Applicable).  If you need a refill on your cardiac medications before your next appointment, please call your pharmacy.

## 2024-06-23 NOTE — Telephone Encounter (Signed)
 Advised to disregard previous instructions given for his CT. Advised that he CT he is having a CT Angio Chest & Aorta and he is not having a Coronary CTA in which was previously ordered and is correct. Advised that his instructions have been updated and he did not need to take lopressor  100 mg 2 hours before the CT Angio Chest & Aorta. Advised to disregard all the instructions given to him previously for a coronary cta. Advised that he did need lab work to check his kidney functioning before the CT. Verbalized understanding of plan.

## 2024-06-27 ENCOUNTER — Ambulatory Visit: Admitting: Orthopedic Surgery

## 2024-07-07 ENCOUNTER — Ambulatory Visit (HOSPITAL_COMMUNITY)

## 2024-07-19 ENCOUNTER — Ambulatory Visit: Admitting: Family Medicine

## 2024-11-03 ENCOUNTER — Ambulatory Visit: Admitting: "Endocrinology
